# Patient Record
Sex: Female | Born: 1935 | Race: Black or African American | Hispanic: No | Smoking: Former smoker
Health system: Southern US, Community
[De-identification: ages and names within clinical notes are randomized; demographics above are authoritative.]

## PROBLEM LIST (undated history)

## (undated) DIAGNOSIS — I739 Peripheral vascular disease, unspecified: Secondary | ICD-10-CM

## (undated) DIAGNOSIS — E785 Hyperlipidemia, unspecified: Secondary | ICD-10-CM

## (undated) DIAGNOSIS — M1990 Unspecified osteoarthritis, unspecified site: Secondary | ICD-10-CM

## (undated) DIAGNOSIS — F0390 Unspecified dementia without behavioral disturbance: Secondary | ICD-10-CM

## (undated) DIAGNOSIS — E875 Hyperkalemia: Secondary | ICD-10-CM

## (undated) DIAGNOSIS — K219 Gastro-esophageal reflux disease without esophagitis: Secondary | ICD-10-CM

## (undated) DIAGNOSIS — R627 Adult failure to thrive: Secondary | ICD-10-CM

## (undated) DIAGNOSIS — F062 Psychotic disorder with delusions due to known physiological condition: Secondary | ICD-10-CM

## (undated) DIAGNOSIS — R296 Repeated falls: Secondary | ICD-10-CM

## (undated) DIAGNOSIS — I442 Atrioventricular block, complete: Secondary | ICD-10-CM

## (undated) DIAGNOSIS — D638 Anemia in other chronic diseases classified elsewhere: Secondary | ICD-10-CM

## (undated) DIAGNOSIS — K8020 Calculus of gallbladder without cholecystitis without obstruction: Secondary | ICD-10-CM

## (undated) DIAGNOSIS — I10 Essential (primary) hypertension: Secondary | ICD-10-CM

## (undated) DIAGNOSIS — E871 Hypo-osmolality and hyponatremia: Secondary | ICD-10-CM

## (undated) HISTORY — DX: Peripheral vascular disease, unspecified: I73.9

## (undated) HISTORY — PX: OTHER SURGICAL HISTORY: SHX169

## (undated) HISTORY — PX: PACEMAKER INSERTION: SHX728

## (undated) HISTORY — DX: Calculus of gallbladder without cholecystitis without obstruction: K80.20

## (undated) HISTORY — DX: Essential (primary) hypertension: I10

## (undated) HISTORY — DX: Gastro-esophageal reflux disease without esophagitis: K21.9

## (undated) HISTORY — DX: Unspecified osteoarthritis, unspecified site: M19.90

## (undated) HISTORY — DX: Hyperlipidemia, unspecified: E78.5

---

## 2000-11-05 ENCOUNTER — Ambulatory Visit (HOSPITAL_COMMUNITY): Admission: RE | Admit: 2000-11-05 | Discharge: 2000-11-05 | Payer: Self-pay | Admitting: Gastroenterology

## 2000-12-07 ENCOUNTER — Inpatient Hospital Stay (HOSPITAL_COMMUNITY): Admission: EM | Admit: 2000-12-07 | Discharge: 2000-12-08 | Payer: Self-pay | Admitting: *Deleted

## 2000-12-07 ENCOUNTER — Encounter: Payer: Self-pay | Admitting: Internal Medicine

## 2000-12-08 ENCOUNTER — Encounter: Payer: Self-pay | Admitting: Internal Medicine

## 2003-04-06 ENCOUNTER — Encounter (INDEPENDENT_AMBULATORY_CARE_PROVIDER_SITE_OTHER): Payer: Self-pay | Admitting: Gastroenterology

## 2003-04-13 ENCOUNTER — Encounter (INDEPENDENT_AMBULATORY_CARE_PROVIDER_SITE_OTHER): Payer: Self-pay | Admitting: Gastroenterology

## 2003-05-05 ENCOUNTER — Encounter: Admission: RE | Admit: 2003-05-05 | Discharge: 2003-05-05 | Payer: Self-pay | Admitting: Internal Medicine

## 2003-11-22 ENCOUNTER — Ambulatory Visit: Payer: Self-pay | Admitting: Internal Medicine

## 2003-12-06 ENCOUNTER — Ambulatory Visit: Payer: Self-pay | Admitting: Internal Medicine

## 2003-12-08 ENCOUNTER — Ambulatory Visit: Payer: Self-pay | Admitting: Internal Medicine

## 2003-12-20 ENCOUNTER — Ambulatory Visit: Payer: Self-pay | Admitting: Internal Medicine

## 2003-12-22 ENCOUNTER — Ambulatory Visit: Payer: Self-pay

## 2003-12-22 ENCOUNTER — Ambulatory Visit: Payer: Self-pay | Admitting: Internal Medicine

## 2004-01-04 ENCOUNTER — Ambulatory Visit: Admission: RE | Admit: 2004-01-04 | Discharge: 2004-01-04 | Payer: Self-pay | Admitting: Internal Medicine

## 2004-01-19 ENCOUNTER — Ambulatory Visit: Payer: Self-pay

## 2004-01-23 ENCOUNTER — Ambulatory Visit: Payer: Self-pay | Admitting: Internal Medicine

## 2004-01-30 ENCOUNTER — Ambulatory Visit: Payer: Self-pay | Admitting: Internal Medicine

## 2004-02-13 ENCOUNTER — Ambulatory Visit: Payer: Self-pay

## 2004-03-13 ENCOUNTER — Ambulatory Visit: Payer: Self-pay | Admitting: Internal Medicine

## 2004-03-18 ENCOUNTER — Ambulatory Visit: Payer: Self-pay | Admitting: Internal Medicine

## 2004-03-25 ENCOUNTER — Ambulatory Visit: Payer: Self-pay | Admitting: Internal Medicine

## 2004-04-12 ENCOUNTER — Encounter: Admission: RE | Admit: 2004-04-12 | Discharge: 2004-04-12 | Payer: Self-pay | Admitting: Nephrology

## 2004-04-16 ENCOUNTER — Ambulatory Visit: Payer: Self-pay | Admitting: Internal Medicine

## 2004-04-23 ENCOUNTER — Ambulatory Visit: Payer: Self-pay | Admitting: Internal Medicine

## 2004-07-17 ENCOUNTER — Ambulatory Visit: Payer: Self-pay | Admitting: Internal Medicine

## 2004-07-24 ENCOUNTER — Ambulatory Visit: Payer: Self-pay | Admitting: Internal Medicine

## 2004-08-20 ENCOUNTER — Ambulatory Visit: Payer: Self-pay | Admitting: Internal Medicine

## 2004-09-13 ENCOUNTER — Ambulatory Visit: Payer: Self-pay | Admitting: Internal Medicine

## 2004-09-25 ENCOUNTER — Ambulatory Visit: Payer: Self-pay | Admitting: Internal Medicine

## 2004-10-24 ENCOUNTER — Ambulatory Visit: Payer: Self-pay | Admitting: Internal Medicine

## 2004-11-25 ENCOUNTER — Ambulatory Visit: Payer: Self-pay | Admitting: Internal Medicine

## 2004-12-04 ENCOUNTER — Ambulatory Visit: Payer: Self-pay | Admitting: Internal Medicine

## 2005-01-13 DIAGNOSIS — I739 Peripheral vascular disease, unspecified: Secondary | ICD-10-CM

## 2005-01-13 HISTORY — DX: Peripheral vascular disease, unspecified: I73.9

## 2005-02-26 ENCOUNTER — Ambulatory Visit: Payer: Self-pay | Admitting: Internal Medicine

## 2005-03-18 ENCOUNTER — Ambulatory Visit: Payer: Self-pay | Admitting: Internal Medicine

## 2005-03-19 ENCOUNTER — Ambulatory Visit: Payer: Self-pay | Admitting: Internal Medicine

## 2005-03-27 ENCOUNTER — Ambulatory Visit: Payer: Self-pay

## 2005-04-17 ENCOUNTER — Ambulatory Visit: Payer: Self-pay | Admitting: Cardiology

## 2005-04-30 ENCOUNTER — Ambulatory Visit: Payer: Self-pay | Admitting: Internal Medicine

## 2005-05-09 ENCOUNTER — Ambulatory Visit: Payer: Self-pay | Admitting: Family Medicine

## 2005-06-11 ENCOUNTER — Ambulatory Visit: Payer: Self-pay | Admitting: Internal Medicine

## 2005-06-20 ENCOUNTER — Ambulatory Visit: Payer: Self-pay | Admitting: Cardiology

## 2005-07-02 ENCOUNTER — Ambulatory Visit (HOSPITAL_COMMUNITY): Admission: RE | Admit: 2005-07-02 | Discharge: 2005-07-03 | Payer: Self-pay | Admitting: Cardiology

## 2005-07-02 ENCOUNTER — Ambulatory Visit: Payer: Self-pay | Admitting: Cardiology

## 2005-07-08 ENCOUNTER — Ambulatory Visit: Payer: Self-pay | Admitting: Cardiology

## 2005-07-11 ENCOUNTER — Ambulatory Visit (HOSPITAL_COMMUNITY): Admission: RE | Admit: 2005-07-11 | Discharge: 2005-07-11 | Payer: Self-pay | Admitting: Cardiology

## 2005-07-11 ENCOUNTER — Ambulatory Visit: Payer: Self-pay | Admitting: Cardiology

## 2005-07-24 ENCOUNTER — Ambulatory Visit: Payer: Self-pay | Admitting: Cardiology

## 2005-07-24 ENCOUNTER — Ambulatory Visit: Payer: Self-pay

## 2005-07-28 ENCOUNTER — Ambulatory Visit: Payer: Self-pay

## 2005-09-12 ENCOUNTER — Ambulatory Visit: Payer: Self-pay | Admitting: Internal Medicine

## 2005-09-18 ENCOUNTER — Ambulatory Visit: Payer: Self-pay | Admitting: Internal Medicine

## 2005-10-29 ENCOUNTER — Ambulatory Visit: Payer: Self-pay | Admitting: Cardiology

## 2005-11-09 ENCOUNTER — Ambulatory Visit: Payer: Self-pay | Admitting: Internal Medicine

## 2005-11-24 ENCOUNTER — Ambulatory Visit: Payer: Self-pay | Admitting: Internal Medicine

## 2005-12-08 ENCOUNTER — Ambulatory Visit: Payer: Self-pay | Admitting: Internal Medicine

## 2006-01-05 ENCOUNTER — Ambulatory Visit: Payer: Self-pay | Admitting: Internal Medicine

## 2006-02-02 ENCOUNTER — Ambulatory Visit: Payer: Self-pay | Admitting: Internal Medicine

## 2006-02-12 ENCOUNTER — Ambulatory Visit: Payer: Self-pay

## 2006-03-02 ENCOUNTER — Ambulatory Visit: Payer: Self-pay | Admitting: Internal Medicine

## 2006-03-02 LAB — CONVERTED CEMR LAB
LDL Cholesterol: 125 mg/dL — ABNORMAL HIGH (ref 0–99)
Total CHOL/HDL Ratio: 3.9

## 2006-03-30 ENCOUNTER — Ambulatory Visit: Payer: Self-pay | Admitting: Internal Medicine

## 2006-04-27 ENCOUNTER — Ambulatory Visit: Payer: Self-pay | Admitting: Internal Medicine

## 2006-05-25 ENCOUNTER — Ambulatory Visit: Payer: Self-pay | Admitting: Internal Medicine

## 2006-06-12 DIAGNOSIS — Z950 Presence of cardiac pacemaker: Secondary | ICD-10-CM

## 2006-06-12 DIAGNOSIS — J309 Allergic rhinitis, unspecified: Secondary | ICD-10-CM | POA: Insufficient documentation

## 2006-06-12 DIAGNOSIS — K219 Gastro-esophageal reflux disease without esophagitis: Secondary | ICD-10-CM

## 2006-06-12 DIAGNOSIS — I10 Essential (primary) hypertension: Secondary | ICD-10-CM | POA: Insufficient documentation

## 2006-06-17 ENCOUNTER — Ambulatory Visit: Payer: Self-pay | Admitting: Internal Medicine

## 2006-06-17 DIAGNOSIS — Z8679 Personal history of other diseases of the circulatory system: Secondary | ICD-10-CM

## 2006-06-17 DIAGNOSIS — I739 Peripheral vascular disease, unspecified: Secondary | ICD-10-CM | POA: Insufficient documentation

## 2006-06-22 ENCOUNTER — Ambulatory Visit: Payer: Self-pay | Admitting: Internal Medicine

## 2006-07-06 ENCOUNTER — Telehealth (INDEPENDENT_AMBULATORY_CARE_PROVIDER_SITE_OTHER): Payer: Self-pay | Admitting: *Deleted

## 2006-07-20 ENCOUNTER — Ambulatory Visit: Payer: Self-pay | Admitting: Internal Medicine

## 2006-07-31 ENCOUNTER — Ambulatory Visit: Payer: Self-pay

## 2006-08-17 ENCOUNTER — Ambulatory Visit: Payer: Self-pay | Admitting: Internal Medicine

## 2006-08-28 ENCOUNTER — Encounter: Payer: Self-pay | Admitting: Internal Medicine

## 2006-09-15 ENCOUNTER — Ambulatory Visit: Payer: Self-pay | Admitting: Internal Medicine

## 2006-09-16 LAB — CONVERTED CEMR LAB
ALT: 14 units/L (ref 0–35)
AST: 25 units/L (ref 0–37)
BUN: 21 mg/dL (ref 6–23)
Basophils Absolute: 0 10*3/uL (ref 0.0–0.1)
Basophils Relative: 0.6 % (ref 0.0–1.0)
CO2: 25 meq/L (ref 19–32)
Calcium: 9.1 mg/dL (ref 8.4–10.5)
Chloride: 105 meq/L (ref 96–112)
Creatinine, Ser: 1 mg/dL (ref 0.4–1.2)
HCT: 35 % — ABNORMAL LOW (ref 36.0–46.0)
MCHC: 34.3 g/dL (ref 30.0–36.0)
Monocytes Relative: 9.9 % (ref 3.0–11.0)
Potassium: 4 meq/L (ref 3.5–5.1)
RBC: 3.94 M/uL (ref 3.87–5.11)
RDW: 12.9 % (ref 11.5–14.6)
WBC: 6.9 10*3/uL (ref 4.5–10.5)

## 2006-09-18 ENCOUNTER — Ambulatory Visit: Payer: Self-pay | Admitting: Internal Medicine

## 2006-09-22 ENCOUNTER — Ambulatory Visit: Payer: Self-pay | Admitting: Internal Medicine

## 2006-09-29 ENCOUNTER — Ambulatory Visit: Payer: Self-pay | Admitting: Internal Medicine

## 2006-09-29 LAB — CONVERTED CEMR LAB
BUN: 20 mg/dL (ref 6–23)
Basophils Relative: 1 % (ref 0.0–1.0)
CO2: 26 meq/L (ref 19–32)
Calcium: 8.9 mg/dL (ref 8.4–10.5)
Creatinine, Ser: 0.9 mg/dL (ref 0.4–1.2)
Eosinophils Relative: 3.2 % (ref 0.0–5.0)
GFR calc Af Amer: 77 mL/min
GFR calc non Af Amer: 64 mL/min
Hemoglobin: 11.3 g/dL — ABNORMAL LOW (ref 12.0–15.0)
INR: 1 (ref 0.8–1.0)
Lymphocytes Relative: 24.7 % (ref 12.0–46.0)
Monocytes Absolute: 0.6 10*3/uL (ref 0.2–0.7)
Monocytes Relative: 7.4 % (ref 3.0–11.0)
Neutro Abs: 5.1 10*3/uL (ref 1.4–7.7)
Prothrombin Time: 12.2 s (ref 10.9–13.3)
RDW: 12.7 % (ref 11.5–14.6)
WBC: 8.1 10*3/uL (ref 4.5–10.5)
aPTT: 23.9 s (ref 21.7–29.8)

## 2006-10-02 ENCOUNTER — Ambulatory Visit (HOSPITAL_COMMUNITY): Admission: RE | Admit: 2006-10-02 | Discharge: 2006-10-02 | Payer: Self-pay | Admitting: Internal Medicine

## 2006-10-02 ENCOUNTER — Ambulatory Visit: Payer: Self-pay | Admitting: Internal Medicine

## 2006-10-12 ENCOUNTER — Ambulatory Visit: Payer: Self-pay | Admitting: Internal Medicine

## 2006-10-16 ENCOUNTER — Ambulatory Visit: Payer: Self-pay | Admitting: Cardiovascular Disease

## 2006-12-18 ENCOUNTER — Ambulatory Visit: Payer: Self-pay | Admitting: Internal Medicine

## 2006-12-18 DIAGNOSIS — E785 Hyperlipidemia, unspecified: Secondary | ICD-10-CM

## 2007-02-04 ENCOUNTER — Ambulatory Visit: Payer: Self-pay | Admitting: Internal Medicine

## 2007-03-02 ENCOUNTER — Ambulatory Visit: Payer: Self-pay | Admitting: Internal Medicine

## 2007-03-04 ENCOUNTER — Telehealth (INDEPENDENT_AMBULATORY_CARE_PROVIDER_SITE_OTHER): Payer: Self-pay | Admitting: *Deleted

## 2007-03-05 ENCOUNTER — Ambulatory Visit: Payer: Self-pay | Admitting: Internal Medicine

## 2007-03-05 DIAGNOSIS — R42 Dizziness and giddiness: Secondary | ICD-10-CM

## 2007-03-09 ENCOUNTER — Encounter: Payer: Self-pay | Admitting: Internal Medicine

## 2007-03-09 ENCOUNTER — Telehealth: Payer: Self-pay | Admitting: Internal Medicine

## 2007-03-09 ENCOUNTER — Telehealth (INDEPENDENT_AMBULATORY_CARE_PROVIDER_SITE_OTHER): Payer: Self-pay | Admitting: *Deleted

## 2007-03-09 LAB — CONVERTED CEMR LAB
Basophils Relative: 1.6 % — ABNORMAL HIGH (ref 0.0–1.0)
CO2: 28 meq/L (ref 19–32)
Chloride: 108 meq/L (ref 96–112)
Eosinophils Absolute: 0.2 10*3/uL (ref 0.0–0.6)
Eosinophils Relative: 3 % (ref 0.0–5.0)
GFR calc Af Amer: 46 mL/min
GFR calc non Af Amer: 38 mL/min
Glucose, Bld: 76 mg/dL (ref 70–99)
HCT: 35.1 % — ABNORMAL LOW (ref 36.0–46.0)
Lymphocytes Relative: 27.4 % (ref 12.0–46.0)
MCV: 90.1 fL (ref 78.0–100.0)
Neutro Abs: 4.3 10*3/uL (ref 1.4–7.7)
Neutrophils Relative %: 59.1 % (ref 43.0–77.0)
Sodium: 141 meq/L (ref 135–145)
VLDL: 10 mg/dL (ref 0–40)
WBC: 7.2 10*3/uL (ref 4.5–10.5)

## 2007-03-11 ENCOUNTER — Encounter (INDEPENDENT_AMBULATORY_CARE_PROVIDER_SITE_OTHER): Payer: Self-pay | Admitting: *Deleted

## 2007-03-22 ENCOUNTER — Ambulatory Visit: Payer: Self-pay | Admitting: Cardiology

## 2007-03-23 ENCOUNTER — Encounter: Payer: Self-pay | Admitting: Internal Medicine

## 2007-03-23 ENCOUNTER — Ambulatory Visit: Payer: Self-pay

## 2007-03-25 ENCOUNTER — Telehealth (INDEPENDENT_AMBULATORY_CARE_PROVIDER_SITE_OTHER): Payer: Self-pay | Admitting: *Deleted

## 2007-03-26 ENCOUNTER — Ambulatory Visit: Payer: Self-pay | Admitting: Internal Medicine

## 2007-04-19 ENCOUNTER — Telehealth (INDEPENDENT_AMBULATORY_CARE_PROVIDER_SITE_OTHER): Payer: Self-pay | Admitting: *Deleted

## 2007-04-20 ENCOUNTER — Telehealth (INDEPENDENT_AMBULATORY_CARE_PROVIDER_SITE_OTHER): Payer: Self-pay | Admitting: *Deleted

## 2007-04-21 ENCOUNTER — Encounter (INDEPENDENT_AMBULATORY_CARE_PROVIDER_SITE_OTHER): Payer: Self-pay | Admitting: *Deleted

## 2007-05-22 ENCOUNTER — Emergency Department (HOSPITAL_COMMUNITY): Admission: EM | Admit: 2007-05-22 | Discharge: 2007-05-22 | Payer: Self-pay | Admitting: Emergency Medicine

## 2007-05-31 ENCOUNTER — Encounter: Payer: Self-pay | Admitting: Internal Medicine

## 2007-05-31 ENCOUNTER — Ambulatory Visit: Payer: Self-pay | Admitting: Internal Medicine

## 2007-06-14 ENCOUNTER — Encounter (INDEPENDENT_AMBULATORY_CARE_PROVIDER_SITE_OTHER): Payer: Self-pay | Admitting: *Deleted

## 2007-06-15 ENCOUNTER — Ambulatory Visit: Payer: Self-pay | Admitting: Internal Medicine

## 2007-06-15 DIAGNOSIS — M810 Age-related osteoporosis without current pathological fracture: Secondary | ICD-10-CM | POA: Insufficient documentation

## 2007-06-28 ENCOUNTER — Emergency Department (HOSPITAL_COMMUNITY): Admission: EM | Admit: 2007-06-28 | Discharge: 2007-06-28 | Payer: Self-pay | Admitting: Emergency Medicine

## 2007-07-01 ENCOUNTER — Telehealth: Payer: Self-pay | Admitting: Internal Medicine

## 2007-08-30 ENCOUNTER — Encounter: Payer: Self-pay | Admitting: Internal Medicine

## 2007-09-01 ENCOUNTER — Encounter (INDEPENDENT_AMBULATORY_CARE_PROVIDER_SITE_OTHER): Payer: Self-pay | Admitting: *Deleted

## 2007-10-18 ENCOUNTER — Ambulatory Visit: Payer: Self-pay | Admitting: Internal Medicine

## 2007-10-18 DIAGNOSIS — M1990 Unspecified osteoarthritis, unspecified site: Secondary | ICD-10-CM | POA: Insufficient documentation

## 2007-10-26 ENCOUNTER — Encounter (INDEPENDENT_AMBULATORY_CARE_PROVIDER_SITE_OTHER): Payer: Self-pay | Admitting: *Deleted

## 2007-10-26 LAB — CONVERTED CEMR LAB
CO2: 28 meq/L (ref 19–32)
GFR calc Af Amer: 61 mL/min
GFR calc non Af Amer: 50 mL/min
Glucose, Bld: 105 mg/dL — ABNORMAL HIGH (ref 70–99)
Potassium: 5 meq/L (ref 3.5–5.1)
Sed Rate: 44 mm/hr — ABNORMAL HIGH (ref 0–22)
Sodium: 140 meq/L (ref 135–145)

## 2007-11-03 ENCOUNTER — Ambulatory Visit: Payer: Self-pay | Admitting: Internal Medicine

## 2007-11-09 ENCOUNTER — Telehealth (INDEPENDENT_AMBULATORY_CARE_PROVIDER_SITE_OTHER): Payer: Self-pay | Admitting: *Deleted

## 2007-11-16 ENCOUNTER — Ambulatory Visit: Payer: Self-pay | Admitting: Cardiovascular Disease

## 2007-11-16 ENCOUNTER — Encounter (INDEPENDENT_AMBULATORY_CARE_PROVIDER_SITE_OTHER): Payer: Self-pay | Admitting: *Deleted

## 2007-12-02 ENCOUNTER — Encounter: Admission: RE | Admit: 2007-12-02 | Discharge: 2008-01-13 | Payer: Self-pay | Admitting: Internal Medicine

## 2007-12-10 ENCOUNTER — Encounter: Payer: Self-pay | Admitting: Internal Medicine

## 2008-02-21 ENCOUNTER — Telehealth (INDEPENDENT_AMBULATORY_CARE_PROVIDER_SITE_OTHER): Payer: Self-pay | Admitting: *Deleted

## 2008-02-22 ENCOUNTER — Encounter: Payer: Self-pay | Admitting: Internal Medicine

## 2008-03-03 ENCOUNTER — Ambulatory Visit: Payer: Self-pay | Admitting: Internal Medicine

## 2008-03-07 ENCOUNTER — Telehealth (INDEPENDENT_AMBULATORY_CARE_PROVIDER_SITE_OTHER): Payer: Self-pay | Admitting: *Deleted

## 2008-03-07 LAB — CONVERTED CEMR LAB
AST: 28 units/L (ref 0–37)
Basophils Absolute: 0.1 10*3/uL (ref 0.0–0.1)
Cholesterol: 187 mg/dL (ref 0–200)
LDL Cholesterol: 125 mg/dL — ABNORMAL HIGH (ref 0–99)
Lymphocytes Relative: 23 % (ref 12.0–46.0)
MCHC: 33.9 g/dL (ref 30.0–36.0)
Neutrophils Relative %: 65 % (ref 43.0–77.0)
RBC: 3.98 M/uL (ref 3.87–5.11)
RDW: 13.2 % (ref 11.5–14.6)
TSH: 1.6 microintl units/mL (ref 0.35–5.50)
Total CHOL/HDL Ratio: 3.7
Triglycerides: 60 mg/dL (ref 0–149)
VLDL: 12 mg/dL (ref 0–40)

## 2008-03-09 ENCOUNTER — Ambulatory Visit: Payer: Self-pay | Admitting: Internal Medicine

## 2008-03-13 ENCOUNTER — Telehealth (INDEPENDENT_AMBULATORY_CARE_PROVIDER_SITE_OTHER): Payer: Self-pay | Admitting: *Deleted

## 2008-03-14 LAB — CONVERTED CEMR LAB: Vit D, 25-Hydroxy: 46 ng/mL (ref 30–89)

## 2008-03-23 ENCOUNTER — Ambulatory Visit: Payer: Self-pay

## 2008-03-23 ENCOUNTER — Encounter: Payer: Self-pay | Admitting: Internal Medicine

## 2008-04-04 ENCOUNTER — Telehealth: Payer: Self-pay | Admitting: Internal Medicine

## 2008-04-05 ENCOUNTER — Encounter (INDEPENDENT_AMBULATORY_CARE_PROVIDER_SITE_OTHER): Payer: Self-pay | Admitting: *Deleted

## 2008-04-11 ENCOUNTER — Encounter: Payer: Self-pay | Admitting: Internal Medicine

## 2008-05-03 DIAGNOSIS — I442 Atrioventricular block, complete: Secondary | ICD-10-CM | POA: Insufficient documentation

## 2008-05-03 DIAGNOSIS — I498 Other specified cardiac arrhythmias: Secondary | ICD-10-CM

## 2008-08-09 ENCOUNTER — Ambulatory Visit: Payer: Self-pay | Admitting: Internal Medicine

## 2008-08-14 ENCOUNTER — Telehealth (INDEPENDENT_AMBULATORY_CARE_PROVIDER_SITE_OTHER): Payer: Self-pay | Admitting: *Deleted

## 2008-08-17 ENCOUNTER — Ambulatory Visit: Payer: Self-pay

## 2008-09-01 ENCOUNTER — Ambulatory Visit: Payer: Self-pay | Admitting: Internal Medicine

## 2008-10-11 ENCOUNTER — Telehealth: Payer: Self-pay | Admitting: Internal Medicine

## 2008-10-13 ENCOUNTER — Ambulatory Visit: Payer: Self-pay | Admitting: Internal Medicine

## 2008-10-18 ENCOUNTER — Telehealth (INDEPENDENT_AMBULATORY_CARE_PROVIDER_SITE_OTHER): Payer: Self-pay | Admitting: *Deleted

## 2008-10-24 ENCOUNTER — Ambulatory Visit: Payer: Self-pay | Admitting: Internal Medicine

## 2008-10-24 ENCOUNTER — Telehealth: Payer: Self-pay | Admitting: Internal Medicine

## 2008-10-24 LAB — CONVERTED CEMR LAB
Ketones, urine, test strip: NEGATIVE
Nitrite: NEGATIVE
Specific Gravity, Urine: 1.015
Urobilinogen, UA: 0.2
pH: 6

## 2008-11-13 ENCOUNTER — Ambulatory Visit: Payer: Self-pay | Admitting: Internal Medicine

## 2008-11-15 ENCOUNTER — Encounter: Payer: Self-pay | Admitting: Internal Medicine

## 2008-11-16 ENCOUNTER — Telehealth (INDEPENDENT_AMBULATORY_CARE_PROVIDER_SITE_OTHER): Payer: Self-pay | Admitting: *Deleted

## 2008-11-16 LAB — CONVERTED CEMR LAB
ALT: 10 units/L (ref 0–35)
AST: 31 units/L (ref 0–37)
Albumin: 4.2 g/dL (ref 3.5–5.2)
Alkaline Phosphatase: 63 units/L (ref 39–117)
Basophils Absolute: 0.1 10*3/uL (ref 0.0–0.1)
Calcium: 9.4 mg/dL (ref 8.4–10.5)
Creatinine, Ser: 1.1 mg/dL (ref 0.4–1.2)
Eosinophils Absolute: 0.1 10*3/uL (ref 0.0–0.7)
GFR calc non Af Amer: 60.75 mL/min (ref >60)
Lymphocytes Relative: 31.6 % (ref 12.0–46.0)
MCHC: 34.7 g/dL (ref 30.0–36.0)
Monocytes Relative: 3.3 % (ref 3.0–12.0)
Neutro Abs: 4 10*3/uL (ref 1.4–7.7)
Neutrophils Relative %: 62 % (ref 43.0–77.0)
RBC: 3.84 M/uL — ABNORMAL LOW (ref 3.87–5.11)
Total Protein: 7.8 g/dL (ref 6.0–8.3)
WBC: 6.5 10*3/uL (ref 4.5–10.5)

## 2008-12-13 ENCOUNTER — Ambulatory Visit: Payer: Self-pay | Admitting: Internal Medicine

## 2008-12-13 DIAGNOSIS — K8020 Calculus of gallbladder without cholecystitis without obstruction: Secondary | ICD-10-CM | POA: Insufficient documentation

## 2009-01-01 ENCOUNTER — Telehealth (INDEPENDENT_AMBULATORY_CARE_PROVIDER_SITE_OTHER): Payer: Self-pay | Admitting: *Deleted

## 2009-03-07 ENCOUNTER — Ambulatory Visit: Payer: Self-pay | Admitting: Internal Medicine

## 2009-03-27 ENCOUNTER — Ambulatory Visit: Payer: Self-pay | Admitting: Internal Medicine

## 2009-04-23 ENCOUNTER — Emergency Department (HOSPITAL_COMMUNITY): Admission: EM | Admit: 2009-04-23 | Discharge: 2009-04-23 | Payer: Self-pay | Admitting: Emergency Medicine

## 2009-04-24 ENCOUNTER — Telehealth (INDEPENDENT_AMBULATORY_CARE_PROVIDER_SITE_OTHER): Payer: Self-pay | Admitting: *Deleted

## 2009-04-24 ENCOUNTER — Ambulatory Visit: Payer: Self-pay | Admitting: Internal Medicine

## 2009-04-24 DIAGNOSIS — S20229A Contusion of unspecified back wall of thorax, initial encounter: Secondary | ICD-10-CM | POA: Insufficient documentation

## 2009-04-24 DIAGNOSIS — D649 Anemia, unspecified: Secondary | ICD-10-CM

## 2009-04-29 LAB — CONVERTED CEMR LAB
Basophils Absolute: 0 10*3/uL (ref 0.0–0.1)
Eosinophils Absolute: 0.2 10*3/uL (ref 0.0–0.7)
HCT: 32.6 % — ABNORMAL LOW (ref 36.0–46.0)
Hemoglobin: 11.1 g/dL — ABNORMAL LOW (ref 12.0–15.0)
Iron: 49 ug/dL (ref 42–145)
Lymphs Abs: 1.4 10*3/uL (ref 0.7–4.0)
MCHC: 34 g/dL (ref 30.0–36.0)
MCV: 89.2 fL (ref 78.0–100.0)
Monocytes Absolute: 0.3 10*3/uL (ref 0.1–1.0)
Neutro Abs: 1.5 10*3/uL (ref 1.4–7.7)
RDW: 14.5 % (ref 11.5–14.6)

## 2009-04-30 ENCOUNTER — Telehealth (INDEPENDENT_AMBULATORY_CARE_PROVIDER_SITE_OTHER): Payer: Self-pay | Admitting: *Deleted

## 2009-05-28 ENCOUNTER — Ambulatory Visit: Payer: Self-pay | Admitting: Internal Medicine

## 2009-05-30 LAB — CONVERTED CEMR LAB
Hemoglobin: 11.3 g/dL — ABNORMAL LOW (ref 12.0–15.0)
Total CK: 109 units/L (ref 7–177)

## 2009-06-04 ENCOUNTER — Encounter: Payer: Self-pay | Admitting: Internal Medicine

## 2009-06-13 ENCOUNTER — Encounter: Admission: RE | Admit: 2009-06-13 | Discharge: 2009-09-03 | Payer: Self-pay | Admitting: Family Medicine

## 2009-06-15 ENCOUNTER — Telehealth: Payer: Self-pay | Admitting: Internal Medicine

## 2009-06-15 ENCOUNTER — Ambulatory Visit: Payer: Self-pay | Admitting: Internal Medicine

## 2009-06-28 ENCOUNTER — Ambulatory Visit: Payer: Self-pay | Admitting: Internal Medicine

## 2009-06-28 ENCOUNTER — Telehealth (INDEPENDENT_AMBULATORY_CARE_PROVIDER_SITE_OTHER): Payer: Self-pay | Admitting: *Deleted

## 2009-07-04 ENCOUNTER — Telehealth: Payer: Self-pay | Admitting: Internal Medicine

## 2009-07-12 ENCOUNTER — Encounter: Payer: Self-pay | Admitting: Internal Medicine

## 2009-07-13 ENCOUNTER — Encounter: Payer: Self-pay | Admitting: Internal Medicine

## 2009-07-17 ENCOUNTER — Encounter: Payer: Self-pay | Admitting: Internal Medicine

## 2009-07-19 ENCOUNTER — Ambulatory Visit: Payer: Self-pay | Admitting: Internal Medicine

## 2009-07-23 ENCOUNTER — Telehealth: Payer: Self-pay | Admitting: Internal Medicine

## 2009-07-31 ENCOUNTER — Encounter: Payer: Self-pay | Admitting: Internal Medicine

## 2009-07-31 ENCOUNTER — Telehealth: Payer: Self-pay | Admitting: Internal Medicine

## 2009-08-01 ENCOUNTER — Ambulatory Visit: Payer: Self-pay | Admitting: Internal Medicine

## 2009-08-15 ENCOUNTER — Encounter: Payer: Self-pay | Admitting: Internal Medicine

## 2009-08-15 ENCOUNTER — Ambulatory Visit: Payer: Self-pay | Admitting: Family Medicine

## 2009-08-31 ENCOUNTER — Encounter: Payer: Self-pay | Admitting: Internal Medicine

## 2009-08-31 LAB — CONVERTED CEMR LAB
ALT: 13 units/L
AST: 31 units/L
B-12, serum: 221 pg/mL
Creatinine, Ser: 1 mg/dL
Glucose, Bld: 83 mg/dL
Hgb A1c MFr Bld: 5.3 %
Potassium, serum: 4.3 mmol/L
TSH: 1.26 microintl units/mL
Total Protein: 7.7 g/dL

## 2009-09-19 ENCOUNTER — Ambulatory Visit: Payer: Self-pay | Admitting: Internal Medicine

## 2009-09-19 DIAGNOSIS — E538 Deficiency of other specified B group vitamins: Secondary | ICD-10-CM

## 2009-09-20 ENCOUNTER — Telehealth: Payer: Self-pay | Admitting: Internal Medicine

## 2009-10-10 ENCOUNTER — Encounter: Payer: Self-pay | Admitting: Internal Medicine

## 2009-10-16 ENCOUNTER — Telehealth: Payer: Self-pay | Admitting: Internal Medicine

## 2009-11-05 ENCOUNTER — Ambulatory Visit: Payer: Self-pay | Admitting: Internal Medicine

## 2009-11-06 ENCOUNTER — Telehealth: Payer: Self-pay | Admitting: Internal Medicine

## 2009-11-12 ENCOUNTER — Telehealth: Payer: Self-pay | Admitting: Internal Medicine

## 2009-12-10 ENCOUNTER — Ambulatory Visit: Payer: Self-pay | Admitting: Internal Medicine

## 2010-01-16 ENCOUNTER — Encounter: Payer: Self-pay | Admitting: Internal Medicine

## 2010-01-16 ENCOUNTER — Ambulatory Visit: Admission: RE | Admit: 2010-01-16 | Discharge: 2010-01-16 | Payer: Self-pay | Source: Home / Self Care

## 2010-01-22 ENCOUNTER — Ambulatory Visit
Admission: RE | Admit: 2010-01-22 | Discharge: 2010-01-22 | Payer: Self-pay | Source: Home / Self Care | Attending: Internal Medicine | Admitting: Internal Medicine

## 2010-01-22 ENCOUNTER — Other Ambulatory Visit: Payer: Self-pay | Admitting: Internal Medicine

## 2010-01-22 LAB — BASIC METABOLIC PANEL
BUN: 21 mg/dL (ref 6–23)
CO2: 27 mEq/L (ref 19–32)
Calcium: 9.3 mg/dL (ref 8.4–10.5)
Chloride: 105 mEq/L (ref 96–112)
Creatinine, Ser: 0.9 mg/dL (ref 0.4–1.2)
GFR: 72.62 mL/min (ref >60.00)
Glucose, Bld: 91 mg/dL (ref 70–99)
Potassium: 4.6 mEq/L (ref 3.5–5.1)
Sodium: 137 mEq/L (ref 135–145)

## 2010-01-29 ENCOUNTER — Telehealth: Payer: Self-pay | Admitting: Internal Medicine

## 2010-01-31 ENCOUNTER — Telehealth: Payer: Self-pay | Admitting: Internal Medicine

## 2010-02-03 ENCOUNTER — Encounter: Payer: Self-pay | Admitting: Internal Medicine

## 2010-02-10 LAB — CONVERTED CEMR LAB
Basophils Relative: 0.6 % (ref 0.0–3.0)
CO2: 30 meq/L (ref 19–32)
Calcium: 9.3 mg/dL (ref 8.4–10.5)
Chloride: 106 meq/L (ref 96–112)
Creatinine, Ser: 1.1 mg/dL (ref 0.4–1.2)
Eosinophils Relative: 3.3 % (ref 0.0–5.0)
Glucose, Bld: 73 mg/dL (ref 70–99)
Hemoglobin: 11.7 g/dL — ABNORMAL LOW (ref 12.0–15.0)
Lymphocytes Relative: 27.5 % (ref 12.0–46.0)
MCHC: 34 g/dL (ref 30.0–36.0)
MCV: 90.1 fL (ref 78.0–100.0)
Monocytes Relative: 6.5 % (ref 3.0–12.0)
Neutro Abs: 3.7 10*3/uL (ref 1.4–7.7)
RBC: 3.82 M/uL — ABNORMAL LOW (ref 3.87–5.11)
RDW: 15.2 % — ABNORMAL HIGH (ref 11.5–14.6)
Sodium: 144 meq/L (ref 135–145)

## 2010-02-12 NOTE — Letter (Signed)
[facility]   Imported By: Lanelle Bal 11/14/2009 11:40:29  _____________________________________________________________________  External Attachment:    Type:   Image     Comment:   External Document

## 2010-02-12 NOTE — Assessment & Plan Note (Signed)
Summary: discuss back pain/referral?/drb   Vital Signs:  Patient profile:   77 year old female Weight:      144.50 pounds Pulse rate:   80 / minute Pulse rhythm:   regular BP sitting:   134 / 82  (left arm) Cuff size:   regular  Vitals Entered By: Army Fossa CMA (November 05, 2009 2:11 PM) CC: Pt here to discuss back pain.  Comments getting worse, feels in her legs and arms  not fasting [facility] [place] flu shot    History of Present Illness: here  w/ her nephew [name]  continue with generalized back pain, shoulder pain and pain in the left iliac crest when she bends forward.  also, have a lot of dizzines , saw neuro, w/u done, results? She likes me  to explain all the results of neuro work up to her  Current Medications (verified): 1)  Amlodipine Besylate 5 Mg Tabs (Amlodipine Besylate) .Marland Kitchen.. 1 By Mouth Once Daily 2)  Calcium and Vit D Every Day 3)  Prilosec Otc 20 Mg Tbec (Omeprazole Magnesium) .Marland Kitchen.. 1 By Mouth Once Daily 4)  B12 1000 Mcg .... Daily 5)  Baby Aspirin 81 Mg Chew (Aspirin) .Marland Kitchen.. 1 By Mouth Daily 6)  Advil 200 Mg Tabs (Ibuprofen) .... As Needed 7)  Fiber Caps  Allergies: 1)  ! Hydrocodone 2)  * Penicillins Group  Past History:  Past Medical History: Reviewed history from 06/28/2009 and no changes required. u/s 11-2008: (-) for AAA, does have GB stones Hypertension Hyperlipidemia Cardiomyopathy PACEMAKER, PERMANENT (ICD-V45.01) [employer] [phone]  PVD-- stenting b legs 2007 last carotid u/s 3-10  stable  Allergic rhinitis Diverticulosis, colon GERD Osteoporosis OA   End of life issues discussed 03-03-08: does not like heroic measures, intubation or CPR  Past Surgical History: Reviewed history from 06/12/2006 and no changes required. Permanent pacemaker Breast biopsy benign  Social History: Reviewed history from 03/07/2009 and no changes required. cames to the office frecuently w/ her nephew [name] lives by self ADL  independent still drives no children has family around Tobacco Use - Former.  quit age 86 ETOH--no  Retired  Widowed  Alcohol Use - no  Review of Systems       no fever, headache No weight loss  Physical Exam  General:  alert and well-developed.   Lungs:  normal respiratory effort, no intercostal retractions, no accessory muscle use, and normal breath sounds.   Heart:  normal rate, regular rhythm, no murmur, no gallop, and no rub.   Extremities:  no edema   Impression & Recommendations:  Problem # 1:  OSTEOARTHRITIS (ICD-715.90) continuous  generalized aches and pains likely from DJD She is not on statins workup included: 05-2009: Sedimentation rate was 34 (essentially normal), CK normal again, I told her that I cannot "cure" her DJD but we can make her better with pain medication She has consistently declined to take narcotics   The following medications were removed from the medication list:    Advil 200 Mg Tabs (Ibuprofen) .Marland Kitchen... As needed Her updated medication list for this problem includes:    Baby Aspirin 81 Mg Chew (Aspirin) .Marland Kitchen... 1 by mouth daily  Problem # 2:  DIZZINESS (ICD-780.4) patient likes me to find out about her carotid  ultrasound and nerve conduction study. We'll call neurology  Problem # 3:  OSTEOPOROSIS (ICD-733.00) last bone density 8-11--osteoporosis She does not like to take oral biphosphonate, IV biphosphonate very extensive, can't afford All her alternative options are equally  or more extensive. Recommend: Calcium, vitamin D, exercise.   Complete Medication List: 1)  Amlodipine Besylate 5 Mg Tabs (Amlodipine besylate) .Marland Kitchen.. 1 by mouth once daily 2)  Calcium and Vit D Every Day  3)  Prilosec Otc 20 Mg Tbec (Omeprazole magnesium) .Marland Kitchen.. 1 by mouth once daily 4)  B12 1000 Mcg  .... Daily 5)  Baby Aspirin 81 Mg Chew (Aspirin) .Marland Kitchen.. 1 by mouth daily 6)  Fiber Caps   Other Orders: Flu Vaccine 81yrs + MEDICARE PATIENTS (E4540) Administration Flu  vaccine - MCR (J8119)  Patient Instructions: 1)  Please schedule a follow-up appointment in 4 months .    Orders Added: 1)  Flu Vaccine 5yrs + MEDICARE PATIENTS [Q2039] 2)  Administration Flu vaccine - MCR [G0008] 3)  Est. Patient Level III [14782] Flu Vaccine Consent Questions     Do you have a history of severe allergic reactions to this vaccine? no    Any prior history of allergic reactions to egg and/or gelatin? no    Do you have a sensitivity to the preservative Thimersol? no    Do you have a past history of Guillan-Barre Syndrome? no    Do you currently have an acute febrile illness? no    Have you ever had a severe reaction to latex? no    Vaccine information given and explained to patient? yes    Are you currently pregnant? no    Lot Number:[id number]   Exp Date:07/13/2010   Site Given  right Deltoid IMcine 92yrs + MEDICARE PATIENTS [Q2039] 2)  Administration Flu vaccine - MCR [G0008]     .lbmedflu

## 2010-02-12 NOTE — Cardiovascular Report (Signed)
Summary: Office Visit   Office Visit   Imported By: Roderic Ovens 03/30/2009 16:19:57  _____________________________________________________________________  External Attachment:    Type:   Image     Comment:   External Document

## 2010-02-12 NOTE — Consult Note (Signed)
Summary: [facility]   Imported By: Lanelle Bal 06/16/2009 11:02:35  _____________________________________________________________________  External Attachment:    Type:   Image     Comment:   External Document

## 2010-02-12 NOTE — Progress Notes (Signed)
  Phone Note Call from Patient Call back at Home Phone [phone]   Summary of Call: Patient left message on VM on 4/11 (incorrect VM - Lisa).  Stating that she fell over the weekend & is having pain.  Wants to know if she should come in???  Called pt this am -- scheduled appt for this afternoon [facility]  April 24, 2009 8:55 AM

## 2010-02-12 NOTE — Assessment & Plan Note (Signed)
Summary: 1 MONTH FOLLOWUP///SPH   Vital Signs:  Patient profile:   77 year old female Height:      64 inches Weight:      144 pounds Temp:     98.2 degrees F oral Pulse rate:   68 / minute BP sitting:   140 / 70  (left arm)  Vitals Entered By: Jeremy Johann CMA (June 28, 2009 10:18 AM) CC: 1 month f/u  Comments --hip pain --fasting REVIEWED MED LIST, PATIENT AGREED DOSE AND INSTRUCTION CORRECT    History of Present Illness: here to report that she is doing physical therapy and needs a form signed. No further falls  Allergies: 1)  ! Hydrocodone 2)  * Penicillins Group  Past History:  Past Medical History: u/s 11-2008: (-) for AAA, does have GB stones Hypertension Hyperlipidemia Cardiomyopathy PACEMAKER, PERMANENT (ICD-V45.01) [employer] [phone]  PVD-- stenting b legs 2007 last carotid u/s 3-10  stable  Allergic rhinitis Diverticulosis, colon GERD Osteoporosis OA   End of life issues discussed 03-03-08: does not like heroic measures, intubation or CPR  Past Surgical History: Reviewed history from 06/12/2006 and no changes required. Permanent pacemaker Breast biopsy benign  Review of Systems       denies chest pain, shortness of breath No nausea vomiting or diarrhea  Physical Exam  General:  alert and well-developed.   Lungs:  normal respiratory effort, no intercostal retractions, no accessory muscle use, and normal breath sounds.   Heart:  normal rate, regular rhythm, no murmur, no gallop, and no rub.     Impression & Recommendations:  Problem # 1:  ACCIDENTAL FALLS, RECURRENT (ICD-E888.9) doing physical therapy, will bring a form to be signed.  Problem # 2:  DIZZINESS (ICD-780.4) to be seen by cardiology 07-19-09, patient aware  Complete Medication List: 1)  Amlodipine Besylate 5 Mg Tabs (Amlodipine besylate) .Marland Kitchen.. 1 by mouth once daily 2)  Bayer Aspirin Ec Low Dose 81 Mg Tbec (Aspirin) .Marland Kitchen.. 1 a   day 3)  Calcium and Vit D Every Day  4)   Prilosec Otc 20 Mg Tbec (Omeprazole magnesium) .Marland Kitchen.. 1 by mouth once daily  Patient Instructions: 1)  Please schedule a follow-up appointment in 3 months .

## 2010-02-12 NOTE — Assessment & Plan Note (Signed)
Summary: fell/swh   Vital Signs:  Patient profile:   77 year old female Height:      64 inches Weight:      145.8 pounds Pulse rate:   76 / minute BP sitting:   140 / 80  Vitals Entered By: Shary Decamp (April 24, 2009 2:28 PM) CC: not feeling well x 1 month, fell on 4/8 because she felt "woozy", went to [facility] yesterday -- wkup was normal   History of Present Illness: not feeling well x 1 month, can not  describe exactly what she means, apparently is mostly dizziness two days ago she fell  backwards and landed on her right side buttock   ROS denies loss of consciousness no chest pain, shortness of breath, palpitations no headaches no bladder or bowel incontinence, no tongue bite  again she thinks that she was just dizzy. Since then she is having back pain, went to the ER yesterday E R records reviewed: thoracic spine x-rays:   No acute skeletal abnormalities.  Mild to moderate diffuse thoracic spondylosis.  Osteopenia.  today the pain is better    Current Medications (verified): 1)  Amlodipine Besylate 5 Mg Tabs (Amlodipine Besylate) .Marland Kitchen.. 1 By Mouth Once Daily 2)  Bayer Aspirin Ec Low Dose 81 Mg Tbec (Aspirin) .Marland Kitchen.. 1 A   Day 3)  Calcium and Vit D Every Day 4)  Prilosec Otc 20 Mg Tbec (Omeprazole Magnesium) .Marland Kitchen.. 1 By Mouth Once Daily  Allergies (verified): 1)  ! Hydrocodone 2)  * Penicillins Group  Past History:  Past Medical History: Reviewed history from 03/07/2009 and no changes required. u/s 11-2008: (-) for AAA, does have GB stones Hypertension Hyperlipidemia Cardiomyopathy PACEMAKER, PERMANENT (ICD-V45.01) [employer] [phone]  PVD-- stenting b legs 2007 last carotid u/s 3-10  stable  Allergic rhinitis Diverticulosis, colon GERD Osteoporosis OA   End of life issues discussed 03-03-18: does not like heroic measures, intubation or CPR  Past Surgical History: Reviewed history from 06/12/2006 and no changes required. Permanent pacemaker Breast  biopsy benign  Social History: Reviewed history from 03/07/2009 and no changes required. lives by self ADL independent still drives no children has family around Tobacco Use - Former.  quit age 15 ETOH--no  Retired  Widowed  Alcohol Use - no  Review of Systems      See HPI GI:  Denies abdominal pain, bloody stools, diarrhea, nausea, and vomiting.  Physical Exam  General:  alert and well-developed.   Lungs:  normal respiratory effort, no intercostal retractions, no accessory muscle use, and normal breath sounds.   Heart:  normal rate, regular rhythm, no murmur, no gallop, and no rub.   Extremities:  no edema Neurologic:  alert & oriented X3, strength normal in all extremities, and gait normal.   she got  into the examining room, sat down and then got up in the examining table without problems Psych:  not anxious appearing and not depressed appearing.     Impression & Recommendations:  Problem # 1:  CONTUSION OF BACK (ICD-922.31) status post a fall two days ago, no true syncope review of systems essentially negative apparently she was dizzy, , this is a ongoing problem   last carotid ultrasound 03/2008, essentially normal she has a pacemaker  , it was checked on 03/27/09, apparently was working fine and that time. plan: check a Hg-- 10.5, see below Observation, call if dizzines worsen  we will let cardiology know about this event also she was taking vicodin which  may increse dizzines , change to tylenol   Problem # 2:  ANEMIA (ICD-285.9)  mild anemia on screening Hg labs   Orders: Venipuncture (16109) TLB-CBC Platelet - w/Differential (85025-CBCD) TLB-IBC Pnl (Iron/FE;Transferrin) (83550-IBC) TLB-B12 + Folate Pnl (60454_09811-B14/NWG)  Complete Medication List: 1)  Amlodipine Besylate 5 Mg Tabs (Amlodipine besylate) .Marland Kitchen.. 1 by mouth once daily 2)  Bayer Aspirin Ec Low Dose 81 Mg Tbec (Aspirin) .Marland Kitchen.. 1 a   day 3)  Calcium and Vit D Every Day  4)  Prilosec Otc 20 Mg  Tbec (Omeprazole magnesium) .Marland Kitchen.. 1 by mouth once daily  Patient Instructions: 1)  no vicodin, j ust take tylenol for pain \ Laboratory Results   CBC   HGB:  10.5 g/dL   (Normal Range: 95.6-21.3 in Males, 12.0-15.0 in Females)    Appended Document: fell/swh cards agreed w/ my assesment via flag

## 2010-02-12 NOTE — Miscellaneous (Signed)
Summary: labs from neurology   Clinical Lists Changes  Observations: Added new observation of B-12: 221 pg/mL (08/31/2009 14:51) Added new observation of TSH: 1.26 microintl units/mL (08/31/2009 14:51) Added new observation of SGPT (ALT): 13 units/L (08/31/2009 14:51) Added new observation of SGOT (AST): 31 units/L (08/31/2009 14:51) Added new observation of PROTEIN, TOT: 7.7 g/dL (69/62/9528 41:32) Added new observation of HGBA1C: 5.3 % (08/31/2009 14:51) Added new observation of GLUCOSE SER: 83 mg/dL (44/01/270 53:66) Added new observation of CREATININE: 1.0 mg/dL (44/03/4740 59:56) Added new observation of POTASSIUM: 4.3 mmol/L (08/31/2009 14:51)  done 07/13/09. Army Fossa CMA  August 31, 2009 2:53 PM

## 2010-02-12 NOTE — Progress Notes (Signed)
Summary: no answer, 4/18  Phone Note Call from Patient Call back at Home Phone [phone]   Details for Reason: PATIENT LEFT MESSAGE ON VM: PATIENT FELL  Summary of Call: Patient jumped up out of chair & fell.  Has a few bruises on her arm.  No syncope, No LOC.  No other sxs.  Advised to monitor. Call with increased sxs or concerns Shary Decamp  April 30, 2009 11:05 AM   Follow-up for Phone Call        please arrange a PT/OT referral Follow-up by: [facility] E. Paz MD,  April 30, 2009 11:28 AM  Additional Follow-up for Phone Call Additional follow up Details #1::        no answer Shary Decamp  April 30, 2009 3:49 PM discussed with pt, home health referral done Shary Decamp  May 02, 2009 1:26 PM   New Problems: ACCIDENTAL FALLS, RECURRENT (ICD-E888.9)   New Problems: ACCIDENTAL FALLS, RECURRENT (ICD-E888.9)

## 2010-02-12 NOTE — Assessment & Plan Note (Signed)
Summary: rov/jml    Visit Type:  Follow-up Primary Provider:  Nolon Rod. Paz MD   History of Present Illness: Ms. [name] returns today for followup.  She is a very pleasant elderly woman with complete heart block and pacemaker, also with hypertension. She has fairly severe arthritis.  She denies chest pain or shortness ofbreath.  I saw her several months ago and she was doing well.  Over the past few weeks, she has had problems where she would fall when she stood up quickly.  This was intermittent, not associated with c/p or sob or peripheral edema.  She has not had syncope.  Allergies: 1)  ! Hydrocodone 2)  * Penicillins Group  Past History:  Past Medical History: Last updated: 06/28/2009 u/s 11-2008: (-) for AAA, does have GB stones Hypertension Hyperlipidemia Cardiomyopathy PACEMAKER, PERMANENT (ICD-V45.01) [employer] [phone]  PVD-- stenting b legs 2007 last carotid u/s 3-10  stable  Allergic rhinitis Diverticulosis, colon GERD Osteoporosis OA   End of life issues discussed 03-03-08: does not like heroic measures, intubation or CPR  Past Surgical History: Last updated: 06/12/2006 Permanent pacemaker Breast biopsy benign  Review of Systems  The patient denies chest pain, syncope, dyspnea on exertion, and peripheral edema.    Vital Signs:  Patient profile:   77 year old female Height:      64 inches Weight:      143 pounds BMI:     24.63 Pulse rate:   72 / minute BP sitting:   146 / 70  (left arm)  Vitals Entered By: Laurance Flatten CMA (July 19, 2009 3:00 PM)  Physical Exam  General:  alert and well-developed.   Head:  normocephalic and atraumatic.   Mouth:  pharynx pink and moist, no erythema, and no exudates.   Neck:  no masses and normal carotid upstroke.   Chest Wall:  well healed PPM incision. Lungs:  normal respiratory effort, no intercostal retractions, no accessory muscle use, and normal breath sounds.   Heart:  normal rate, regular rhythm, no  murmur, no gallop, and no rub.   Abdomen:  soft, non-tender, normal bowel sounds, no distention, no masses, no guarding, and no rigidity.   Pulses:  normal femoral pulses bilaterally Extremities:  no edema Neurologic:  speech is fluent and clear Face symmetric External ocular movements intact Motor symmetric    PPM Specifications Following MD:  Lewayne Bunting, MD     PPM Vendor:  [facility] [employer]     PPM Model Number:  [id number]     PPM Serial Number:  [id number] PPM DOI:  10/02/2006      Lead 1    Location: RA     DOI: 03/24/2000     Model #: [id number]     Serial #: [id number]     Status: active Lead 2    Location: RV     DOI: 03/24/2000     Model #: [id number]     Serial #: [id number]     Status: active  Magnet Response Rate:  BOL 100 ERI  85  Indications:  CHB   PPM Follow Up Remote Check?  No Battery Voltage:  good V     Battery Est. Longevity:  >5 years     Pacer Dependent:  Yes       PPM Device Measurements Atrium  Amplitude: 1.9 mV, Impedance: 470 ohms, Threshold: 0.9 V at 0.4 msec Right Ventricle  Impedance: 520 ohms, Threshold: 1.1 V at 0.4 msec  Episodes  MS Episodes:  1     Percent Mode Switch:  <1%     Coumadin:  No Atrial Pacing:  17%     Ventricular Pacing:  100%  Parameters Mode:  DDD     Lower Rate Limit:  60     Upper Rate Limit:  120 Paced AV Delay:  300     Sensed AV Delay:  250 Tech Comments:  No parameter changes. Interrogation only for syncopal episodes.  Follow up as scheduled. Altha Harm, LPN  July 20, 6043 3:22 PM  MD Comments:  Her device is working normally.    Impression & Recommendations:  Problem # 1:  ACCIDENTAL FALLS, RECURRENT (ICD-E888.9) These are not due to PM malfunction. At her advanced age, I suspect she has some orthostasis.  I have asked her to make sure she does not miss any meals and to refrain from getting up too quickly.  Problem # 2:  PACEMAKER, PERMANENT (ICD-V45.01) Her device is working normally.  Wiill recheck in several months.  Problem # 3:   HYPERTENSION (ICD-401.9) Her blood pressure is stable.  I do not want to keep her pressures too well controlled out of concern for making her more likely to be orthostatic.  Patient Instructions: 1)  Your physician wants you to follow-up in: 6 months with device clinic, 12 months with Dr Ladona Ridgel.  You will receive a reminder letter in the mail two months in advance. If you don't receive a letter, please call our office to schedule the follow-up appointment.

## 2010-02-12 NOTE — Cardiovascular Report (Signed)
Summary: Office Visit   Office Visit   Imported By: Roderic Ovens 07/23/2009 10:56:39  _____________________________________________________________________  External Attachment:    Type:   Image     Comment:   External Document

## 2010-02-12 NOTE — Progress Notes (Signed)
Summary: appt   Phone Note Call from Patient Call back at Home Phone [phone]   Caller: Patient Reason for Call: Talk to Nurse Summary of Call: pt has been falling.... is know taking rehab, rehab thinks she needs to be seen asap; they think that her heart could be the reason she keeps falling Initial call taken by: Migdalia Dk,  June 15, 2009 8:13 AM  Follow-up for Phone Call        to sch next aval apt with Dr Ladona Ridgel Follow-up by: Dennis Bast, RN, BSN,  June 19, 2009 5:08 PM

## 2010-02-12 NOTE — Progress Notes (Signed)
Summary: FYI  Phone Note Call from Patient Call back at Home Phone [phone]   Caller: Patient Summary of Call: Pt states that she received 2 letters stating that it was denied for her to see the Neurologist and then she received one that she was approved.  She states that she spoke with her insurance and they state it is fine for her to see the neurologist. Pt states she is still having problems with her side. Would like to see Dr.Uzma Hellmer. I tried to give pt an appt for Wednesday the 20th at 11 am she states that she will have to talk with her therapy to see if they will reschedule and she will call back to make the appt. Army Fossa CMA  July 31, 2009 3:13 PM   Follow-up for Phone Call        Pt has appt tomorrow at 11 am. Army Fossa CMA  July 31, 2009 4:00 PM

## 2010-02-12 NOTE — Progress Notes (Signed)
Summary: PAPERWORK FROM INS COMPANY ABOUT PT  Phone Note Call from Patient Call back at Home Phone [phone]   Caller: Patient Summary of Call: PATIENT BROUGHT IN PAPERWORK FROM HER INSURANCE COMPANY STATING SHE WAS APPROVED FOR PT EVALUATION AND EXERCISES----WILL TAKE THIS PAPERWORK TO [place] TO GIVE TO DR PAZ  PATIENT STATES THAT DR PAZ NEEDS TO SEE THIS AS INSTRUCTED BY HER INSURANCE COMPANY Initial call taken by: Jerolyn Shin,  June 28, 2009 2:37 PM  Follow-up for Phone Call        form scan to chart.Marti Sleigh Deloach CMA  June 29, 2009 8:51 AM

## 2010-02-12 NOTE — Progress Notes (Signed)
Summary: cannot afford reclast, Will discuss on return to the office  Phone Note From Other Clinic   Caller: Reclast Summary of Call: I spoke with [facility] and pt is going to have to pay a speciality fee and then 200+ dollars for the Reclast. Pt cannot afford reclast. Any other options for pt? Army Fossa CMA  October 16, 2009 10:10 AM   Follow-up for Phone Call        try the [facility] or [facility]  hospital  Follow-up by: [facility] E. Paz MD,  October 17, 2009 4:54 PM  Additional Follow-up for Phone Call Additional follow up Details #1::        [facility] and [facility] are expensive for the pt to. Her insurance only covers 80% and she has to pay the remaining 20%. Which is a rough $240. Additional Follow-up by: Army Fossa CMA,  October 18, 2009 8:25 AM    Additional Follow-up for Phone Call Additional follow up Details #2::    I doubt other options are less expensive  Plan: Will revisit the issue when she comes back Follow-up by: Jose E. Paz MD,  October 18, 2009 9:40 AM

## 2010-02-12 NOTE — Letter (Signed)
Summary: [facility]  [facility]   Imported By: Roderic Ovens 04/13/2009 10:03:34  _____________________________________________________________________  External Attachment:    Type:   Image     Comment:   External Document

## 2010-02-12 NOTE — Progress Notes (Signed)
Summary: b12  Phone Note Call from Patient   Caller: Patient Summary of Call: Pt called and said that Dr.Kirby stated she needs b12. She would like to know what would you prefer her to do the injections or the nasal spray. Please advise. Army Fossa CMA  July 23, 2009 3:16 PM   Follow-up for Phone Call        please get labs  from Dr. Craige Cotta before I start therapy Darwin Rothlisberger E. Siobhan Zaro MD  July 24, 2009 10:00 AM   Additional Follow-up for Phone Call Additional follow up Details #1::        I have called Dr.Kirby and requested labs. Army Fossa CMA  July 24, 2009 10:15 AM labs neurology review it B12 was 221, normal 182 to 914 hemoglobin A1c was 5.3 TSH was 1.2 Plan: Start vitamin B12 over-the-counter 1000 micrograms daily Recheck B12 from return to the office. Merikay Lesniewski E. Kathi Dohn MD  July 25, 2009 12:42 PM of sleep     Additional Follow-up for Phone Call Additional follow up Details #2::    I placed another call to Dr.Kirbys office and they are faxing labs now.Army Fossa CMA  July 25, 2009 9:58 AM  labs on [place]. Please advise. Army Fossa CMA  July 25, 2009 10:23 AM   Additional Follow-up for Phone Call Additional follow up Details #3:: Details for Additional Follow-up Action Taken: I spoke with pt she is aware of meds to get and rechecking. Army Fossa CMA  July 25, 2009 12:52 PM   New/Updated Medications: * B12 1000 MCG daily

## 2010-02-12 NOTE — Assessment & Plan Note (Signed)
Summary: hip pain/swh   Vital Signs:  Patient profile:   77 year old female Height:      64 inches Weight:      145 pounds BMI:     24.98 O2 Sat:      92 % on Room air Temp:     97 degrees F Pulse rate:   84 / minute BP sitting:   132 / 80  Vitals Entered By: Shary Decamp (May 28, 2009 3:00 PM)  O2 Flow:  Room air CC: not feeling well, "woozy", constipated x 3 days, achy   History of Present Illness: her chief complaint today is pain The pain is mostly at the upper extremities including shoulders, elbows. She still have a sharp pain at the left iliac crest when she bends over  She had another fall 2 weeks ago, did not go to the ER States that she was sleeping in a chair, the phone rang, she went to pick it up, she was half asleep, then suddenly fell No chest pain or palpitations at the time of the event, no loss of consciousness, denies headaches or neck pains  review of systems: Denies fevers, weight loss, headaches She started to take Vicodin 2.5 mg almost every 4 hours as needed, has noted some constipation since then   Current Medications (verified): 1)  Amlodipine Besylate 5 Mg Tabs (Amlodipine Besylate) .Marland Kitchen.. 1 By Mouth Once Daily 2)  Bayer Aspirin Ec Low Dose 81 Mg Tbec (Aspirin) .Marland Kitchen.. 1 A   Day 3)  Calcium and Vit D Every Day 4)  Prilosec Otc 20 Mg Tbec (Omeprazole Magnesium) .Marland Kitchen.. 1 By Mouth Once Daily 5)  Hydrocodone-Acetaminophen 2.5-500 Mg Tabs (Hydrocodone-Acetaminophen)  Allergies (verified): 1)  ! Hydrocodone 2)  * Penicillins Group  Past History:  Past Medical History: Reviewed history from 03/07/2009 and no changes required. u/s 11-2008: (-) for AAA, does have GB stones Hypertension Hyperlipidemia Cardiomyopathy PACEMAKER, PERMANENT (ICD-V45.01) [employer] [phone]  PVD-- stenting b legs 2007 last carotid u/s 3-10  stable  Allergic rhinitis Diverticulosis, colon GERD Osteoporosis OA   End of life issues discussed 03-03-18: does not  like heroic measures, intubation or CPR  Past Surgical History: Reviewed history from 06/12/2006 and no changes required. Permanent pacemaker Breast biopsy benign  Review of Systems      See HPI  Physical Exam  General:  alert and well-developed.   Lungs:  normal respiratory effort, no intercostal retractions, no accessory muscle use, and normal breath sounds.   Heart:  normal rate, regular rhythm, no murmur, no gallop, and no rub.   Msk:  inspection and palpation of the wrists and hands showed no synovitis. Palpation of the muscles of the upper extremities cause questionable discomfort Extremities:  no edema Neurologic:  alert & oriented X3, strength normal in all extremities, and gait normal.     Psych:  not anxious appearing and not depressed appearing.     Impression & Recommendations:  Problem # 1:  ACCIDENTAL FALLS, RECURRENT (ICD-E888.9)  see HPI seems that she had a mechanical fall Was referred to PT OT--- apparently was not eval  she has also chronic dizziness, does not describe dizziness at the time of the fall She also has a pacemaker which was checked recently and he was okay. Plan: will contact PT again, needs eval either in house or ambulatory  Orders: Physical Therapy Referral (PT)  Problem # 2:  OSTEOARTHRITIS (ICD-715.90) she continued to complain of upper extremity pain also LE pain : "  only when the weather changes" She started to take hydrocodone almost 4 times a day and now is constipated she also continued with the pain at the  left  iliac crest felt to be due to the rib border making contact w/   the pelvis. Plan: Recommend to stick to Tylenol and take hydrocodone only if the pain is severe no more than twice a day, see instructions Will check a sedimentation rate, total CK re -refer to ortho--- ?brace  Her updated medication list for this problem includes:    Bayer Aspirin Ec Low Dose 81 Mg Tbec (Aspirin) .Marland Kitchen... 1 a   day     Hydrocodone-acetaminophen 2.5-500 Mg Tabs (Hydrocodone-acetaminophen)  Orders: Venipuncture (18299) TLB-Sedimentation Rate (ESR) (85652-ESR) TLB-CK Total Only(Creatine Kinase/CPK) (82550-CK) Orthopedic Referral (Ortho)  Problem # 3:  ANEMIA (ICD-285.9)  last hemoglobin is slightly low, no iron deficiency. Plan: Check a hemoglobin  Orders: TLB-Hemoglobin (Hgb) (85018-HGB)  Complete Medication List: 1)  Amlodipine Besylate 5 Mg Tabs (Amlodipine besylate) .Marland Kitchen.. 1 by mouth once daily 2)  Bayer Aspirin Ec Low Dose 81 Mg Tbec (Aspirin) .Marland Kitchen.. 1 a   day 3)  Calcium and Vit D Every Day  4)  Prilosec Otc 20 Mg Tbec (Omeprazole magnesium) .Marland Kitchen.. 1 by mouth once daily 5)  Hydrocodone-acetaminophen 2.5-500 Mg Tabs (Hydrocodone-acetaminophen)  Patient Instructions: 1)  take tylenol for pain 2)  if the pain is severe, take hydrocodone, don't take more than 2 hydrocodones a day  3)  Please schedule a follow-up appointment in 1 month.

## 2010-02-12 NOTE — Assessment & Plan Note (Signed)
Summary: DISCUSS RECENT FALLS//FD   Vital Signs:  Patient profile:   77 year old female Height:      64 inches Weight:      141 pounds O2 Sat:      98 % on Room air Temp:     98.0 degrees F oral Pulse rate:   73 / minute BP sitting:   150 / 58  (left arm)  Vitals Entered By: Jeremy Johann CMA (June 15, 2009 11:07 AM)  O2 Flow:  Room air CC: discuss recent falls Comments REVIEWED MED LIST, PATIENT AGREED DOSE AND INSTRUCTION CORRECT    History of Present Illness: here for evaluation of frequent falls She fell  early April and early May, no more falls since then. She is doing rehabilitation for frequent falls, the physical therapist felt that she needed to be evaluated because of dizziness.  ROS On further questioning, she has been dizzy for years, states that symptoms are no better or worse, symptoms are on and off. Whenever she feels dizzy she does not have nausea, headache, diplopia, slurred speech or focal deficits  Allergies: 1)  ! Hydrocodone 2)  * Penicillins Group  Social History: Reviewed history from 03/07/2009 and no changes required. lives by self ADL independent still drives no children has family around Tobacco Use - Former.  quit age 3 ETOH--no  Retired  Widowed  Alcohol Use - no  Review of Systems      See HPI       denies chest pain or shortness of breath  Physical Exam  General:  alert and well-developed.   Lungs:  normal respiratory effort, no intercostal retractions, no accessory muscle use, and normal breath sounds.   Heart:  normal rate, regular rhythm, no murmur, no gallop, and no rub.   Extremities:  no edema Neurologic:  speech is fluent and clear Face symmetric External ocular movements intact Motor symmetric Reflexes symmetric Romberg absent   Impression & Recommendations:  Problem # 1:  DIZZINESS (ICD-780.4)  the patient continue with chronic dizziness, she fell  early April and early May extensive chart review: Last  brain imaging was CT of the head without contrast 03/2007: No acute Last echo was 03-2007 essentially normal Labs carotid ultrasound 03-2008: Stable, 0-39% blockage Her pacemaker has been checked at normal intervals and there was no apparent problems. Plan: EKG today showed no acute changes, no previous EKGs available ( will  CC to Dr. Ladona Ridgel) Agree with a non-emergetnt  cardiology evaluation Will also refer to neurology (Unable to do a MRI of the brain due to pacemaker)  Orders: Venipuncture (16109) TLB-BMP (Basic Metabolic Panel-BMET) (80048-METABOL) TLB-CBC Platelet - w/Differential (85025-CBCD) EKG w/ Interpretation (93000) Neurology Referral (Neuro)  Problem # 2:  ACCIDENTAL FALLS, RECURRENT (ICD-E888.9)  see #1 patient will call if has any other fall  Orders: EKG w/ Interpretation (93000) Neurology Referral (Neuro)  Problem # 3:  HYPERTENSION (ICD-401.9) no change for now Her updated medication list for this problem includes:    Amlodipine Besylate 5 Mg Tabs (Amlodipine besylate) .Marland Kitchen... 1 by mouth once daily  BP today: 150/58 Prior BP: 132/80 (05/28/2009)  Labs Reviewed: K+: 4.6 (11/13/2008) Creat: : 1.1 (11/13/2008)   Chol: 183 (03/07/2009)   HDL: 61.50 (03/07/2009)   LDL: 109 (03/07/2009)   TG: 61.0 (03/07/2009)  Complete Medication List: 1)  Amlodipine Besylate 5 Mg Tabs (Amlodipine besylate) .Marland Kitchen.. 1 by mouth once daily 2)  Bayer Aspirin Ec Low Dose 81 Mg Tbec (Aspirin) .Marland KitchenMarland KitchenMarland Kitchen 1  a   day 3)  Calcium and Vit D Every Day  4)  Prilosec Otc 20 Mg Tbec (Omeprazole magnesium) .Marland Kitchen.. 1 by mouth once daily  Patient Instructions: 1)  Please schedule a follow-up appointment in 3 months .

## 2010-02-12 NOTE — Assessment & Plan Note (Signed)
Summary: generalized aches and pains/kn   Vital Signs:  Patient profile:   77 year old female Weight:      146 pounds Pulse rate:   82 / minute Pulse rhythm:   regular BP sitting:   138 / 84  (left arm) Cuff size:   regular  Vitals Entered By: Army Fossa CMA (August 01, 2009 11:01 AM) CC: Pt here c/o pain all over. Comments - states it is coming from her side.    History of Present Illness: here w/ nephew [name] her main complaint today is pain States that she is aching all over, muscles and joints. The pain is at  the torso, extremities, shoulders, hips.  ROS No fever No headache or weight loss No rash Still dizzy, no further falls Just saw cardiology 7-11 , good reports   Allergies: 1)  ! Hydrocodone 2)  * Penicillins Group  Past History:  Past Medical History: Reviewed history from 06/28/2009 and no changes required. u/s 11-2008: (-) for AAA, does have GB stones Hypertension Hyperlipidemia Cardiomyopathy PACEMAKER, PERMANENT (ICD-V45.01) [employer] [phone]  PVD-- stenting b legs 2007 last carotid u/s 3-10  stable  Allergic rhinitis Diverticulosis, colon GERD Osteoporosis OA   End of life issues discussed 03-03-08: does not like heroic measures, intubation or CPR  Past Surgical History: Reviewed history from 06/12/2006 and no changes required. Permanent pacemaker Breast biopsy benign  Social History: Reviewed history from 03/07/2009 and no changes required. lives by self ADL independent still drives no children has family around Tobacco Use - Former.  quit age 64 ETOH--no  Retired  Widowed  Alcohol Use - no  Physical Exam  General:  alert and well-developed.   Lungs:  normal respiratory effort, no intercostal retractions, no accessory muscle use, and normal breath sounds.   Extremities:  no lower extremity edema Inspection and palpation of the hands, wrists, elbows: Normal, no synovitis   Impression &  Recommendations:  Problem # 1:  OSTEOARTHRITIS (ICD-715.90) generalized aches and pains likely from DJD She is not on statins labs reviewed 05-2009: Sedimentation rate was 34 (essentially normal), CK normal I told her that I cannot "cure" her DJD but we can make her better with pain medication She has consistently declined to take narcotics recommend   Tylenol ,see  instructions rheumatology referral if so desired    Her updated medication list for this problem includes:    Bayer Aspirin Ec Low Dose 81 Mg Tbec (Aspirin) .Marland Kitchen... 1 a   day    Baby Aspirin 81 Mg Chew (Aspirin) .Marland Kitchen... 1 by mouth daily    Advil 200 Mg Tabs (Ibuprofen) .Marland Kitchen... As needed  Complete Medication List: 1)  Amlodipine Besylate 5 Mg Tabs (Amlodipine besylate) .Marland Kitchen.. 1 by mouth once daily 2)  Bayer Aspirin Ec Low Dose 81 Mg Tbec (Aspirin) .Marland Kitchen.. 1 a   day 3)  Calcium and Vit D Every Day  4)  Prilosec Otc 20 Mg Tbec (Omeprazole magnesium) .Marland Kitchen.. 1 by mouth once daily 5)  B12 1000 Mcg  .... Daily 6)  Baby Aspirin 81 Mg Chew (Aspirin) .Marland Kitchen.. 1 by mouth daily 7)  Advil 200 Mg Tabs (Ibuprofen) .... As needed  Patient Instructions: 1)  Tylenol 500 mg one or 2 every 6 hours as needed for pain

## 2010-02-12 NOTE — Miscellaneous (Signed)
Summary: BONE DENSITY  Clinical Lists Changes  Orders: Added new Test order of T-Bone Densitometry (77080) - Signed Added new Test order of T-Lumbar Vertebral Assessment (77082) - Signed 

## 2010-02-12 NOTE — Progress Notes (Signed)
Summary: BP Medicine Side Effects  Phone Note Call from Patient   Summary of Call: Per paper note given to nurse:  Patient is having side effects of "heart pill", 5 or 6 side effects. Head still dizzy, has nubness, weakness. The bottle says to seek medical attention, so she is . Please call [phone]. Summary of Call:    Follow-up for Phone Call        Spoke with patient and she said that her BP pill(amlodipine besylate 5mg ) is causing her to be dizzy. After all her "tests" coming back normal, she beleives it is the medicine. She is wondering if we can change her medicine to something else. Please advise.   Ph[facility]acy: [facility] [address].  Additional Follow-up for Phone Call Additional follow up Details #1::        we can switch to losartan 25mg  1 by mouth once daily , call #30 and 1 RF OV 1 month Additional Follow-up by: Jose E. Paz MD,  November 12, 2009 1:00 PM    Additional Follow-up for Phone Call Additional follow up Details #2::    Spoke with pt she is aware that we are changing her meds and to come in in 1 month.  Follow-up by: Army Fossa CMA,  November 12, 2009 1:18 PM  New/Updated Medications: COZAAR 25 MG TABS (LOSARTAN POTASSIUM) 1 by mouth daily. Prescriptions: COZAAR 25 MG TABS (LOSARTAN POTASSIUM) 1 by mouth daily.  #30 x 1   Entered by:   Army Fossa CMA   Authorized by:   Nolon Rod. Paz MD   Signed by:   Army Fossa CMA on 11/12/2009   Method used:   Electronically to        [facility] (retail)       [address]       [place], [place]  [address]       Ph: [phone]       Fax: [phone]   RxID:   [id number]

## 2010-02-12 NOTE — Assessment & Plan Note (Signed)
Summary: DISCUSS PAPERWORK/OK PER DANIELLE/KN   Vital Signs:  Patient profile:   77 year old female Weight:      142.13 pounds Pulse rate:   79 / minute Pulse rhythm:   regular BP sitting:   124 / 80  (left arm) Cuff size:   regular  Vitals Entered By: Army Fossa CMA (December 10, 2009 9:05 AM) CC: Pt here to discuss Paperwork from [facility]. Comments Pt states that she has had the doppler but not supposed to be seeing [facility]. [facility] Fasting    History of Present Illness: here with her niece Ms. Vergia Alcon to see neurology, Dr. Craige Cotta at [facility], they did a CT and a carotid ultrasound. Patient is quite confused about her bills. Overall feels better. Apparently needs paperwork from me  Current Medications (verified): 1)  Cozaar 25 Mg Tabs (Losartan Potassium) .Marland Kitchen.. 1 By Mouth Daily. 2)  Calcium and Vit D Every Day 3)  Prilosec Otc 20 Mg Tbec (Omeprazole Magnesium) .Marland Kitchen.. 1 By Mouth Once Daily 4)  B12 1000 Mcg .... Daily 5)  Baby Aspirin 81 Mg Chew (Aspirin) .Marland Kitchen.. 1 By Mouth Daily 6)  Fiber Caps  Allergies (verified): 1)  ! Hydrocodone 2)  * Penicillins Group  Past History:  Past Medical History: Reviewed history from 06/28/2009 and no changes required. u/s 11-2008: (-) for AAA, does have GB stones Hypertension Hyperlipidemia Cardiomyopathy PACEMAKER, PERMANENT (ICD-V45.01) [employer] [phone]  PVD-- stenting b legs 2007 last carotid u/s 3-10  stable  Allergic rhinitis Diverticulosis, colon GERD Osteoporosis OA   End of life issues discussed 03-03-08: does not like heroic measures, intubation or CPR  Past Surgical History: Reviewed history from 06/12/2006 and no changes required. Permanent pacemaker Breast biopsy benign  Social History: Reviewed history from 11/05/2009 and no changes required. cames to the office frecuently w/ her nephew [name] lives by self ADL independent still drives no children has family  around Tobacco Use - Former.  quit age 73 ETOH--no  Retired  Widowed  Alcohol Use - no  Physical Exam  General:  alert and well-developed.     Impression & Recommendations:  Problem # 1:  DIZZINESS (ICD-780.4) paperwork finished She was discharged from the local neurologist  due to  "no-shows"  consequently was sent to Dr. Craige Cotta in [facility].  Complete Medication List: 1)  Cozaar 25 Mg Tabs (Losartan potassium) .Marland Kitchen.. 1 by mouth daily. 2)  Calcium and Vit D Every Day  3)  Prilosec Otc 20 Mg Tbec (Omeprazole magnesium) .Marland Kitchen.. 1 by mouth once daily 4)  B12 1000 Mcg  .... Daily 5)  Baby Aspirin 81 Mg Chew (Aspirin) .Marland Kitchen.. 1 by mouth daily 6)  Fiber Caps    Orders Added: 1)  Est. Patient Level II [96045]

## 2010-02-12 NOTE — Consult Note (Signed)
Summary: DX imbalance, dizziness. CT head, labs--- neurology  [facility] [place]   Imported By: Lanelle Bal 07/23/2009 12:46:26  _____________________________________________________________________  External Attachment:    Type:   Image     Comment:   External Document

## 2010-02-12 NOTE — Assessment & Plan Note (Signed)
Summary: 3 month roa//lch   Vital Signs:  Patient profile:   77 year old female Weight:      140.25 pounds Pulse rate:   78 / minute Pulse rhythm:   regular BP sitting:   132 / 86  (left arm) Cuff size:   regular  Vitals Entered By: Army Fossa CMA (September 19, 2009 9:42 AM) CC: Pt here for f/u.- Fasting Comments Pharm- [facility]   History of Present Illness:  here with Johnny Has several questions wonders about reclast continued with dizziness as before, saw neurology ---->note reviewed, they felt  like her  dizziness is multifactorial, possibly neuropathy, they ordered a CT of the head, carotid ultrasound. B12 was in the low side, was recommended to take B12 OTC which she is doing  Current Medications (verified): 1)  Amlodipine Besylate 5 Mg Tabs (Amlodipine Besylate) .Marland Kitchen.. 1 By Mouth Once Daily 2)  Bayer Aspirin Ec Low Dose 81 Mg Tbec (Aspirin) .Marland Kitchen.. 1 A   Day 3)  Calcium and Vit D Every Day 4)  Prilosec Otc 20 Mg Tbec (Omeprazole Magnesium) .Marland Kitchen.. 1 By Mouth Once Daily 5)  B12 1000 Mcg .... Daily 6)  Baby Aspirin 81 Mg Chew (Aspirin) .Marland Kitchen.. 1 By Mouth Daily 7)  Advil 200 Mg Tabs (Ibuprofen) .... As Needed  Allergies: 1)  ! Hydrocodone 2)  * Penicillins Group  Past History:  Past Medical History: Reviewed history from 06/28/2009 and no changes required. u/s 11-2008: (-) for AAA, does have GB stones Hypertension Hyperlipidemia Cardiomyopathy PACEMAKER, PERMANENT (ICD-V45.01) [employer] [phone]  PVD-- stenting b legs 2007 last carotid u/s 3-10  stable  Allergic rhinitis Diverticulosis, colon GERD Osteoporosis OA   End of life issues discussed 03-03-08: does not like heroic measures, intubation or CPR  Past Surgical History: Reviewed history from 06/12/2006 and no changes required. Permanent pacemaker Breast biopsy benign  Physical Exam  General:  alert and well-developed.   Lungs:  normal respiratory effort, no intercostal retractions, no  accessory muscle use, and normal breath sounds.   Heart:  normal rate, regular rhythm, no murmur, no gallop, and no rub.     Impression & Recommendations:  Problem # 1:  DIZZINESS (ICD-780.4) saw neurology 06/2009, they think her  dizziness is is multifactorial,  neuropathy likely to be playing a role, they ordered a CT of the head, carotid ultrasound.  results? B12 was in the low side, was recommended to take B12 OTC  which she is doing  Problem # 2:  OTHER B-COMPLEX DEFICIENCIES (ICD-266.2) B 12 was in the low side on 06/2009 when  it was checked by neurology, now on over-the-counter B12 supplements  Problem # 3:  OSTEOPOROSIS (ICD-733.00) last bone density test discussed: we talked about her increased risk of fractures, we also talked about strategies to prevent falls. See previous entries, she does not like oral biphosphonates. We talked about reclast and  she is enthusiastic about it. she understands that this medication would not help her DJD pain Plan: Will set up a reclast infusion  Problem # 4:  face-to-face more than 15 minutes, more than 50% of the time counseling about dizziness and osteoporosis  Complete Medication List: 1)  Amlodipine Besylate 5 Mg Tabs (Amlodipine besylate) .Marland Kitchen.. 1 by mouth once daily 2)  Calcium and Vit D Every Day  3)  Prilosec Otc 20 Mg Tbec (Omeprazole magnesium) .Marland Kitchen.. 1 by mouth once daily 4)  B12 1000 Mcg  .... Daily 5)  Baby Aspirin 81  Mg Chew (Aspirin) .Marland Kitchen.. 1 by mouth daily 6)  Advil 200 Mg Tabs (Ibuprofen) .... As needed  Patient Instructions: 1)  Please schedule a follow-up appointment in 4 months .

## 2010-02-12 NOTE — Progress Notes (Signed)
   Phone Note Call from Patient   Caller: Patient Summary of Call: Patient called to get information on her appointment for Neuro..  I gave the patient her appointment information and the address Initial call taken by: Ok Anis CMA,  July 04, 2009 8:51 AM

## 2010-02-12 NOTE — Assessment & Plan Note (Signed)
Summary: yearly checkup/NS/KDC   Vital Signs:  Patient profile:   77 year old female Height:      64 inches Weight:      143.6 pounds Pulse rate:   70 / minute BP sitting:   120 / 64  Vitals Entered By: Shary Decamp (March 07, 2009 9:12 AM)  CC: yearly - fasting Is Patient Diabetic? No   History of Present Illness: here w/  cousin Harriett Sine  h/o GB stones-- denies persistent abd pain or nausea   Hypertension-- good medication compliance    PACEMAKER-- to see cards next month   GERD-- symptoms well controlled    OA--  still hurts all over (chest-back-legs-arms-shoulders) , was Rx topical diclofenac, not helping much  no HAs, no fevers, no wt loss recently "I feel good except for the pain"    yearly checkup, chart reviewed  Current Medications (verified): 1)  Amlodipine Besylate 5 Mg Tabs (Amlodipine Besylate) .Marland Kitchen.. 1 By Mouth Once Daily 2)  Bayer Aspirin Ec Low Dose 81 Mg Tbec (Aspirin) .Marland Kitchen.. 1 A   Day 3)  Calcium and Vit D Every Day 4)  Prilosec Otc 20 Mg Tbec (Omeprazole Magnesium) .Marland Kitchen.. 1 By Mouth Once Daily 5)  Pennsaid 1.5 % Soln (Diclofenac Sodium) .... 20 Gtts Qid  Allergies (verified): 1)  ! Hydrocodone 2)  * Penicillins Group  Past History:  Past Medical History: u/s 11-2008: (-) for AAA, does have GB stones Hypertension Hyperlipidemia Cardiomyopathy PACEMAKER, PERMANENT (ICD-V45.01) [employer] [phone]  PVD-- stenting b legs 2007 last carotid u/s 3-10  stable  Allergic rhinitis Diverticulosis, colon GERD Osteoporosis OA   End of life issues discussed 03-03-18: does not like heroic measures, intubation or CPR  Past Surgical History: Reviewed history from 06/12/2006 and no changes required. Permanent pacemaker Breast biopsy benign  Family History: Hypertension -- (+)  Breast Ca--no colon ca--no  Social History: lives by self ADL independent still drives no children has family around Tobacco Use - Former.  quit age 3 ETOH--no    Retired  Widowed  Alcohol Use - no  Review of Systems ENT:  RN at times . CV:  Denies swelling of feet; hard to say if has claudication d/t "hurting all over". Resp:  Denies shortness of breath; rarely cough. GI:  Denies bloody stools and diarrhea. GU:  no vag d/c or bleed. Psych:  Denies anxiety and depression.  Physical Exam  General:  alert and well-developed.   Neck:  no masses and normal carotid upstroke.   Breasts:  skin/areolae normal, no abnormal thickening,no dominant or hard  nodularity , no nipple discharge, no tenderness, and no adenopathy.   Lungs:  normal respiratory effort, no intercostal retractions, no accessory muscle use, and normal breath sounds.   Heart:  normal rate, regular rhythm, no murmur, no gallop, and no rub.   Pulses:  normal femoral pulses bilaterally Extremities:  no pretibial edema bilaterally  Psych:  Oriented X3, memory intact for recent and remote, normally interactive, good eye contact, not anxious appearing, and not depressed appearing.     Impression & Recommendations:  Problem # 1:  OSTEOARTHRITIS (ICD-715.90) chief complaint today is generalized aches noting  the absence of headaches, fever or further weight loss recommend Tylenol and low dose Vicodin Her updated medication list for this problem includes:    Bayer Aspirin Ec Low Dose 81 Mg Tbec (Aspirin) .Marland Kitchen... 1 a   day    Hydrocodone-acetaminophen 2.5-500 Mg Tabs (Hydrocodone-acetaminophen) ..... One by mouth every 4  hours as needed for pain  Problem # 2:  ROUTINE GENERAL MEDICAL EXAM@HEALTH  CARE FACL (ICD-V70.0)  Td 2003 pneumonia shot 2002, 2010 had no flu shot, not interested  MMG 8-09 (-), ordering one today, if negative will discuss further screening with the patient next year breast exam today normal PAP? no h/o hysterectomy, last PAP years ago; declines a PAP. Plan-- no further PAPs  per GI:" Two colonoscopies 2002 and 2005 without neoplasia, she is asymptomatic and 71 so  will not conduct further routine colonoscopy exams"  Orders: Radiology Referral (Radiology)  Problem # 3:  HYPERTENSION (ICD-401.9) at goal  Her updated medication list for this problem includes:    Amlodipine Besylate 5 Mg Tabs (Amlodipine besylate) .Marland Kitchen... 1 by mouth once daily  BP today: 120/64 Prior BP: 120/74 (12/13/2008)  Labs Reviewed: K+: 4.6 (11/13/2008) Creat: : 1.1 (11/13/2008)   Chol: 187 (03/03/2008)   HDL: 50.5 (03/03/2008)   LDL: 125 (03/03/2008)   TG: 60 (03/03/2008)  Problem # 4:  CHOLELITHIASIS (ICD-574.20) asx  Problem # 5:  AV BLOCK, COMPLETE (ICD-426.0) has a pacemaker, to see cardiology soon  Problem # 6:  PVD (ICD-443.9) asymptomatic  Problem # 7:  OSTEOPOROSIS (ICD-733.00) see previous comments recommend calcium and vitamin D daily  Complete Medication List: 1)  Amlodipine Besylate 5 Mg Tabs (Amlodipine besylate) .Marland Kitchen.. 1 by mouth once daily 2)  Bayer Aspirin Ec Low Dose 81 Mg Tbec (Aspirin) .Marland Kitchen.. 1 a   day 3)  Calcium and Vit D Every Day  4)  Prilosec Otc 20 Mg Tbec (Omeprazole magnesium) .Marland Kitchen.. 1 by mouth once daily 5)  Pennsaid 1.5 % Soln (Diclofenac sodium) .... 20 gtts qid 6)  Hydrocodone-acetaminophen 2.5-500 Mg Tabs (Hydrocodone-acetaminophen) .... One by mouth every 4 hours as needed for pain  Other Orders: Venipuncture (16109) TLB-Lipid Panel (80061-LIPID)  Patient Instructions: 1)  for pain, take Tylenol 500 mg one or two tablets  every 6 hours as needed. 2)  If the pain continued you  may like to try hydrocodone instead.  It  will make you  drowsy. 3)  Please schedule a follow-up appointment in 6 months .  Prescriptions: HYDROCODONE-ACETAMINOPHEN 2.5-500 MG TABS (HYDROCODONE-ACETAMINOPHEN) one by mouth every 4 hours as needed for pain  #40 x 0   Entered and Authorized by:   Nolon Rod. Tamkia Temples MD   Signed by:   Nolon Rod. Emmilynn Marut MD on 03/07/2009   Method used:   Print then Give to Patient   RxID:   [id number] AMLODIPINE BESYLATE 5 MG TABS  (AMLODIPINE BESYLATE) 1 by mouth once daily  #30 Each x 6   Entered by:   Shary Decamp   Authorized by:   Nolon Rod. Eugene Zeiders MD   Signed by:   Shary Decamp on 03/07/2009   Method used:   Electronically to        [facility] (retail)       [address]       [place], [place]  [address]       Ph: [phone]       Fax: [phone]   RxID:   [id number]    Preventive Care Screening  Prior Values:    Colonoscopy:  Results: Hemorrhoids.     Results: Diverticulosis.       Location:  [facility].   (04/13/2003)    Bone Density:  osteoporosis (06/12/2007)    Last Tetanus Booster:  historical (06/17/2001)  Last Pneumovax:  Pneumovax (Medicare) (03/03/2008)    Dexa Interp:  osteoporosis (06/12/2007)

## 2010-02-12 NOTE — Progress Notes (Signed)
Summary: Reclast-- cannot afford.   Phone Note Call from Patient   Summary of Call: I spoke with pt  to discuss Reclast her insurance is only covering 80%, pt states that she cannot pay the other 20% left. Please advise is there another option for pt. Army Fossa CMA  September 20, 2009 2:23 PM   Follow-up for Phone Call        will try another infusion center tha tmay be less expensive. Follow-up by: Nolon Rod. Paz MD,  September 21, 2009 10:34 AM  Additional Follow-up for Phone Call Additional follow up Details #1::        Pt is aware that we are going to send to a site and see if it is less expensive. And this site will contact her. Army Fossa CMA  September 25, 2009 9:27 AM      Appended Document: Reclast-- cannot afford.  I faxed over orders to [facility]- they set up everything. I asked them to let us know if pt declines to get Reclast.

## 2010-02-12 NOTE — Assessment & Plan Note (Signed)
Summary: pacer check/guidant    Visit Type:  Follow-up Primary Provider:  Nolon Rod. Paz MD   History of Present Illness: [name] returns today for followup.  She is a very pleasant elderly woman with complete heart block and pacemaker, also with hypertension. She has fairly severe arthritis.  She denies chest pain or shortness ofbreath.  She has done extremely well in the last year.   Current Medications (verified): 1)  Amlodipine Besylate 5 Mg Tabs (Amlodipine Besylate) .Marland Kitchen.. 1 By Mouth Once Daily 2)  Bayer Aspirin Ec Low Dose 81 Mg Tbec (Aspirin) .Marland Kitchen.. 1 A   Day 3)  Calcium and Vit D Every Day 4)  Prilosec Otc 20 Mg Tbec (Omeprazole Magnesium) .Marland Kitchen.. 1 By Mouth Once Daily  Allergies: 1)  ! Hydrocodone 2)  * Penicillins Group  Past History:  Past Medical History: Last updated: 03/07/2009 u/s 11-2008: (-) for AAA, does have GB stones Hypertension Hyperlipidemia Cardiomyopathy PACEMAKER, PERMANENT (ICD-V45.01) [employer] [phone]  PVD-- stenting b legs 2007 last carotid u/s 3-10  stable  Allergic rhinitis Diverticulosis, colon GERD Osteoporosis OA   End of life issues discussed 03-03-18: does not like heroic measures, intubation or CPR  Past Surgical History: Last updated: 06/12/2006 Permanent pacemaker Breast biopsy benign  Review of Systems  The patient denies chest pain, syncope, dyspnea on exertion, and peripheral edema.    Vital Signs:  Patient profile:   77 year old female Height:      64 inches Weight:      147 pounds Pulse rate:   65 / minute BP sitting:   162 / 72  (left arm)  Vitals Entered By: Laurance Flatten CMA (March 27, 2009 3:49 PM)  Physical Exam  General:  Elderly, well developed, well nourished, in no acute distress.  HEENT: normal Neck: supple. No JVD. Carotids 2+ bilaterally no bruits Cor: RRR no rubs, gallops or murmur Lungs: CTA. Well healed PPM incision. Ab: soft, nontender. nondistended. No HSM. Good bowel sounds Ext: warm. no  cyanosis, clubbing or edema Neuro: alert and oriented. Grossly nonfocal. affect pleasant \\par   PPM Specifications Following MD:  Lewayne Bunting, MD     PPM Vendor:  [facility] [employer]     PPM Model Number:  [id number]     PPM Serial Number:  [id number] PPM DOI:  10/02/2006      Lead 1    Location: RA     DOI: 03/24/2000     Model #: [id number]     Serial #: [id number]     Status: active Lead 2    Location: RV     DOI: 03/24/2000     Model #: [id number]     Serial #: [id number]     Status: active  Magnet Response Rate:  BOL 100 ERI  85  Indications:  CHB   PPM Follow Up Remote Check?  No Battery Voltage:  good V     Battery Est. Longevity:  >5 years     Pacer Dependent:  No       PPM Device Measurements Atrium  Amplitude: 1.9 mV, Impedance: 520 ohms, Threshold: 0.8 V at 0.4 msec Right Ventricle  Amplitude: 2.0 mV, Impedance: 520 ohms, Threshold: 1.0 V at 0.4 msec  Episodes MS Episodes:  0     Percent Mode Switch:  0     Coumadin:  No Atrial Pacing:  18%     Ventricular Pacing:  14%  Parameters Mode:  DDD     Lower Rate Limit:  60     Upper Rate Limit:  120 Paced AV Delay:  300     Sensed AV Delay:  250 Next Cardiology Appt Due:  09/13/2009 Tech Comments:  No parameter changes.  Device function normal.  R-waves chronic @ 2mV.  ROV 6 months clinic. Altha Harm, LPN  March 27, 2009 4:06 PM  MD Comments:  Agree with above.  Impression & Recommendations:  Problem # 1:  PACEMAKER, PERMANENT (ICD-V45.01) Her device is working normally.  Will recheck in several months.  Problem # 2:  HYPERTENSION (ICD-401.9) Continue her current meds and maintain a low sodium diet. Her updated medication list for this problem includes:    Amlodipine Besylate 5 Mg Tabs (Amlodipine besylate) .Marland Kitchen... 1 by mouth once daily    Bayer Aspirin Ec Low Dose 81 Mg Tbec (Aspirin) .Marland Kitchen... 1 a   day  Problem # 3:  HYPERLIPIDEMIA (ICD-272.4)

## 2010-02-12 NOTE — Progress Notes (Signed)
Summary: reviewing records from neurology  Phone Note Outgoing Call   Summary of Call: carotid ultrasound 7-11----0 to  39% bilaterally CT of the head 07/2009----atrophy otherwise normal Office visit from neurology 9-11 reviewed, her dizziness is felt to be multifactorial. they ordered a  nerve conduction study to rule out neuropathy. Nerve conduction study not available to me today Advised patient I review the   reports, fortunately her ultrasound is good and the head CT is stable. nothing new to suggest at this point. Jose E. Paz MD  November 06, 2009 11:25 AM   Follow-up for Phone Call        Pt is aware. Follow-up by: Army Fossa CMA,  November 06, 2009 11:33 AM

## 2010-02-14 NOTE — Procedures (Signed)
Summary: pacer check.gdt.amber      Allergies Added:   Current Medications (verified): 1)  Cozaar 25 Mg Tabs (Losartan Potassium) .Marland Kitchen.. 1 By Mouth Daily. 2)  Calcium and Vit D Every Day 3)  Prilosec Otc 20 Mg Tbec (Omeprazole Magnesium) .Marland Kitchen.. 1 By Mouth Once Daily 4)  B12 1000 Mcg .... Daily 5)  Baby Aspirin 81 Mg Chew (Aspirin) .Marland Kitchen.. 1 By Mouth Daily 6)  Fiber Caps  Allergies (verified): 1)  ! Hydrocodone 2)  * Penicillins Group  PPM Specifications Following MD:  Lewayne Bunting, MD     PPM Vendor:  [facility] [employer]     PPM Model Number:  [id number]     PPM Serial Number:  [id number] PPM DOI:  10/02/2006      Lead 1    Location: RA     DOI: 03/24/2000     Model #: [id number]     Serial #: [id number]     Status: active Lead 2    Location: RV     DOI: 03/24/2000     Model #: [id number]     Serial #: [id number]     Status: active  Magnet Response Rate:  BOL 100 ERI  85  Indications:  CHB   PPM Follow Up Battery Voltage:  GOOD V     Battery Est. Longevity:  >5.0 YRS     Pacer Dependent:  Yes       PPM Device Measurements Atrium  Amplitude: 1.1 mV, Impedance: 320 ohms, Threshold: 4.0 V at 0.40 msec Right Ventricle  Amplitude: 1.5 mV, Impedance: 470 ohms, Threshold: 0.9 V at 0.40 msec  Episodes MS Episodes:  4     Percent Mode Switch:  0%     Coumadin:  No Ventricular High Rate:  0     Atrial Pacing:  14%     Ventricular Pacing:  57%  Parameters Mode:  DDD     Lower Rate Limit:  60     Upper Rate Limit:  120 Paced AV Delay:  300     Sensed AV Delay:  250 Next Cardiology Appt Due:  07/15/2010 Tech Comments:  4 MODE SWITCHES--TOTAL TIME 1.9 HOURS.  RA THRESHOLD 4.0 @ 0.40 (LAST CHECK 0.9 @ 0.40)  AND RA LEAD IMPEDANCE 320 (LAST CHECK 470).  PT HAD A FALL IN JULY AND DEVICE WAS CHECKED AND FOUND TO BE NORMAL.  AP 14% SINCE LAST CHECK.  ROV IN 6 MTHS W/GT. Vella Kohler  January 16, 2010 5:35 PM

## 2010-02-14 NOTE — Progress Notes (Signed)
Summary: FYI  Phone Note Call from Patient Call back at [facility] Phone [phone]   Caller: Patient Summary of Call: Pt called and asked if she could just not take the Cozaar. I informed pt that she needed to stay on the Cozaar because if her BP is high that could be worse than the dizziness. Pt is aware and will continue taking the Cozaar. Informed her that Luster Landsberg should be in touch soon to give her, her appt for ENT.  Initial call taken by: Army Fossa CMA,  January 31, 2010 9:12 AM  Follow-up for Phone Call       Follow-up by: Nolon Rod. Analya Louissaint MD,  January 31, 2010 2:11 PM

## 2010-02-14 NOTE — Progress Notes (Signed)
Summary: dizziness- REFERALL  Phone Note Call from Patient Call back at Home Phone [phone]   Caller: Patient Summary of Call: Pts called and states that her dizziness is getting worse. She feels that maybe it is coming from her Cozaar. She states that it has been getting worse over the past week. Please advise. Army Fossa CMA  January 29, 2010 1:15 PM   Follow-up for Phone Call        she was dizzy on amlodipine, that is why we changed her to Cozaar. I rec. to stay on Cozaar for now. if the dizziness is getting worse, she will have to see a specialist (although she saw neuro before,  sx though to be multifactorial, unable to get patient re-evaluated). if the dizziness is about the same, I recommend observation Follow-up by: Verina Galeno E. Senovia Gauer MD,  January 29, 2010 4:57 PM  Additional Follow-up for Phone Call Additional follow up Details #1::        Pt states that the dizziness is a lot worse. She is okay w/ seeing a Specialist- who do you want me to refer her to?  ---------------------------- ENT please  Ravindra Baranek E. Elaya Droege MD  January 30, 2010 10:59 AM

## 2010-02-14 NOTE — Cardiovascular Report (Signed)
Summary: Office Visit   Office Visit   Imported By: Roderic Ovens 02/04/2010 10:50:32  _____________________________________________________________________  External Attachment:    Type:   Image     Comment:   External Document

## 2010-02-14 NOTE — Assessment & Plan Note (Signed)
Summary: 4 MONTH FOLLOWUP/KN   Vital Signs:  Patient profile:   77 year old female Weight:      142 pounds Pulse rate:   78 / minute Pulse rhythm:   regular BP sitting:   130 / 84  (left arm) Cuff size:   regular  Vitals Entered By: Army Fossa CMA (January 22, 2010 9:32 AM) CC: 4 month f/u- fasting  Comments since taking the cozaar being dizzy still [facility]    History of Present Illness: routine office visit  Hypertension--- still dizzy, thinks may be related to cozaar  (saw neurology 06/2009, they think her  dizziness is is multifactorial). symptoms are about the same as before  OA -- still has joint pain, takes tylenol or advil , he does not have any particular joint that is worse, she hurts "all over"   ROS No heartburn, nausea or vomiting No fevers No headaches No weight loss  Current Medications (verified): 1)  Cozaar 25 Mg Tabs (Losartan Potassium) .Marland Kitchen.. 1 By Mouth Daily. 2)  Calcium and Vit D Every Day 3)  Prilosec Otc 20 Mg Tbec (Omeprazole Magnesium) .Marland Kitchen.. 1 By Mouth Once Daily 4)  B12 1000 Mcg .... Daily 5)  Baby Aspirin 81 Mg Chew (Aspirin) .Marland Kitchen.. 1 By Mouth Daily 6)  Vit D  Allergies (verified): 1)  ! Hydrocodone 2)  * Penicillins Group  Past History:  Past Medical History: Reviewed history from 06/28/2009 and no changes required. u/s 11-2008: (-) for AAA, does have GB stones Hypertension Hyperlipidemia Cardiomyopathy PACEMAKER, PERMANENT (ICD-V45.01) [employer] [phone]  PVD-- stenting b legs 2007 last carotid u/s 3-10  stable  Allergic rhinitis Diverticulosis, colon GERD Osteoporosis OA   End of life issues discussed 03-03-08: does not like heroic measures, intubation or CPR  Past Surgical History: Reviewed history from 06/12/2006 and no changes required. Permanent pacemaker Breast biopsy benign  Social History: Reviewed history from 11/05/2009 and no changes required. cames to the office frecuently w/ her nephew  [name] lives by self ADL independent still drives no children has family around Tobacco Use - Former.  quit age 46 ETOH--no  Retired  Widowed  Alcohol Use - no  Physical Exam  General:  alert and well-developed.   Lungs:  normal respiratory effort, no intercostal retractions, no accessory muscle use, and normal breath sounds.   Heart:  normal rate, regular rhythm, no murmur, no gallop, and no rub.   Msk:  inspection and palpation of the wrists and hands showed no synovitis. Palpation of the muscles of the upper extremities cause questionable discomfort Extremities:  no edema Neurologic:  sensation intact to light touch.     Impression & Recommendations:  Problem # 1:  HYPERTENSION (ICD-401.9)  Her updated medication list for this problem includes:    Cozaar 25 Mg Tabs (Losartan potassium) .Marland Kitchen... 1 by mouth daily.  BP today: 130/84 Prior BP: 124/80 (12/10/2009)  Labs Reviewed: K+: 4.3 (08/31/2009) Creat: : 1.0 (08/31/2009)   Chol: 183 (03/07/2009)   HDL: 61.50 (03/07/2009)   LDL: 109 (03/07/2009)   TG: 61.0 (03/07/2009)  Orders: Venipuncture (96045) TLB-BMP (Basic Metabolic Panel-BMET) (80048-METABOL) Specimen Handling (40981)  Problem # 2:  DIZZINESS (ICD-780.4) She was discharged from the local neurologist  due to  "no-shows"  consequently was sent to Dr. Craige Cotta in [facility] but that particular doctor is out of network consequently she couldn't see them. Symptoms stable. observation for now  Problem # 3:  OSTEOARTHRITIS (ICD-715.90) continued complaint of pain, Tylenol and  Advil no controlling the symptoms as well as she likes. History of itching with hydrocodone Plan: Trial with Ultram, see  instructions Her updated medication list for this problem includes:    Baby Aspirin 81 Mg Chew (Aspirin) .Marland Kitchen... 1 by mouth daily    Tramadol Hcl 50 Mg Tabs (Tramadol hcl) ..... One by mouth every 6 hours as needed for pain    Complete Medication List: 1)  Cozaar 25 Mg Tabs  (Losartan potassium) .Marland Kitchen.. 1 by mouth daily. 2)  Calcium and Vit D Every Day  3)  Prilosec Otc 20 Mg Tbec (Omeprazole magnesium) .Marland Kitchen.. 1 by mouth once daily 4)  B12 1000 Mcg  .... Daily 5)  Baby Aspirin 81 Mg Chew (Aspirin) .Marland Kitchen.. 1 by mouth daily 6)  Vit D  7)  Tramadol Hcl 50 Mg Tabs (Tramadol hcl) .... One by mouth every 6 hours as needed for pain  Patient Instructions: 1)  Tylenol 500 mg 2 tablets every 6 hours as needed for pain. 2)  If the pain continue, use tramadol, may cause drowsiness 3)  came back in 3 months, fasting, for your yearly checkup Prescriptions: TRAMADOL HCL 50 MG TABS (TRAMADOL HCL) One by mouth every 6 hours as needed for pain  #60 x 0   Entered and Authorized by:   Nolon Rod. Charley Lafrance MD   Signed by:   Nolon Rod. Abimbola Aki MD on 01/22/2010   Method used:   Print then Give to Patient   RxID:   [id number]    Orders Added: 1)  Venipuncture [14782] 2)  TLB-BMP (Basic Metabolic Panel-BMET) [80048-METABOL] 3)  Specimen Handling [99000] 4)  Est. Patient Level III [95621]

## 2010-03-14 ENCOUNTER — Encounter: Payer: Self-pay | Admitting: Internal Medicine

## 2010-03-14 ENCOUNTER — Ambulatory Visit (INDEPENDENT_AMBULATORY_CARE_PROVIDER_SITE_OTHER): Payer: Medicare Other | Admitting: Internal Medicine

## 2010-03-14 ENCOUNTER — Telehealth (INDEPENDENT_AMBULATORY_CARE_PROVIDER_SITE_OTHER): Payer: Self-pay | Admitting: *Deleted

## 2010-03-14 DIAGNOSIS — R42 Dizziness and giddiness: Secondary | ICD-10-CM

## 2010-03-14 DIAGNOSIS — I10 Essential (primary) hypertension: Secondary | ICD-10-CM

## 2010-03-14 DIAGNOSIS — L299 Pruritus, unspecified: Secondary | ICD-10-CM

## 2010-03-19 ENCOUNTER — Encounter: Payer: Self-pay | Admitting: Internal Medicine

## 2010-03-20 ENCOUNTER — Telehealth (INDEPENDENT_AMBULATORY_CARE_PROVIDER_SITE_OTHER): Payer: Self-pay | Admitting: *Deleted

## 2010-03-21 ENCOUNTER — Telehealth (INDEPENDENT_AMBULATORY_CARE_PROVIDER_SITE_OTHER): Payer: Self-pay | Admitting: *Deleted

## 2010-03-21 NOTE — Progress Notes (Signed)
Summary: rx  Phone Note Refill Request Call back at Home Phone [phone]   Refills Requested: Medication #1:  losartan 25 mg   Dosage confirmed as above?Dosage Confirmed   Supply Requested: 1 month please send to [facility] on [address]. Please do this today   Method Requested: Fax to Local Pharmacy Initial call taken by: Freddy Jaksch,  March 14, 2010 3:39 PM    Prescriptions: COZAAR 25 MG TABS (LOSARTAN POTASSIUM) 1 by mouth daily.  #30 Each x 1   Entered by:   Army Fossa CMA   Authorized by:   Nolon Rod. Paz MD   Signed by:   Army Fossa CMA on 03/14/2010   Method used:   Electronically to        [facility] (retail)       [address]       [place], [place]  [address]       Ph: [phone]       Fax: [phone]   RxID:   [id number]

## 2010-03-21 NOTE — Assessment & Plan Note (Signed)
Summary: feels bad, bp pill making her itchy, still swimmy-headed///sph   Vital Signs:  Patient profile:   77 year old female Weight:      139 pounds Pulse rate:   84 / minute Pulse rhythm:   regular BP sitting:   132 / 74  (left arm) Cuff size:   regular  Vitals Entered By: Army Fossa CMA (March 14, 2010 9:26 AM) CC: Pt here to discuss BP med side effects  Comments Itchy, dizzy, tire, having muscle pain, rash on arms, low BP  [employer] [facility]     History of Present Illness:   patient wonders if some of the symptoms that she is experiencing are related to her blood pressure medicine  for several months, she has generalized itching , worse at night  she continued to be dizzy on and off  She's tired on-and-off  from time to time has shoulder pain, right worse than left  low blood pressure? On further questioning, her blood pressure is around 120/67 when checked   chart is reviewed--  10-31/2011 she was switched from amlodipine to losartan due to dizziness, numbness, weakness.  ROS  no actual rash  No  other allergic reaction type of symptoms such as wheezing, lip swelling or difficulty breathing  She is hurting in her shoulders but denies fevers, headaches, pains around the hips.  Current Medications (verified): 1)  Cozaar 25 Mg Tabs (Losartan Potassium) .Marland Kitchen.. 1 By Mouth Daily. 2)  Calcium and Vit D Every Day 3)  Prilosec Otc 20 Mg Tbec (Omeprazole Magnesium) .Marland Kitchen.. 1 By Mouth Once Daily 4)  B12 1000 Mcg .... Daily 5)  Baby Aspirin 81 Mg Chew (Aspirin) .Marland Kitchen.. 1 By Mouth Daily 6)  Vit D 7)  Tramadol Hcl 50 Mg Tabs (Tramadol Hcl) .... One By Mouth Every 6 Hours As Needed For Pain 8)  Tylenol 325 Mg Tabs (Acetaminophen)  Allergies (verified): 1)  ! Hydrocodone 2)  * Penicillins Group  Past History:  Past Medical History: Reviewed history from 06/28/2009 and no changes required. u/s 11-2008: (-) for AAA, does have GB  stones Hypertension Hyperlipidemia Cardiomyopathy PACEMAKER, PERMANENT (ICD-V45.01) [employer] Altrua [id number]  PVD-- stenting b legs 2007 last carotid u/s 3-10  stable  Allergic rhinitis Diverticulosis, colon GERD Osteoporosis OA   End of life issues discussed 03-03-08: does not like heroic measures, intubation or CPR  Past Surgical History: Reviewed history from 06/12/2006 and no changes required. Permanent pacemaker Breast biopsy benign  Social History: Reviewed history from 11/05/2009 and no changes required. cames to the office frecuently w/ her nephew [name] lives by self ADL independent still drives no children has family around Tobacco Use - Former.  quit age 38 ETOH--no  Retired  Widowed  Alcohol Use - no  Physical Exam  General:  alert and well-developed.   Lungs:  normal respiratory effort, no intercostal retractions, no accessory muscle use, and normal breath sounds.   Heart:  normal rate, regular rhythm, no murmur, no gallop, and no rub.   Msk:   range of motion of the shoulders is normal, palpation of the muscle mass around the shoulders negative Skin:   skiing in the back, abdomen, arms and lower extremities is dry, no rash or hives   Impression & Recommendations:  Problem # 1:  PRURITUS (ICD-698.9)  generalized pruritus   no other symptoms consistent with allergies, doubt  this is related to BP meds. Likely related to skin dryness see instructions  Problem # 2:  HYPERTENSION (  ICD-401.9)  continue present medications for now Her updated medication list for this problem includes:    Cozaar 25 Mg Tabs (Losartan potassium) .Marland Kitchen... 1 by mouth daily.  BP today: 132/74 Prior BP: 130/84 (01/22/2010)  Labs Reviewed: K+: 4.6 (01/22/2010) Creat: : 0.9 (01/22/2010)   Chol: 183 (03/07/2009)   HDL: 61.50 (03/07/2009)   LDL: 109 (03/07/2009)   TG: 61.0 (03/07/2009)  Problem # 3:  DIZZINESS (ICD-780.4)  has dizziness on and off, not a new issue.  Observation  Complete Medication List: 1)  Cozaar 25 Mg Tabs (Losartan potassium) .Marland Kitchen.. 1 by mouth daily. 2)  Calcium and Vit D Every Day  3)  Prilosec Otc 20 Mg Tbec (Omeprazole magnesium) .Marland Kitchen.. 1 by mouth once daily 4)  B12 1000 Mcg  .... Daily 5)  Baby Aspirin 81 Mg Chew (Aspirin) .Marland Kitchen.. 1 by mouth daily 6)  Vit D  7)  Tramadol Hcl 50 Mg Tabs (Tramadol hcl) .... One by mouth every 6 hours as needed for pain 8)  Tylenol 325 Mg Tabs (Acetaminophen)  Patient Instructions: 1)  use aveeno lotion daily 2)  avoid hot showers 3)  call if no better in 1 month   Orders Added: 1)  Est. Patient Level III [60454]

## 2010-03-26 NOTE — Progress Notes (Signed)
Summary: stopped medication   Phone Note Call from Patient Call back at Home Phone [phone]   Caller: Patient Summary of Call: patient  called says that she went to ENT and says Dr. Erasmo Downer find anything to be causing dizziness and believes that it is medication causing the issue so patient called to say that she isn't taking losartan anymore.  Initial call taken by: Doristine Devoid CMA,  March 20, 2010 11:05 AM

## 2010-03-26 NOTE — Progress Notes (Signed)
Summary: concerned about blood pressure--called back  Phone Note Call from Patient Call back at Home Phone [phone]   Caller: Patient Complaint: Nausea/Vomiting/Diarrhea Summary of Call: patient called ---she is very concerned because she has stopped taking the Losartan on Monday 3/5 and, although the dizziness cleared up within 24 hours, she is concerned that she is not taking a blood pressure medication now ----she doesnt know what her next step is---  her relative told her not to stop the medication without the doctors approval and she is concerned that she hasnt had this medication since Monday---please call her at [phone] Initial call taken by: Jerolyn Shin,  March 21, 2010 3:55 PM  Follow-up for Phone Call        I have spoken w/ patient twice informed that we will let Dr. Drue Novel know and we will call her with further recommendations.......Marland KitchenDoristine Devoid CMA  March 21, 2010 4:14 PM   Additional Follow-up for Phone Call Additional follow up Details #1::         if possible, check her blood pressures daily, call if BP more than 145/85. If unable to take at home, come to the office Friday and Monday for a BP check (no charge) Additional Follow-up by: Elita Quick E. Paz MD,  March 21, 2010 6:16 PM    Additional Follow-up for Phone Call Additional follow up Details #2::    spoke w/ patient aware of instructions and will contact office if any concerns........Marland KitchenDoristine Devoid CMA  March 22, 2010 10:57 AM

## 2010-04-02 NOTE — Consult Note (Signed)
Summary: ENT--- dizzines d/t meds? f/u prn  [facility]   Imported By: Maryln Gottron 03/22/2010 14:00:39  _____________________________________________________________________  External Attachment:    Type:   Image     Comment:   External Document

## 2010-04-18 ENCOUNTER — Telehealth: Payer: Self-pay | Admitting: *Deleted

## 2010-04-18 NOTE — Telephone Encounter (Signed)
Please ask Lowella Bandy to handle

## 2010-04-18 NOTE — Telephone Encounter (Signed)
Pt left a VM that is was imperative that she speak w/ Dr.Paz about an appeal. She was sent to an out of network office in [facility]. She is now getting a bill for the visit and they told her this is "Dr.Paz's problem".

## 2010-04-18 NOTE — Telephone Encounter (Signed)
Spoke with patient about referral to [facility] and explained to her that according to our records, she was discharged from [facility] for 2 no-shows.  This means in order for her to see a Neurologist, we had to refer her to the next closest facility to [facility] which was [facility].  [facility] office did not let the patient know that her insurance will not cover her visit until after she was seen.  [name] understood everything and has set up payment arrangements to pay [facility]'s bill.

## 2010-05-15 ENCOUNTER — Encounter: Payer: Self-pay | Admitting: Internal Medicine

## 2010-05-16 ENCOUNTER — Ambulatory Visit (INDEPENDENT_AMBULATORY_CARE_PROVIDER_SITE_OTHER): Payer: Medicare Other | Admitting: Internal Medicine

## 2010-05-16 ENCOUNTER — Encounter: Payer: Self-pay | Admitting: Internal Medicine

## 2010-05-16 DIAGNOSIS — D649 Anemia, unspecified: Secondary | ICD-10-CM

## 2010-05-16 DIAGNOSIS — M1990 Unspecified osteoarthritis, unspecified site: Secondary | ICD-10-CM

## 2010-05-16 DIAGNOSIS — Z79899 Other long term (current) drug therapy: Secondary | ICD-10-CM

## 2010-05-16 DIAGNOSIS — I739 Peripheral vascular disease, unspecified: Secondary | ICD-10-CM

## 2010-05-16 DIAGNOSIS — I428 Other cardiomyopathies: Secondary | ICD-10-CM

## 2010-05-16 DIAGNOSIS — M810 Age-related osteoporosis without current pathological fracture: Secondary | ICD-10-CM

## 2010-05-16 DIAGNOSIS — I10 Essential (primary) hypertension: Secondary | ICD-10-CM

## 2010-05-16 DIAGNOSIS — R42 Dizziness and giddiness: Secondary | ICD-10-CM

## 2010-05-16 DIAGNOSIS — Z0000 Encounter for general adult medical examination without abnormal findings: Secondary | ICD-10-CM | POA: Insufficient documentation

## 2010-05-16 LAB — IRON: Iron: 61 ug/dL (ref 42–145)

## 2010-05-16 LAB — FERRITIN: Ferritin: 30.2 ng/mL (ref 10.0–291.0)

## 2010-05-16 LAB — CBC WITH DIFFERENTIAL/PLATELET
Basophils Absolute: 0 10*3/uL (ref 0.0–0.1)
Eosinophils Absolute: 0.2 10*3/uL (ref 0.0–0.7)
Eosinophils Relative: 3.4 % (ref 0.0–5.0)
HCT: 31.3 % — ABNORMAL LOW (ref 36.0–46.0)
Lymphs Abs: 1.5 10*3/uL (ref 0.7–4.0)
MCV: 89.9 fl (ref 78.0–100.0)
Monocytes Absolute: 0.4 10*3/uL (ref 0.1–1.0)
Neutrophils Relative %: 61.3 % (ref 43.0–77.0)
Platelets: 253 10*3/uL (ref 150.0–400.0)
RDW: 14.6 % (ref 11.5–14.6)
WBC: 5.5 10*3/uL (ref 4.5–10.5)

## 2010-05-16 LAB — LIPID PANEL
Cholesterol: 167 mg/dL (ref 0–200)
Total CHOL/HDL Ratio: 3
Triglycerides: 49 mg/dL (ref 0.0–149.0)

## 2010-05-16 NOTE — Assessment & Plan Note (Addendum)
Td 2003 pneumonia shot 2002, 2010 Shingles immunization discussed, info provided   Last [facility] 8-09 (-) to my knowledge , not interested on further MM[facility] PAP? no h/o hysterectomy, last PAP years ago; declines a PAP. Plan-- no further PAPs  per GI:" Two colonoscopies 2002 and 2005 without neoplasia, she is asymptomatic and 15 so will not conduct further routine colonoscopy exams" Diet, exercise and safety discussed. We'll check a CBC, iron and B12, history of mild anemia.

## 2010-05-16 NOTE — Assessment & Plan Note (Signed)
Chronic issue. Was evaluated at ENT already

## 2010-05-16 NOTE — Assessment & Plan Note (Signed)
Stable

## 2010-05-16 NOTE — Assessment & Plan Note (Signed)
Ongoing issue. In the past we have checked sedimentation rates and CKs and they were normal. History of itching with hydrocodone. Currently on Ultram.

## 2010-05-16 NOTE — Progress Notes (Signed)
Subjective:    Patient ID: [name], female    DOB: 05/09/1922, 77 y.o.   MRN: [id number]  HPI  Here for Medicare AWV:  1. Risk factors based on Past M, S, F history: reviewed 2. Physical Activities: not very active  3. Depression/mood:  No problems noted or reported  4. Hearing:  No problems noted or reported , has seen an audiologist before  5. ADL's:  Still drives, live by herself, totally independent  6. Fall Risk: no recent falls uses a cane, fall prevention discussed  7. home Safety: does feelsafe at home  8. Height, weight, &visual acuity: see VS, vision is good w/ glasses, saw the eye doctor 2 weeks ago. 9. Counseling: provided 10. Labs ordered based on risk factors: if needed  11. Referral Coordination: if needed 12.  Care Plan, see assessment and plan  13.   Cognitive Assessment: motor skills limited by DJD, cognition appropiate  In addition, today we discussed the following: Feels great HTN-- not on meds , amb BPs 130/60 Hyperlipidemia-- on no meds , good diet! Pacemaker-- to see cards soon  Past Medical History  Diagnosis Date  . Hypertension   . Hyperlipidemia   . Cardiomyopathy   . Pacemaker     [employer] Altrua [id number]  . PVD (peripheral vascular disease) 2007    stenting b legs 2007,   . Allergic rhinitis   . GERD (gastroesophageal reflux disease)   . Osteoporosis   . Osteoarthritis   . Gallstones     per [place] 11-2008, also no AAA   Past Surgical History  Procedure Date  . Pacemaker insertion     permanent   . Breast biopsy benign    Family History  Problem Relation Age of Onset  . Hypertension    . Breast cancer Neg Hx   . Colon cancer Neg Hx    History   Social History  . Marital Status: Widowed    Spouse Name: N/A    Number of Children: N/A  . Years of Education: N/A   Occupational History  . retired    Social History Main Topics  . Smoking status: Former Games developer  . Smokeless tobacco: Not on file  . Alcohol Use: No  .  Drug Use: Not on file  . Sexually Active: Not on file   Other Topics Concern  . Not on file   Social History Narrative   Comes to the office frequently with her nephew [name].Marland KitchenMarland KitchenLives by her self.Marland KitchenMarland KitchenADL independent.Marland KitchenMarland KitchenStill drives.Marland KitchenMarland KitchenNo children but has family around....Marland KitchenMarland KitchenEnd of life issues discussed  Today 05-16-10, again she does not like heroic measures, intubation or CPR.       Review of Systems  Constitutional: Negative for fever. Fatigue: tired at times   Respiratory: Negative for cough, shortness of breath and wheezing.   Cardiovascular: Negative for leg swelling.  Gastrointestinal: Negative for abdominal pain and blood in stool.  Genitourinary: Negative for hematuria, vaginal bleeding, vaginal discharge and difficulty urinating.       Some nocturia  Musculoskeletal:       Cont w/ DJD pains including hips  Neurological: Negative for dizziness, syncope and headaches.       Objective:   Physical Exam  Constitutional: She is oriented to person, place, and time. She appears well-developed and well-nourished. No distress.  HENT:  Head: Normocephalic and atraumatic.  Cardiovascular: Normal rate, regular rhythm and normal heart sounds.  Exam reveals no friction rub.   No murmur heard. Pulmonary/Chest: Effort normal  and breath sounds normal. No respiratory distress. She has no wheezes. She has no rales.  Abdominal: Soft. She exhibits no distension. There is no tenderness. There is no rebound and no guarding.  Musculoskeletal: She exhibits no edema.  Neurological: She is alert and oriented to person, place, and time.  Skin: Skin is warm and dry. She is not diaphoretic.  Psychiatric: She has a normal mood and affect. Her behavior is normal. Judgment and thought content normal.       Assessment & Plan:

## 2010-05-16 NOTE — Assessment & Plan Note (Signed)
In the past, she has declined to take oral biphosphonates. Cost of other meds is an issue Currently on calcium, vitamin D. Recommend be as active as she can

## 2010-05-16 NOTE — Assessment & Plan Note (Signed)
self discontinue losartan 03-2010 because she thought that was causing dizziness. BP currently okay. No change

## 2010-05-16 NOTE — Assessment & Plan Note (Signed)
Followup by cardiology, currently asymptomatic

## 2010-05-28 NOTE — Progress Notes (Signed)
[facility]                        PERIPHERAL VASCULAR OFFICE NOTE   BERNADETTA, ROELL                      MRN:          [id number]  DATE:11/16/2007                            DOB:          01-16-1922    Ms. Schriner is an 77 year old woman with lower extremity peripheral  arterial disease.  She had bilateral leg weakness and calf claudication  and was treated with stenting of her superficial femoral arteries back  in 2007 by Dr. Samule Ohm.  She has had resolution of her leg pain since  that time.  She is limited by osteoarthritis and has pain in her knees  and hips.  She has had no rest pain or ischemic ulcerations.  Her last  ABIs were checked in 2008 and was 0.9 on the right and 0.8 on the left.  She has no complaints on evaluation today.   MEDICATIONS:  1. Amlodipine 5 mg daily.  2. Prilosec 20 mg daily.  3. Aspirin 325 mg daily.  4. Vitamin C.  5. Vitamin D.   ALLERGIES:  PENICILLIN.   DRUG INTOLERANCE:  ANTIHISTAMINES.   PHYSICAL EXAMINATION:  GENERAL:  She is alert and oriented elderly  woman, in no acute distress.  VITAL SIGNS:  Weight 157 pounds, blood pressure 162/80, heart rate 80,  and respiratory rate 16.  HEENT:  Normal.  NECK:  Normal carotid upstrokes, no bruits.  JVP normal.  LUNGS:  Clear bilaterally.  HEART:  Regular rate and rhythm with a soft systolic ejection murmur  along left sternal border.  ABDOMEN:  Soft, nontender, no organomegaly.  No abdominal bruits.  EXTREMITIES:  There is no clubbing, cyanosis, or edema.  Pulses in the  right foot are not palpable.  The left DP is 1+.  PT is not palpable on  the left.  SKIN:  Warm and dry.  There is no rash.   ASSESSMENT:  Lower extremity peripheral arterial disease with prior  superficial femoral angioplasty with prior stenting of the bilateral  superficial femoral arteries and right tibioperoneal trunk.  The patient  remains asymptomatic.  Continue her current medical  program.  She does  not tolerate many medications and does not wish to escalate her  antihypertensive therapy.  She will continue to follow with Dr. Drue Novel.  I  would like to see her back in 1 year.     Veverly Fells. Excell Seltzer, MD  Electronically Signed    MDC/MedQ  DD: 11/16/2007  DT: 11/17/2007  Job #: [id number]   cc:   Willow Ora, MD

## 2010-05-28 NOTE — Discharge Summary (Signed)
NAMEMIRIAM, [name]               ACCOUNT NO.:  [id number]   MEDICAL RECORD NO.:  [id number]          PATIENT TYPE:  OIB   LOCATION:  2899                         FACILITY:  [facility]   PHYSICIAN:  Ashley Mirza, PA   DATE OF BIRTH:  Nov 05, 1922   DATE OF ADMISSION:  10/02/2006  DATE OF DISCHARGE:  10/02/2006                               DISCHARGE SUMMARY   FINAL DIAGNOSIS:  Explant of existing pacemaker which is at elective  replacement indicator with implant of [employer] ALTRUA dual-  chamber pacemaker, Dr. Lewayne Bunting.   SECONDARY DIAGNOSES:  1. History of symptomatic complete heart block, status post pacemaker      implantation.  2. Pacemaker found to be at elective replacement indicator.  3. Hypertension, possibly poor control.   PROCEDURE:  Explant of existing dual-chamber pacemaker with implant of a  [employer] ALTRUA 60 dual-chamber generator, Dr. Lewayne Bunting.  The patient goes home on antibiotic therapy for 5 days and on her  regular medications.  She was given clonidine as well as her regular  daily medication for blood pressure 200 systolic in the short-stay  recovery area.  The patient is asked to keep her incision dry for next 7  days, to sponge-bathe until Friday, September 26 in the morning.  Saturday, September 20th, she is to remove the bandage and leave the  incision open to air.  She is not to put any cream or on it.   MEDICATIONS AT DISCHARGE:  1. Amlodipine 5 mg daily.  2. Prilosec 20 mg daily.  3. Enteric-coated aspirin 81 mg daily.  4. A new medication, an antibiotic, Keflex 250-mg tablets, 1 tab one-      half hour before breakfast, lunch, dinner, bedtime for 5 days.   FOLLOWUP:  [facility], [address].  1. To see Dr. Excell Seltzer Friday, October 3rd at 3:00.  2. Pacer Clinic Monday, October 6th at 9.   BRIEF HISTORY:  [name] is an 77 year old female.  She has a history of  symptomatic complete heart block.  She has  a pacemaker.  It is now at  elective replacement indicator.  She denies pain or shortness of breath;  has no specific complaints.  This was picked up on transtelephonic  monitoring.  Dr. Ladona Ridgel has recommended generator change.  The risks,  benefits, goals have been described to the patient.   HOSPITAL COURSE:  The patient presented electively.  She underwent  explantation of her existing device with successful implant of the  [facility] Scientific dual-chamber device.  No postprocedural complications.  No hematoma.  The patient discharging on her regular medicine with  antibiotic.      Ashley Mirza, PA     GM/MEDQ  D:  10/02/2006  T:  10/03/2006  Job:  [id number]   cc:   Doylene Canning. Ladona Ridgel, MD

## 2010-05-28 NOTE — Assessment & Plan Note (Signed)
[facility]                         ELECTROPHYSIOLOGY OFFICE NOTE   [name], [name]                      MRN:          [id number]  DATE:03/02/2007                            DOB:          [birth year]    [name] returns today for follow-up.  She is a very pleasant elderly  woman with a history of intermittent complete heart block who is status  post pacemaker insertion many years ago, who underwent generator change-  out back in September 2008.  She returns today for follow-up.  The  patient notes that she has occasional nausea and indigestion.  She also  complains of some arthritic symptoms.  Otherwise had no specific  complaints today.   PHYSICAL EXAMINATION:  She is a pleasant elderly woman in no distress.  VITAL SIGNS:  Blood pressure was 180/82, on recheck it was 160/94,  respirations were 18, pulse was 71 beats per minute and regular  NECK:  No jugular distention.  LUNGS:  Clear bilaterally to auscultation.  No wheezes, rales or rhonchi  are present.  CARDIOVASCULAR:  Regular rhythm with normal S1 and a split S2.  ABDOMINAL:  Soft, nontender.  EXTREMITIES:  No edema.   Interrogation of the pacemaker demonstrates a [employer] Altrua, the P-  waves were 2 as were the R-waves.  The impedance 530 in the A, 520 in  the V, the threshold 0.9 at 0.4 in the A and 0.8 at 0.4 in the V.  She  was 21% A paced.   IMPRESSION:  1. Hypertension.  2. Intermittent complete heart block.  3. Status post pacemaker insertion.   DISCUSSION:  Overall [name] is stable.  Her pacemaker is working  satisfactorily.  She has chronic low R-waves.  Will plan to see her back  for pacemaker follow-up in September 2009.     Doylene Canning. Ladona Ridgel, MD  Electronically Signed    GWT/MedQ  DD: 03/02/2007  DT: 03/03/2007  Patterson #: [id number]

## 2010-05-28 NOTE — Assessment & Plan Note (Signed)
[facility]                         ELECTROPHYSIOLOGY OFFICE NOTE   MAEVA, DANT                      MRN:          [id number]  DATE:09/22/2006                            DOB:          02/03/1922    Ms. Scruggs returns today for followup.  She is a very pleasant elderly  woman with the history of symptomatic complete heart block, status post  pacemaker insertion, who returns today for followup.  She denies chest  pain or shortness of breath and has had no specific complaints.   On transtelephonic monitoring, she was noted to reach ERI several weeks  ago.  She denies chest pain.  She denies shortness of breath and has  otherwise been stable.   PHYSICAL EXAM:  Notable for a pleasant, well-appearing woman in no  distress.  Blood pressure 130/80, the pulse 80 and regular, respirations were 16,  the weight was 161 pounds.  NECK:  Revealed no jugular venous distention.  There is no thyromegaly.  LUNGS:  Clear bilaterally to auscultation, no wheezes, rales or rhonchi.  CARDIOVASCULAR EXAM:  Revealed a regular rate and rhythm with normal S1  and a split S2.  ABDOMINAL EXAM:  Soft, nontender.  EXTREMITIES:  Demonstrated no cyanosis, clubbing or edema.   MEDICATIONS:  Aspirin, amlodipine, calcium and Prilosec.   IMPRESSION:  1. Complete heart block and symptomatic bradycardia.  2. Status post pacemaker insertion, now at [facility].   DISCUSSION:  I have recommended generator change on the patient's  pacemaker.  The risks, benefits, goals and expectations have been  discussed with the patient.  She wishes to proceed.  This will be  scheduled at the earliest possible convenient time.     Doylene Canning. Ladona Ridgel, MD  Electronically Signed    GWT/MedQ  DD: 09/22/2006  DT: 09/23/2006  Job #: [id number]   cc:   Willow Ora, MD

## 2010-05-28 NOTE — Progress Notes (Signed)
[facility]                        PERIPHERAL VASCULAR OFFICE NOTE   SHEALYN, SEAN                      MRN:          [id number]  DATE:10/16/2006                            DOB:          [birth year]    Kerman Passey returns for follow-up at the [facility] peripheral vascular  office on October 16, 2006.  Ms. Whitsel is an 77 year old woman with  peripheral arterial disease who was diagnosed after complaining of  bilateral calf claudication.  Dr. Samule Ohm performed stenting of her  superficial femoral arteries back in summer of 2007 and also stented her  right tibial perineal trunk.  She is considerably better since then and  her symptoms have resolved.  She has bilateral knee pain from  osteoarthritis but denies any claudication symptoms.  She does not have  rest pain and has had no problems with ischemic ulcers.  Her most recent  ABIs  were in July of this year and they were 0.89 on the right and 0.78  on the left.  She did have an increased velocity in her left popliteal  artery.   CURRENT MEDICATIONS:  1. Aspirin 81 mg.  2. Amlodipine 5 mg.  3. Prilosec 20 mg daily.   PHYSICAL EXAMINATION:  She is an alert and oriented elderly woman, no  acute distress.  Weight is 163 pounds, blood pressure is 158/78 on the right and 160/80  on the left, heart rate 76, respiratory rate 12.  HEENT:  Normal.  NECK:  Normal carotid upstrokes without bruits.  Jugular venous pressure  is normal.  LUNGS:  Clear to auscultation bilaterally.  HEART:  Regular rate and rhythm with a brief systolic ejection murmur at  the left sternal border.  ABDOMEN:  Soft, nontender, no bruits.  No organomegaly.  EXTREMITIES:  No clubbing, cyanosis, or edema.  Dorsalis pedis pulses  are 1+.  I am not able to palpate posterior tibial pulses.   ASSESSMENT:  Ms. Kim is stable regarding her peripheral arterial  disease.  I encouraged her to continue with aspirin and a walking  program.   She has not tolerated aggressive treatment of her  hypertension.  She has also not tolerated Statins.  She is followed  closely by Dr. Drue Novel and I will defer her medical therapy to him.  I would  like to see her back in one year with a follow-up ABI in June of 2009.  If she has problems suggestive of ischemic leg pain, I would obviously  be happy to see her at any time.     Veverly Fells. Excell Seltzer, MD  Electronically Signed    MDC/MedQ  DD: 10/16/2006  DT: 10/17/2006  Job #: [id number]   cc:   Willow Ora, MD

## 2010-05-28 NOTE — Assessment & Plan Note (Signed)
[facility]                         ELECTROPHYSIOLOGY OFFICE NOTE   TESSICA, CUPO                      MRN:          [id number]  DATE:03/09/2008                            DOB:          30-Oct-1922    Ms. Marseille returns today for followup.  She is a very pleasant elderly  woman with complete heart block and pacemaker, also with hypertension.  She has fairly severe arthritis.  She denies chest pain or shortness of  breath.  She has done extremely well in the last year.  She is followed  by Dr. Drue Novel for chronic cough.   CURRENT MEDICATIONS:  1. Amlodipine 5 a day.  2. Prilosec 20 a day.  3. Vitamin C and D daily.  4. Aspirin 81 a day.   PHYSICAL EXAMINATION:  GENERAL:  She is a pleasant, elderly appearing  woman, in no distress.  VITAL SIGNS:  Blood pressure 120/70, pulse 92 and regular, respirations  18.  The weight was 157 pounds.  NECK:  No jugular venous distention.  LUNGS:  Clear bilaterally to auscultation.  No wheezes, rales, or  rhonchi are present.  No increased work of breathing.  CARDIOVASCULAR:  Regular rate and rhythm.  Normal S1 and S2.  ABDOMEN:  Soft, nontender.  EXTREMITIES:  Demonstrated no edema.   Interrogation of her pacemaker demonstrates [employer].  P-  waves were 2.  The R-waves were 4.  The impedance 640 in the A, 513 in  the V.  The threshold 0.8 at 0.4 in the A and 0.9 at 0.4 in the RV.  Battery at the beginning of life, she was pacing 16% in the A, 80% in  the V.   IMPRESSION:  1. History of symptomatic bradycardia, history of complete heart      block.  2. Hypertension.  3. Status post pacemaker insertion.   DISCUSSION:  Ms. Whan is stable.  Her pacemaker is working normally.  I  plan to see the patient back for followup in approximately 1 year.     Doylene Canning. Ladona Ridgel, MD  Electronically Signed    GWT/MedQ  DD: 03/09/2008  DT: 03/10/2008  Job #: [id number]

## 2010-05-28 NOTE — Op Note (Signed)
NAMEANNETH, BRUNELL               ACCOUNT NO.:  [id number]   MEDICAL RECORD NO.:  [id number]          PATIENT TYPE:  OIB   LOCATION:  2899                         FACILITY:  [facility]   PHYSICIAN:  Doylene Canning. Ladona Ridgel, MD    DATE OF BIRTH:  [birth year]   DATE OF PROCEDURE:  10/02/2006  DATE OF DISCHARGE:                               OPERATIVE REPORT   PROCEDURE PERFORMED:  Removal of a previously implanted pacemaker which  had reached elective replacement indicator and insertion of a new dual-  chamber pacemaker.   I. INDICATIONS:  Symptomatic bradycardia, status post pacemaker  insertion.   II. PROCEDURE:  After informed consent was obtained, the patient was  taken to the diagnostic EP lab in the fasting state.  After the usual  preparation and draping, intravenous fentanyl and midazolam were given  for sedation.  Thirty milliliters of lidocaine were infiltrated in the  left infraclavicular region.  A 5-cm incision was carried out over this  region and electrocautery utilized to dissect down the fascial plane.  The pacemaker pocket was entered without difficulty.  The old Guidant  dual-chamber pacemaker (Discovery II) was removed and the new Guidant  dual-chamber pacemaker was inserted.  Prior to this, the atrial and  ventricular leads were evaluated.  The P waves measured 2.5 mV. The R  waves were 3 mV (unipolar).  The threshold was 1.6 volts in the atrium  and less than 1 volt in the ventricle.  With these satisfactory  parameters, the new Guidant pacemaker was connected to the old pacing  leads and placed back in the subcutaneous pocket.  The generator was  secured with a silk suture.  The pocket was irrigated with kanamycin and  the incision then closed with a layer of 2-0 Vicryl followed by a layer  of 3-0 Vicryl and followed by a layer of 4-0 Vicryl.  Benzoin was  painted on skin, Steri-Strips were applied and a pressure dressing was  placed and the patient was returned to her room  in satisfactory  condition.   III. COMPLICATIONS:  There were no immediate procedure complications.   IV. RESULTS:  This demonstrated successful removal of a previously  implanted Guidant dual-chamber pacemaker and insertion of a new Guidant  dual-chamber pacemaker in a patient with symptomatic bradycardia, status  post pacemaker and ERI.      Doylene Canning. Ladona Ridgel, MD  Electronically Signed     GWT/MEDQ  D:  10/02/2006  T:  10/03/2006  Job:  [id number]

## 2010-05-31 NOTE — Discharge Summary (Signed)
NAMEINIYA, MATZEK               ACCOUNT NO.:  [id number]   MEDICAL RECORD NO.:  [id number]          PATIENT TYPE:  OIB   LOCATION:  6527                         FACILITY:  [facility]   PHYSICIAN:  Salvadore Farber, M.D. LHCDATE OF BIRTH:  December 22, 1922   DATE OF ADMISSION:  07/02/2005  DATE OF DISCHARGE:  07/03/2005                                 DISCHARGE SUMMARY   PRIMARY CARDIOLOGIST:  Dr. Samule Ohm   PRIMARY CARE PHYSICIAN:  Dr. Willow Ora   PRINCIPAL DIAGNOSIS:  Peripheral vascular disease with claudication.   OTHER DIAGNOSES:  1.  History of complete heart block status post permanent pacemaker      placement in 2001.  2.  Hypertension.  3.  Presumed ischemic cardiomyopathy with an ejection fraction of 35-45%      with multiple regional wall motion abnormalities.  4.  Mild aortic insufficiency.  5.  Mild mitral regurgitation.   ALLERGIES:  PENICILLIN.   PROCEDURES:  Lower extremity angiography with percutaneous transluminal  angioplasty and stenting of the left superficial femoral artery and  percutaneous transluminal angioplasty of the left popliteal artery.   HISTORY OF PRESENT ILLNESS:  An 77 year old African-American female with  with a several year history of left leg claudication who had abnormal ABIs  in April of 2007 with an ABI on the left of 0.53 and 0.73 on the right.  She  has struggled to comply with any exercise therapy due to pain with walking  and thus decision was made to pursue peripheral angiography plus or minus  PTA.   HOSPITAL COURSE:  The patient presented to the peripheral vascular  laboratory on June 20 and underwent peripheral angiography revealing a 60-  70% stenosis in the right superficial femoral artery with mild disease in  the right popliteal.  The left superficial femoral artery had a 95% stenosis  which was successfully treated with a 6 x 80 mm Everflex stent.  She also  had a 70% stenosis in the left popliteal artery which was successfully  treated with POBA.  She tolerated this procedure well and this morning has  been ambulating without recurrent claudication or groin complications.  She  is being discharged home today in satisfactory condition.   Her creatinine has remained stable post procedure and given her history of  ischemic cardiomyopathy we will initiate ACE inhibitor therapy on discharge.  She will follow up for a BMET in one week.   DISCHARGE LABORATORIES:  Hemoglobin 10.3, hematocrit 29.6, WBC 7.6,  platelets 210.  Sodium 140, potassium 3.8, chloride 112, CO2 25, BUN 14,  creatinine 0.8, glucose 69, calcium 8.4.   DISPOSITION:  The patient is being discharged home today in good condition.   FOLLOW-UP PLANS AND APPOINTMENTS:  She will have follow-up blood chemistry  at [facility] on July 10, 2005 10:30 a.m.  She has a follow-up  arterial brachial indices on July 20 at 1:30 p.m.  This will then be  followed by a visit with Dr. Samule Ohm on July 20 at 2:15 p.m.  She is asked to  see Dr. Drue Novel in the next three to  four weeks.   DISCHARGE MEDICATIONS:  1.  Aspirin 325 mg daily.  2.  Plavix 75 mg daily.  3.  Fosamax 70 mg every week.  4.  Prilosec OTC 20 mg daily.  5.  Lipitor 10 mg daily.  6.  Coenzyme Q-10 50 mg daily.  7.  Norvasc 5 mg daily.  8.  Calcium plus D daily.  9.  Lisinopril 5 mg daily.   OUTSTANDING LABORATORY STUDIES:  None.   DURATION OF DISCHARGE ENCOUNTER:  45 minutes including physician time.      Ok Anis, NP      Salvadore Farber, M.D. [facility]  Electronically Signed    CRB/MEDQ  D:  07/03/2005  T:  07/03/2005  Job:  [id number]   cc:   Wanda Plump, MD [facility]  [phone] [address] [place], [place] [address]

## 2010-05-31 NOTE — Op Note (Signed)
Ashley, Patterson               ACCOUNT NO.:  [id number]   MEDICAL RECORD NO.:  [id number]          PATIENT TYPE:  AMB   LOCATION:  SDS                          FACILITY:  [facility]   PHYSICIAN:  Salvadore Farber, M.D. LHCDATE OF BIRTH:  1922/07/07   DATE OF PROCEDURE:  07/11/2005  DATE OF DISCHARGE:                                 OPERATIVE REPORT   PROCEDURE:  Balloon angioplasty and stenting of the right superficial  femoral artery.   INDICATIONS FOR PROCEDURE:  [name] is an 77 year old lady who presented  with severe lifestyle-limiting claudication bilaterally.  One week ago  I  performed bilateral lower extremity angiography and stenting of the left  SFA.  Symptoms have completely resolve on the left but remain lifestyle-  limiting on the right.  Duplex ultrasound demonstrated a focal stenosis on  the right in the mid SFA with peak systolic velocity of greater than 400  cm/sec.  ABI on the right is 0.73.  She presents today for planned balloon  angioplasty with possible provisional stenting of the focal lesion right  SFA.   PROCEDURAL TECHNIQUE:  Informed consent was obtained.  Due to extreme  tortuosity of the iliac vessels and narrow bifurcation of the aorta, we  elected antegrade access.  Using a Smart needle, we obtained placed a sheath  in the right common femoral artery extending into the right SFA.  Due to the  calcified nature of the vessel, there was some difficulty obtaining access  resulting in a modest amount of blood loss.  Angiography was then performed  by hand injection.  We then proceeded to balloon angioplasty of the proximal  of two mid SFA lesions.  I began with anticoagulation using 5500 units of  heparin.  After the first 4000 ACT was 193.  An additional 1500 units was  administered.  I advanced a Wholey wire across the lesion without  difficulty.  I then proceeded with balloon angioplasty using a 5 x 20 mm  Powerflex at 8 atmospheres.  We then  performed additional balloon  angioplasty of the lesion 4 cm distally at 6 atmospheres for 125 seconds.  Repeat angiography demonstrated a deep dissection of the proximal lesion and  a superficial dissection of the more distal lesion.  Given the severe  residual stenosis and deep dissection of the proximal lesion, I did not  think it would respond to further balloon angioplasty.  I therefore  proceeded to stenting.  I debated whether to cover both lesions with the  stent or simply spot stent the more proximal one.  Ultimately I decided not  to leave behind the dissection of the lower vessel out of concern that I  would land a stent in a heavily diseased segment with dissection just  distally.  I therefore deployed a 7 x 100 mm EverFlex stent across the  lesion.  I post dilated the stent using a 6 x 80 mm Powerflex at 6  atmospheres.  Final angiography demonstrated no residual stenosis, no  residual dissection, and normal flow.  Runoff was unchanged.   IMPRESSION AND PLAN:  Successful revascularization of the right superficial  femoral artery.  Will plan discharge later today.  She will follow up in 2  weeks with ABIs.      Salvadore Farber, M.D. [facility]  Electronically Signed     WED/MEDQ  D:  07/11/2005  T:  07/11/2005  Job:  [id number]   cc:   Wanda Plump, MD [facility]  [phone] [address] [place], [place] [address]

## 2010-05-31 NOTE — Assessment & Plan Note (Signed)
[facility]                         ELECTROPHYSIOLOGY OFFICE NOTE   MARIGNY, BORRE                      MRN:          [id number]  DATE:02/12/2006                            DOB:          May 15, 1922    [name] was seen today in the clinic on February 12, 2006, for follow up  of her Guidant model number [id number] Discovery.  Date of implant was March 24, 2000, for second degree heart block.   On interrogation of her device today, her battery voltage is good with  10% remaining.  P-waves measured 2.3 mV, R-waves measured 4 mV with a  ventricular pacing threshold of 0.9 volts at 0.5 milliseconds and a  ventricular lead impedance of 460 ohms.  No changes were made in her  parameters.  She is programmed to the VDD mode at a lower rate of 60.  She will continue with her monthly telephone checks.  I have cautioned  [name] on the fact that her battery is close to change out and that we  need to continue to check her by phone on a monthly basis.      Ashley Harm, LPN  Electronically Signed      Doylene Canning. Ladona Ridgel, MD  Electronically Signed   PO/MedQ  DD: 02/12/2006  DT: 02/12/2006  Job #: [id number]

## 2010-05-31 NOTE — Discharge Summary (Signed)
[place]  Patient:    [name], [name] Visit Number: [id number] MRN: [id number]          Service Type: MED Location: 2000 2016 01 Attending Physician:  Dorena Cookey Dictated by:   Titus Dubin. Alwyn Ren, M.D. [facility] Admit Date:  12/07/2000 Discharge Date: 12/08/2000   CC:         Claretta Fraise, M.D. [facility]  Doylene Canning. Ladona Ridgel, M.D. [facility]   Discharge Summary  ADMITTING IMPRESSION: 1. Atypical chest pain. 2. Hypertension. 3. Dyspepsia. 4. Osteopenia.  DISCHARGE DIAGNOSES: 1. Atypical chest pain. 2. Hypertension. 3. Dyspepsia. 4. Osteopenia.  PROCEDURES:  Adenosine stress echocardiogram.  HISTORY OF PRESENT ILLNESS:  The patient is a 77 year old female who presented for routine follow-up but was found to be having intermittent chest pain over the previous week.  It was described as epigastric, like gas pressure.  It was worse with lying on her left side and would last ten to 15 minutes.  She does sleep on two pillows but denies paroxysmal nocturnal dyspnea.  Her lipid panel in 2/02 showed total cholesterol of 215 with an HDL of 49.3 and an LDL of 324.  Dietary recommendations had been made by Dr. Baldo Daub at that time.  She had been on Protonix, but this was not initiated apparently related to financial concerns.  HOSPITAL COURSE:  The patient was admitted for the atypical chest pain.  This was in the context of an EKG which showed T-wave inversions in lead II, III, and aVF not seen on an EKG done by [facility] cardiology in 9/02.  There was no definite ST-T wave segment elevation.  She was admitted to the cardiac floor unit.  Altace, Zocor, and Protonix were added to her regimen.  She had rapid resolution of her symptoms.  She was seen in consultation by [facility].  It was their recommendation that the ________ be added to the present regimen.  The Cardiolite study was performed which revealed no definite ischemic  change. Ejection fraction was 45%.  There was decreased motion and thickening of the septum.  The patient was asymptomatic and desired to go home.  For this reason, she was discharged on the Altace and ________.  Because of the issues noted in the history, she will be asked to pick up samples of Protonix and Zocor and arrangements made for follow up on her lipids and liver panel after approximately ten weeks.  Admission labs did reveal a potassium of 5.4, but repeat value was 3.8.  The specimen on admission was slightly hemolyzed.  Her SGOT was 70; again Dr. Baldo Daub can decide whether she wants to continue the Zocor as an outpatient.  Troponin was 0.02, and total CPK was 212 with an MB of 1.8.  CBC and differential was normal.  EKG revealed normal sinus rhythm with a short PR interval with frequent premature fusion complexes with left bundle branch block.  DISCHARGE MEDICATIONS:  She was discharged on her admission medications of: 1. Multivitamins. 2. Calcium supplement with vitamin D. 3. Norvasc 5 mg daily. 4. Altace 2.5 mg daily. 5. Zebeta 2.5 mg daily.  FOLLOW-UP APPOINTMENT:  She will be asked to see Dr. Baldo Daub in seven to ten days to discuss continuing the Zocor in view of SGOT of 70.  Issues concerning involvement in the MAP program or other financial aid can be discussed at that time in reference to the Protonix or other protein pump inhibitor.  CONDITION ON DISCHARGE:  Improved.  PROGNOSIS:  Good.  DISCHARGE DIET:  No-added salt and low fat. Dictated by:   Titus Dubin. Alwyn Ren, M.D. [facility] Attending Physician:  Dorena Cookey DD:  12/08/00 TD:  12/09/00 Job: [id number]

## 2010-05-31 NOTE — Progress Notes (Signed)
[facility]                          PERIPHERAL VASCULAR OFFICE NOTE   BELEN, PESCH                        MRN:          [id number]  DATE:  07/24/2005                              DOB:      May 08, 1922    HISTORY OF PRESENT ILLNESS:  Ms. Scafidi is an 77 year old lady who had  lifestyle limiting claudication due to bilateral SFA disease.  I performed  stenting of both SFAs in two separate procedures.  The right was done two  weeks ago.  She says her left leg is essentially asymptomatic.  However, her  right leg has not improved nearly as much.  She says her calf continues to  ache with walking as does her knee.  She struggles to differentiate these  two pains.  She has not had any rest pain in either leg.  The right ABI is  improved from 0.73 to only 0.79.  The left has improved from 0.53 to 0.83.   CURRENT MEDICATIONS:  1.  Aspirin 81 mg per day.  2.  Plavix 75 mg per day.  3.  Omeprazole 20 mg per day.  4.  Amlodipine 5 mg per day.  5.  Multivitamin.  6.  Lipitor 10 mg per day.  7.  Lisinopril 5 mg per day.  8.  Calcium plus D.  9.  Fosamax 70 mg once per week.  10. Iron sulfate 325 mg per day.  11. Colace.   PHYSICAL EXAMINATION:  GENERAL:  She is generally well-appearing in no  distress.  VITAL SIGNS:  Heart rate 76, blood pressure 124/72 and equal bilaterally.  Weight is 167 pounds which is stable.  SKIN:  Remarkable for diffuse scaling without erythema or rash.  MUSCULOSKELETAL:  Normal with the exception of crepitus in both knees.  NECK:  She has no jugular venous distention and no thyromegaly.  LYMPH NODE:  There is no cervical, supraclavicular, or axillary  lymphadenopathy.  LUNGS:  Respiratory effort is normal.  Lungs are clear to auscultation.  CARDIAC:  She has a nondisplaced point of maximal point of cardiac impulse.  There is a regular rate and rhythm without murmurs, rubs, or gallops.  ABDOMEN:  Soft, nondistended,  nontender.  There is no hepatosplenomegaly.  Bowel sounds are normal.  EXTREMITIES:  Warm without clubbing, cyanosis, edema, or tissue loss.  Carotid pulses are 2+ bilaterally without bruit.  Femoral pulses 2+  bilaterally without bruit.  There is some mild ecchymosis around the right  femoral access site, but no hematoma.  There is no abdominal bruit and no  pulsatile midline mass in the abdomen.  Left posterior tibial pulse 1+.  PT  pulse easily dopplerable on the right.  Difficult to feel both popliteal  pulses due to the size of her leg.  However, both are easily dopplerable.   IMPRESSION/RECOMMENDATIONS:  1.  Bilateral lower extremity vascular disease:  Left is much improved with      ABI greatly improved and now in the mild range.  The right, however, is      not substantially improved symptomatically or by ABI.  Will bring  her      back for duplex to assure patency of the stent.  It is possible that her      limiting lesion would appear to be angiographically a moderate stenosis      of the origin of the tibial peroneal trunk in the setting of an occluded      anterior tibial.  If this is the case I would plan on conservative      management.  I will check the duplex and then re-assess.  2.  Presume ischemic cardiomyopathy, tolerating ACE inhibitor.  Continue      aspirin.                                   Salvadore Farber, MD   WED/MedQ  DD:  07/24/2005  DT:  07/24/2005  Job #:  [id number]   cc:   Willow Ora, MD

## 2010-05-31 NOTE — Consult Note (Signed)
[place]. [facility]  Patient:    [name], [name] Visit Number: [id number] MRN: [id number]          Service Type: MED Location: 2000 2016 01 Attending Physician:  Dorena Cookey Dictated by:   Lewayne Bunting, M.D. [facility] Proc. Date: 12/07/00 Admit Date:  12/07/2000   CC:         Claretta Fraise, M.D. [facility]  Rudene Christians. Ladona Ridgel, M.D. [facility]   Consultation Report  REFERRING PHYSICIAN:  Claretta Fraise, M.D.  CURRENT COMPLAINT:  Chest pressure over the last several weeks when laying down.  HISTORY OF PRESENT ILLNESS:  [name] is a 77 year old African-American female with few cardiac risk factors.  The patient reports substernal chest pressure of one week duration.  She reports this occurs when she goes to bed at night and lays down.  At times she has to roll over to the right side as laying on the left side appears to worsen the chest pressure.  There is no exertional component involved with her chest pain.  She does have difficulty getting up and about due to osteoarthritis in the lower extremities.  However, she is able to mop her floor and do her usual house chores without substernal chest pain.  She denies orthopnea and PND.  She also reports no palpitations or syncope.  She is followed by Dr. Ladona Ridgel in the office due to prior pacemaker placement.  During the examination patient complained of some mild pressure while she was eating.  ALLERGIES:  PENICILLIN causes rash.  MEDICATIONS: 1. Norvasc 5 mg q.d. 2. Altace 2.5 mg q.d. 3. Zocor 20 mg q.d. 4. Protonix 40 mg q.d.  Cardiac risk factors:  No prior history of coronary artery disease, diabetes. She does have a history of hypertension.  Lipid status is unknown.  No recent tobacco use.  PAST MEDICAL HISTORY:  Patient did not have a prior cardiac catheterization. She is status post permanent pacemaker placement followed by Dr. Ladona Ridgel. Recently interrogated in June 2002.  SOCIAL HISTORY:  The  patient lives in [place] by herself.  She is married. Is currently widowed.  She used to smoke, but quit approximately 45 years ago. She denies alcohol use.  FAMILY HISTORY:  Mother had coronary artery disease but it is not know at which age she died.  Father past medical history is unknown.  She has one sister who has coronary artery disease.  REVIEW OF SYSTEMS:  No fever or chills.  No headache or sore throat.  No vertigo.  No rashes or skin lesions.  CARDIOPULMONARY:  Notable for chest pain, but no shortness of breath or dyspnea on exertion.  No orthopnea or PND. No cough or wheezing.  No frequency or dysuria.  She complains of bilateral knee pain.  No myalgias.  No nausea, vomiting, diarrhea.  Severe constipation. All other review of systems are negative.  PHYSICAL EXAMINATION  VITAL SIGNS:  Blood pressure 189/60, heart rate 70 beats per minute, respirations 20 per minute, temperature 98.5, weight 250 pounds.  GENERAL:  Well-nourished African-American female in no apparent distress.  HEENT:  PERRLA.  EOMI.  Sclerae:  Clear.  NECK:  Supple.  No bruit or JVD.  HEART:  Rate is irregular with normal S1.  ______ heart sound.  There is no S3.  LUNGS:  Clear breath sounds bilaterally with no wheezing.  SKIN:  No rashes or lesions.  ABDOMEN:  Soft, nontender.  No hepatosplenomegaly.  GENITOURINARY:  Deferred.  RECTAL:  Deferred.  EXTREMITIES:  No cyanosis, clubbing, or edema.  NEUROLOGIC:  Patient alert, oriented, and grossly nonfocal.  LABORATORIES:  Chest x-ray is pending.  A 12-lead electrocardiogram: Ventricular paced rhythm, occasional PVCs, underlying rhythm is sinus bradycardia.  No acute ischemic changes.  Hemoglobin 12.8, hematocrit 37.7, white count 9.4, platelets 232,000.  Sodium 136, potassium 5.4, BUN 8, creatinine 0.8.  Liver function test:  AST 70, ALT 20.  Troponin 0.02, CK 212, CK-MB 1.8.  ASSESSMENT AND PLAN: 1. Substernal chest pressure.  The  patients chest pain is rather atypical for    coronary insufficiency.  She has few cardiac risk factors short of her    family history of coronary artery disease.  Multimarker assessment with    high sensitivity CRP, BNP, and serial troponins would be very useful.  If    all her markers are within normal limits, I suspect the patients    likelihood of significant coronary disease is extremely low.  Furthermore,    she then can have her stress test done as an outpatient.  However, is there    is positivity of the multimarker assessment, would recommend an Adenosine    Cardiolite to be performed in the morning as an inpatient.  Agree with    current medications including calcium channel blocker and ACE inhibitor,    although we will recommend repeat BMET tonight given a potassium of 5.4.    This could be related to ACE inhibitors.  I suspect the patients    substernal chest pressure is more gastrointestinal in nature but she will    need a formal ischemia evaluation as outlined above. 2. Hypertension, poorly controlled.  Would recommend to go up on Norvasc,    particularly now with potassium being on the high side.  Other    antihypertensive medications would ______ good, in particular a beta    blocker would be indicated.  I would also make sure the patient is taking    an aspirin once a day. 3. Status post pacemaker placement followed by Dr. Ladona Ridgel.  Will obtain EKG    with ______. 4. Hyperkalemia.  As outlined above. Dictated by:   Lewayne Bunting, M.D. [facility] Attending Physician:  Dorena Cookey DD:  12/07/00 TD:  12/07/00 Job: [id number]

## 2010-05-31 NOTE — Op Note (Signed)
NAMEJAELEEN, [name]               ACCOUNT NO.:  [id number]   MEDICAL RECORD NO.:  [id number]          PATIENT TYPE:  OIB   LOCATION:  2807                         FACILITY:  [facility]   PHYSICIAN:  Salvadore Farber, M.D. LHCDATE OF BIRTH:  Sep 03, 1922   DATE OF PROCEDURE:  07/02/2005  DATE OF DISCHARGE:                                 OPERATIVE REPORT   PROCEDURE:  Abdominal aortography with bilateral lower extremity runoff,  left superficial femoral arterial balloon angioplasty and stenting from an  antegrade approach, left popliteal balloon angioplasty.   INDICATION:  [name] as an 77 year old lady who complains of bilateral calf  claudication over approximately 2 years.  Both calves become tired with  walking less than 20 yards.  Symptoms are similar in both legs.  ABIs are  0.73 on the right, 0.53 on the left.  Duplex demonstrated severe focal  stenosis in the mid superficial femoral artery on the right and serial focal  stenoses on the left involving the mid superficial femoral artery and  proximal popliteal.  She had attempted exercise therapy with no relief.  Pletal was contraindicated due to her cardiomyopathy.  Due to the  significant lifestyle limitation imposed by her symptoms we decided to  proceed to angiography with an eye to revascularization.   PROCEDURE TECHNIQUE:  Informed consent was obtained.  Under 1% lidocaine  local anesthesia, a 5-French sheath was placed in the right common femoral  artery using modified Seldinger technique.  A pigtail catheter was advanced  to suprarenal abdominal aorta.  Abdominal aortography was performed by power  injection.  The pigtail was then pulled back to the infrarenal abdominal  aorta.  Abdominal aortography was performed by power injection with runoff  imaging to the feet bilaterally.  This demonstrated 95% stenosis of the left  mid superficial femoral artery and 70-80% stenosis of the proximal left  popliteal.  Decision was made  to proceed to percutaneous revascularization  of these.  I initially attempted to do so via access from the right common  femoral artery over the aortic bifurcation.  However, the aortic bifurcation  was extremely narrow and the iliac arteries tortuous, preventing advancement  of catheters over the aortic bifurcation.  We therefore abandoned this  approach in favor of an antegrade approach.   Anticoagulation was initiated with 4000 units of heparin to achieve an ACT  of greater than 250 seconds.  Antegrade puncture of the left common femoral  artery was performed without difficulty.  A 6-French sheath was placed using  modified Seldinger technique.  I then advanced a Wholey wire across the  lesions of the SFA without difficulty.  I began by dilating the lesion of  the mid SFA using a 4 x 60 mm Powerflex at 8 atmospheres for 90 seconds.  There was severe recoil causing greater than 50% residual stenosis.  I  therefore decided to stent using a 6 x 80 mm Everflex stent.  I then  postdilated the entirety of the stent using a 4 x 60 mm Powersail for two  inflations at 6 atmospheres.   Attention was  then turned to the lesion of the popliteal artery.  I advanced  a 4 x 20 mm Powerflex across the lesion and inflated it slowly to 8  atmospheres for a total of 120 minutes.  Final angiography demonstrated less  than about 10% residual stenosis in the popliteal and no residual stenosis  in the superficial femoral artery.  The patient tolerated the procedure well  and was transferred to holding room in stable condition.  Sheaths will be  removed there when ACT is less than 150 seconds.   COMPLICATIONS:  None.   FINDINGS:  1.  Abdominal aorta:  Normal with no evidence of aneurysm or significant      plaque.  2.  Renal arteries:  Single vessels bilaterally.  Both of which are normal.  3.  Right leg:  Tortuous common and external iliac artery without stenosis.      The internal iliac is widely  patent.  The common femoral and profunda      are normal.  The superficial femoral artery has diffuse mild disease of      the focal at least 60% stenosis in its midportion.  There is then      diffuse mild disease throughout the distal superficial femoral artery      and proximal popliteal.  The anterior tibial occludes in its mid      section.  There is two-vessel runoff to the foot via the PT and      peroneal.  4.  Left leg:  Very tortuous common and external iliac arteries without      stenosis.  The internal iliac is normal.  The profunda has 50% stenosis      proximally.  The superficial femoral artery has diffuse mild disease      proximally.  In its mid section there was a segment of approximately 60      mm in length that had 70-95% stenosis with at least 95% stenosis at its      most distal edge.  This was reduced to no residual.  The proximal      popliteal just above the patella had a 70% stenosis reduced to      approximately 10% with balloon angioplasty.  The mid popliteal has a 30%      stenosis.  The distal popliteal has a 60-70% stenosis just before the      origin of the anterior tibial.  Anterior tibial occludes distally.      There is two-vessel runoff to the foot via the PT and peroneal.   IMPRESSION/RECOMMENDATIONS:  Successful percutaneous revascularization of  the left superficial femoral artery and popliteal arteries.  She will be  maintained on Plavix for 30 days and aspirin indefinitely.  We will watch  her symptoms. Consideration of intervention on the right SFA will be guided  by her symptoms.      Salvadore Farber, M.D. [facility]  Electronically Signed     WED/MEDQ  D:  07/02/2005  T:  07/03/2005  Job:  [id number]   cc:   Ashley Plump, MD [facility]  [phone] [address] [place], [place] [address]

## 2010-05-31 NOTE — Assessment & Plan Note (Signed)
[facility]                           ELECTROPHYSIOLOGY OFFICE NOTE   KELSEE, PRESLAR                        MRN:          [id number]  DATE:09/18/2005                            DOB:          [birth year]    [name] was seen in the clinic on September 18, 2005, for follow up of her  Guidant model [phone] Discovery.  Date of implant was 03/14/2000, for high  grade heart block.  On interrogation of her device today, her battery  voltage is still reading good, although there is about 10% left on it.  P  waves measured 2.4 millivolts  with R waves measuring  3 millivolts.  That  is down from 5 millivolts at last check, which was in March of this year,  with a ventricular pacing threshold 0.9 volts, at 0.5 milliseconds.  Ventricular lead impedence of 490 ohms.  No changes were made in her  parameters.  She continues to be programmed to a VDD mode.  Since she is so  closed to ERI, we will check her battery again when she comes in for her  October appointment with Dr. Samule Ohm, and follow up 3 months from that.  There were 6 mode switch episodes noted.  No other episodes.                                   Altha Harm, LPN                                Doylene Canning. Ladona Ridgel, MD   PO/MedQ  DD:  09/18/2005  DT:  09/18/2005  Job #:  [id number]

## 2010-05-31 NOTE — H&P (Signed)
[place]. [facility]  Patient:    [name], [name] Visit Number: [id number] MRN: [id number]          Service Type: Attending:  Claretta Fraise, M.D. [facility] Dictated by:   Claretta Fraise, M.D. [facility]                           History and Physical  IDENTIFYING STATEMENT:  This is a 77 year old female patient whose primary care physician is Dr. Frederico Hamman who is here for her regular follow-up, but on further questioning has been complaining of chest pain over the past week.  Patient describes the pain as being in the epigastric area, feels like gas or pressure.  Comes on worse with lying on her left side.  It may last anywhere from 10-15 minutes.  It goes away on its own without her having to take anything.  She denies any associated symptoms of shortness of breath, diaphoresis, nausea, vomiting.  There has been no radiation of this pain either.  She has been taking her Tylenol Arthritis medicine but this pain started before she started taking the Tylenol Arthritis medication.  She has only started taking the Tylenol Arthritis pill last Wednesday or Thursday. She denies any orthopnea.  She sleeps on two pillows, not secondary to any breathing problems.  She denies any lower extremity edema.  Her last episode of chest pain was earlier this morning.  CARDIAC RISK FACTORS:  She does have hypertension.  There is some type of heart trouble in the mother; she does not know exactly and she could not really tell me the age either.  She is not a diabetic.   Her last set of cholesterol panel done in September 2002 showed a total cholesterol of 215, triglyceride 86, HDL 49.3, LDL 149 and it was recommended by me at that time to make some dietary changes, with the goal for LDL being less than 130, although it would be better to be close to 100 with a goal of HDL for greater than 50.  CURRENT MEDICATIONS: 1. Centrum Silver one a day. 2. Cod liver oil one a  day. 3. Calcium 500 mg one twice a day. 4. Vitamin D. 5. Norvasc 5 mg p.o. q.d.  I had her on Protonix; however, she has not been taking, actually has run out of it secondary to financial reasons.  ALLERGIES:  PENICILLIN, and also ACTONEL which causes her to have back pain.  PAST MEDICAL HISTORY: 1. Pacemaker - I am not exactly very sure what kind of pacemaker she has.  She    had this done back in [place].  The patient is followed by Dr. Ladona Ridgel    with [facility] for this. 2. She also has diagnosis of hypertension since September 2002. 3. History of osteopenia by bone mineral densitometry that was done this year    in August 2002. 4. History of dyspepsia. 5. History of allergic rhinitis. 6. Mild diverticulosis on colonoscopy done October 2002.  PAST SURGICAL HISTORY:   Pacemaker placed in March 2002.  In 1999 or 2000 she had a left breast biopsy done which was benign.  She has had a dental extraction.  She has had no pregnancies.  She had some kind of an eye tack surgery - the left one in 1996, the right one in 1998.  IMMUNIZATION STATUS:  She is unsure of when her last DT was.  She received her Pneumovax on August 24, 2000.  SOCIAL HISTORY:  She used to smoke one-half pack per day for 15 years and quit at the age of 65.  Does not drink alcohol.  She retired from domestic work. She has been widowed since 1998.  She moved from [place] to [place] where her nephew and niece are living in [place].  FAMILY HISTORY:  As mentioned earlier, mother had some kind of a heart trouble.  Hypertension in her sister.  No known hyperlipidemia.  No known breast, ovarian, uterine, colon, or prostate cancer.  Father died in his 18s secondary to ruptured peptic ulcer disease.  The mother, once again, died at the age of 19.  She does not know what was the cause of the heart failure.  PHYSICAL EXAMINATION:  GENERAL:  She is in no apparent distress and she is actually  pain free at this time.  VITAL SIGNS:  Her blood pressure initially was 160/72; repeat was 164/90 on left arm with a femoral cuff.  Her weight is 203.  She is afebrile.  Pulse rate of 68 and regular.  HEENT:  She has very prominent eyelids.  NECK:  She has 2+ carotid pulses, no bruits.  There is no JVD lying down in a 10-degree elevation of her bed.  LUNGS:  Clear to auscultation bilaterally without any crackles or wheezes.  HEART:  Regular rate and rhythm.  She has an occasional skipped beat.  No murmurs auscultated.  EXTREMITIES:  Radial pulses are 2+ bilaterally.  She has trace pretibial edema.  ABDOMEN:  Protuberant.  Bowel sounds are normal.  Soft, nontender.  No organomegaly.  No masses are palpated.  NEUROLOGIC:  There are no gross deficits.  LABORATORY DATA:  An EKG was obtained which showed T wave inversions in leads II, III, and aVF - which is different from her previous EKG done through cardiology in September 2002.  It looks like as though she has hyperpolarization in her anteroseptal leads, rather than a true ST segment elevation.  ASSESSMENT AND PLAN: 1. Atypical chest pain, although there is some typical nature to it, with some    changes in the EKG.  We will go ahead and admit her for rule out MI.  I    have not placed her on beta blockers since her heart rate is already    running on the low side of normal, but will go ahead and start her on an    ACE inhibitor and also Zocor for a decrease in mortality/morbidity in    this setting.  Will place her on coated aspirin which I thought I had    placed her on before for her hypertension.  Will do the serial CPKs with    troponin now and q.6h.  Will go ahead and get cardiology involved.  I    have spoken to [facility], who is going to be contacting    cardiology.  She most likely will need stress test of some sort. 2. In regards to her hypertension, will add the Altace to the Norvasc and I    most likely  will keep her on the Altace. 3. In regards to her dyspepsia, her symptoms suggest more dyspepsia.  Will go     ahead and restarted her back on her Protonix. 4. In regards to her history of osteopenia, I will have to readdress this    since she did not tolerate the Actonel and this can be done as an    outpatient.  I will try her on some Evista.Dictated by:   Claretta Fraise, M.D. [facility] Attending:  Claretta Fraise, M.D. [facility] DD:  12/07/00 TD:  12/07/00 Job: [id number]

## 2010-05-31 NOTE — Progress Notes (Signed)
[facility]                          PERIPHERAL VASCULAR OFFICE NOTE   [name], [name]                      MRN:          [id number]  DATE:10/29/2005                            DOB:          [birth year]    PRIMARY CARE PHYSICIAN:  Ashley Ora, MD.   HISTORY OF PRESENT ILLNESS:  [name] is an 77 year old lady who had  lifestyle-limiting claudication due to bilateral SFA disease.  I stented  those in June 2007.  Left leg is now asymptomatic.  Right leg claudication  is gone, but has pain in her knee.  Right ABI is 0.79 and left is 0.83 after  revascularization.  She has stopped all of her medications except aspirin 81  mg per day and Norvasc 5 mg per day.   PHYSICAL EXAMINATION:  She is generally well-appearing in no distress with  heart rate 74, blood pressure 128/82 and equal bilaterally.  Weight is 158  pounds, which is down 9 pounds from July.  She has no jugular venous  distention, and no thyromegaly.  There is cervical, supraclavicular, or  axillary lymphadenopathy.  Respiratory effort is normal.  LUNGS:  Clear to auscultation.  She has a nondisplaced point of maximal  cardiac impulse.  There is a regular rate and rhythm without murmur, rub, or  gallop.  Pacemaker in the left upper chest.  ABDOMEN:  Soft, nondistended, nontender.  No hepatosplenomegaly.  Bowel  sounds normal.  No midline pulsatile mass.  EXTREMITIES:  Warm without clubbing, cyanosis, edema, or tissue loss.  Carotid pulses 2+ bilaterally without bruit.  Left PT pulse 1+.  Right PT  and DP not palpable.   IMPRESSION/RECOMMENDATIONS:  1. Bilateral lower extremity vascular disease:  Asymptomatic after      superficial femoral artery stenting.  Due for a repeat duplex in      February.  We will schedule this.  2. Presumed ischemic cardiomyopathy:  Ejection fraction 35% to 45% on      prior echocardiogram.  Patient has stopped all her medicines because      she was feeling  badly, and attributes this to her medications.  After a      lengthy negotiation, she agrees to stop the Norvasc and substitute      lisinopril 10 mg per day for it.  She refuses to go back on the beta-      blocker and statin.  Perhaps Dr. Drue Patterson will be able to convince her to      resume these.   We will plan on seeing her back in a year.       Ashley Farber, MD   WED/MedQ DD:  10/29/2005 DT:  10/30/2005 Job #:  [id number]   cc:   Ashley Ora, MD

## 2010-07-23 ENCOUNTER — Ambulatory Visit (INDEPENDENT_AMBULATORY_CARE_PROVIDER_SITE_OTHER): Payer: Medicare Other | Admitting: Internal Medicine

## 2010-07-23 ENCOUNTER — Encounter: Payer: Self-pay | Admitting: Internal Medicine

## 2010-07-23 DIAGNOSIS — I442 Atrioventricular block, complete: Secondary | ICD-10-CM

## 2010-07-23 DIAGNOSIS — Z950 Presence of cardiac pacemaker: Secondary | ICD-10-CM

## 2010-07-23 DIAGNOSIS — I10 Essential (primary) hypertension: Secondary | ICD-10-CM

## 2010-07-23 LAB — PACEMAKER DEVICE OBSERVATION
AL AMPLITUDE: 1 mv
AL IMPEDENCE PM: 320 Ohm
ATRIAL PACING PM: 14
BAMS-0001: 170 {beats}/min
RV LEAD AMPLITUDE: 2 mv
RV LEAD IMPEDENCE PM: 470 Ohm
VENTRICULAR PACING PM: 100

## 2010-07-23 NOTE — Assessment & Plan Note (Signed)
Her device is working normally. Will recheck in several months. She does have some ventricular under sensing with chronically low R waves however her device appears to be working satisfactorily with this.

## 2010-07-23 NOTE — Assessment & Plan Note (Signed)
Her blood pressure is elevated today. I've asked her to concentrate on reducing her salt intake. She may require medical therapy if it remains high.

## 2010-07-23 NOTE — Patient Instructions (Signed)
Your physician wants you to follow-up in: 6 months with Dr Taylor You will receive a reminder letter in the mail two months in advance. If you don't receive a letter, please call our office to schedule the follow-up appointment.  

## 2010-07-23 NOTE — Progress Notes (Signed)
HPI [name] returns today for followup. She is a pleasant 77 year old woman with a history of symptomatic bradycardia, hypertension, status post permanent pacemaker insertion. She notes an approximate 10 pound weight loss in the last year. She has tried eat less in terms of fatty foods. Her appetite is still good. Allergies  Allergen Reactions  . Hydrocodone     REACTION: itching  . Penicillins     REACTION: unspecified     Current Outpatient Prescriptions  Medication Sig Dispense Refill  . acetaminophen (TYLENOL) 325 MG tablet Take 650 mg by mouth every 6 (six) hours as needed.        Marland Kitchen aspirin 81 MG tablet Take 81 mg by mouth daily.        . Calcium Carbonate-Vitamin D (CALCIUM + D PO) Take by mouth.        Marland Kitchen ibuprofen (ADVIL,MOTRIN) 200 MG tablet Take 200 mg by mouth every 6 (six) hours as needed.        Marland Kitchen KRILL OIL 1000 MG CAPS Take 1 capsule by mouth daily.        . vitamin B-12 (CYANOCOBALAMIN) 1000 MCG tablet Take 1,000 mcg by mouth daily.        Marland Kitchen DISCONTD: omeprazole (PRILOSEC) 20 MG capsule Take 20 mg by mouth daily.        Marland Kitchen DISCONTD: traMADol (ULTRAM) 50 MG tablet Take 50 mg by mouth every 6 (six) hours as needed.        Marland Kitchen DISCONTD: VITAMIN D, ERGOCALCIFEROL, PO Take by mouth.           Past Medical History  Diagnosis Date  . Hypertension   . Hyperlipidemia   . Cardiomyopathy   . Pacemaker     [place] [employer] [id number]  . PVD (peripheral vascular disease) 2007    stenting b legs 2007,   . Allergic rhinitis   . GERD (gastroesophageal reflux disease)   . Osteoporosis   . Osteoarthritis   . Gallstones     per [place] 11-2008, also no AAA    ROS:   All systems reviewed and negative except as noted in the HPI.   Past Surgical History  Procedure Date  . Pacemaker insertion     permanent   . Breast biopsy benign      Family History  Problem Relation Age of Onset  . Hypertension    . Breast cancer Neg Hx   . Colon cancer Neg Hx      History    Social History  . Marital Status: Widowed    Spouse Name: N/A    Number of Children: N/A  . Years of Education: N/A   Occupational History  . retired    Social History Main Topics  . Smoking status: Former Games developer  . Smokeless tobacco: Not on file  . Alcohol Use: No  . Drug Use: Not on file  . Sexually Active: Not on file   Other Topics Concern  . Not on file   Social History Narrative   Comes to the office frequently with her nephew [name].Marland KitchenMarland KitchenLives by her self.Marland KitchenMarland KitchenADL independent.Marland KitchenMarland KitchenStill drives.Marland KitchenMarland KitchenNo children but has family around....Marland KitchenMarland KitchenEnd of life issues discussed  Today 05-16-10, again she does not like heroic measures, intubation or CPR.     BP 196/97  Pulse 77  Ht 5\' 5"  (1.651 m)  Wt 136 lb (61.689 kg)  BMI 22.63 kg/m2 BP equal 168/88 on my exam. Physical Exam:  Elderly, well appearing NAD HEENT: Unremarkable Neck:  No JVD,  no thyromegally Lymphatics:  No adenopathy Back:  No CVA tenderness Lungs:  Clear. Well-healed pacemaker incision. HEART:  Regular rate rhythm, no murmurs, no rubs, no clicks Abd:  Flat, positive bowel sounds, no organomegally, no rebound, no guarding Ext:  2 plus pulses, no edema, no cyanosis, no clubbing Skin:  No rashes no nodules Neuro:  CN II through XII intact, motor grossly intact  DEVICE  Normal device function.  See PaceArt for details.   Assess/Plan:

## 2010-08-21 ENCOUNTER — Inpatient Hospital Stay (INDEPENDENT_AMBULATORY_CARE_PROVIDER_SITE_OTHER)
Admission: RE | Admit: 2010-08-21 | Discharge: 2010-08-21 | Disposition: A | Discharge status: Home / Self Care | Payer: Medicare Other | Source: Ambulatory Visit | Attending: Emergency Medicine | Admitting: Emergency Medicine

## 2010-08-21 ENCOUNTER — Ambulatory Visit (INDEPENDENT_AMBULATORY_CARE_PROVIDER_SITE_OTHER): Payer: Medicare Other

## 2010-08-21 DIAGNOSIS — R05 Cough: Secondary | ICD-10-CM

## 2010-08-21 DIAGNOSIS — R059 Cough, unspecified: Secondary | ICD-10-CM

## 2010-08-21 DIAGNOSIS — K5900 Constipation, unspecified: Secondary | ICD-10-CM

## 2010-08-26 ENCOUNTER — Encounter: Payer: Self-pay | Admitting: Internal Medicine

## 2010-08-26 ENCOUNTER — Ambulatory Visit (INDEPENDENT_AMBULATORY_CARE_PROVIDER_SITE_OTHER): Payer: Medicare Other | Admitting: Internal Medicine

## 2010-08-26 ENCOUNTER — Ambulatory Visit: Payer: Medicare Other | Admitting: Family Medicine

## 2010-08-26 DIAGNOSIS — K5900 Constipation, unspecified: Secondary | ICD-10-CM

## 2010-08-26 DIAGNOSIS — R9389 Abnormal findings on diagnostic imaging of other specified body structures: Secondary | ICD-10-CM | POA: Insufficient documentation

## 2010-08-26 DIAGNOSIS — R634 Abnormal weight loss: Secondary | ICD-10-CM | POA: Insufficient documentation

## 2010-08-26 DIAGNOSIS — R918 Other nonspecific abnormal finding of lung field: Secondary | ICD-10-CM

## 2010-08-26 LAB — BASIC METABOLIC PANEL
BUN: 22 mg/dL (ref 6–23)
CO2: 26 mEq/L (ref 19–32)
Chloride: 106 mEq/L (ref 96–112)
GFR: 59.86 mL/min — ABNORMAL LOW (ref >60.00)
Glucose, Bld: 82 mg/dL (ref 70–99)
Potassium: 3.6 mEq/L (ref 3.5–5.1)
Sodium: 139 mEq/L (ref 135–145)

## 2010-08-26 LAB — TSH: TSH: 0.5 u[IU]/mL (ref 0.35–5.50)

## 2010-08-26 NOTE — Assessment & Plan Note (Addendum)
See recent chest x-ray from. We'll schedule CT w/o

## 2010-08-26 NOTE — Assessment & Plan Note (Signed)
One month history of constipation, no clear etiology. Chart reviewed, had two colonoscopies 2002 and 2005 without neoplasia, no further screening is planned. She does have some diarrhea in the context of taking "castor oil" Plan: Daily MiraLax, d/c castor oil Labs

## 2010-08-26 NOTE — Progress Notes (Signed)
  Subjective:    Patient ID: [name], female    DOB: Sep 15, 1922, 77 y.o.   MRN: [id number]  HPI One month history of constipation, will only have a bowel movement if she take "castor oil", after that, she usually has some diarrhea. Has not changed her diet, not taking any new medications that I know of.  Went to urgent care with constipation and dry cough for a month, they did x-ray, it was abnormal. See assessment and plan.  Past Medical History  Diagnosis Date  . Hypertension   . Hyperlipidemia   . Cardiomyopathy   . Pacemaker     [place] [employer] [id number]  . PVD (peripheral vascular disease) 2007    stenting b legs 2007,   . Allergic rhinitis   . GERD (gastroesophageal reflux disease)   . Osteoporosis   . Osteoarthritis   . Gallstones     per [place] 11-2008, also no AAA   Past Surgical History  Procedure Date  . Pacemaker insertion     permanent   . Breast biopsy benign      Review of Systems No fever or chills, in general feels well just a little fatigued. Occasionally whitish sputum production, no hemoptysis. No blood in the stools, no nausea or vomiting. Chart is reviewed, she has lost 4 additional pounds since the last visit.     Objective:   Physical Exam  Constitutional: She is oriented to person, place, and time. She appears well-developed. No distress.       Moderate general debility  Neck:       No lymphadenopathies or supraclavicular mass  Cardiovascular: Normal rate, regular rhythm and normal heart sounds.   No murmur heard. Pulmonary/Chest: Effort normal and breath sounds normal. No respiratory distress. She has no wheezes. She has no rales.  Musculoskeletal: She exhibits no edema.  Neurological: She is alert and oriented to person, place, and time.  Skin: She is not diaphoretic.          Assessment & Plan:

## 2010-08-26 NOTE — Assessment & Plan Note (Addendum)
Ongoing weight loss for the last year. In July 2011, weight 146 today is 130. Weight loss has been gradual and she feels well. Re asses on RTC addendum: c/o a lot of aches and joint pain , thinks from DJD, no HA---->  will also check a sed rate

## 2010-08-26 NOTE — Patient Instructions (Signed)
Stop castor oil  and start MiraLax 17 g mixed with fluids every day. Please come back in one month

## 2010-08-28 ENCOUNTER — Telehealth: Payer: Self-pay | Admitting: *Deleted

## 2010-08-28 NOTE — Telephone Encounter (Signed)
Attempt to reach patient via phone. No answer/no answering machine. Will try at later time.

## 2010-08-28 NOTE — Telephone Encounter (Signed)
Message copied by Regis Bill on Wed Aug 28, 2010  4:55 PM ------      Message from: Willow Ora E      Created: Wed Aug 28, 2010  3:53 PM       Advise patient:      Labs okay      sedimentation rate done? Please f/u on that

## 2010-08-30 ENCOUNTER — Ambulatory Visit
Admission: RE | Admit: 2010-08-30 | Discharge: 2010-08-30 | Disposition: A | Discharge status: Home / Self Care | Payer: Medicare Other | Source: Ambulatory Visit | Attending: Internal Medicine | Admitting: Internal Medicine

## 2010-08-30 DIAGNOSIS — R9389 Abnormal findings on diagnostic imaging of other specified body structures: Secondary | ICD-10-CM

## 2010-09-05 ENCOUNTER — Telehealth: Payer: Self-pay | Admitting: *Deleted

## 2010-09-05 NOTE — Telephone Encounter (Signed)
LMOM for patient to call to clarify SED Rate lab ordered not drawn?

## 2010-09-09 ENCOUNTER — Emergency Department (HOSPITAL_COMMUNITY): Payer: Medicare Other

## 2010-09-09 ENCOUNTER — Encounter (HOSPITAL_COMMUNITY): Payer: Self-pay | Admitting: Radiology

## 2010-09-09 ENCOUNTER — Inpatient Hospital Stay (HOSPITAL_COMMUNITY)
Admission: EM | Admit: 2010-09-09 | Discharge: 2010-09-11 | DRG: 312 | Disposition: A | Discharge status: Home / Self Care | Payer: Medicare Other | Source: Ambulatory Visit | Attending: Internal Medicine | Admitting: Internal Medicine

## 2010-09-09 DIAGNOSIS — I442 Atrioventricular block, complete: Secondary | ICD-10-CM | POA: Diagnosis present

## 2010-09-09 DIAGNOSIS — R55 Syncope and collapse: Principal | ICD-10-CM | POA: Diagnosis present

## 2010-09-09 DIAGNOSIS — D649 Anemia, unspecified: Secondary | ICD-10-CM | POA: Diagnosis present

## 2010-09-09 DIAGNOSIS — Z9181 History of falling: Secondary | ICD-10-CM

## 2010-09-09 DIAGNOSIS — Z87891 Personal history of nicotine dependence: Secondary | ICD-10-CM

## 2010-09-09 DIAGNOSIS — E860 Dehydration: Secondary | ICD-10-CM | POA: Diagnosis present

## 2010-09-09 DIAGNOSIS — Z950 Presence of cardiac pacemaker: Secondary | ICD-10-CM

## 2010-09-09 DIAGNOSIS — I10 Essential (primary) hypertension: Secondary | ICD-10-CM | POA: Diagnosis present

## 2010-09-09 DIAGNOSIS — I739 Peripheral vascular disease, unspecified: Secondary | ICD-10-CM | POA: Diagnosis present

## 2010-09-09 LAB — DIFFERENTIAL
Basophils Absolute: 0 10*3/uL (ref 0.0–0.1)
Basophils Relative: 1 % (ref 0–1)
Eosinophils Absolute: 0.2 10*3/uL (ref 0.0–0.7)
Eosinophils Relative: 3 % (ref 0–5)
Monocytes Absolute: 0.4 10*3/uL (ref 0.1–1.0)
Monocytes Relative: 5 % (ref 3–12)
Neutro Abs: 5.1 10*3/uL (ref 1.7–7.7)

## 2010-09-09 LAB — URINALYSIS, ROUTINE W REFLEX MICROSCOPIC
Hgb urine dipstick: NEGATIVE
Nitrite: NEGATIVE
Protein, ur: NEGATIVE mg/dL
Specific Gravity, Urine: 1.01 (ref 1.005–1.030)
Urobilinogen, UA: 1 mg/dL (ref 0.0–1.0)

## 2010-09-09 LAB — BASIC METABOLIC PANEL
Calcium: 9 mg/dL (ref 8.4–10.5)
GFR calc Af Amer: 53 mL/min — ABNORMAL LOW (ref >60)
GFR calc non Af Amer: 44 mL/min — ABNORMAL LOW (ref >60)
Glucose, Bld: 98 mg/dL (ref 70–99)
Potassium: 4.9 mEq/L (ref 3.5–5.1)
Sodium: 138 mEq/L (ref 135–145)

## 2010-09-09 LAB — URINE MICROSCOPIC-ADD ON

## 2010-09-09 LAB — CBC
MCH: 29.3 pg (ref 26.0–34.0)
MCHC: 34.2 g/dL (ref 30.0–36.0)
Platelets: 251 10*3/uL (ref 150–400)
RDW: 14.5 % (ref 11.5–15.5)

## 2010-09-10 ENCOUNTER — Observation Stay (HOSPITAL_COMMUNITY): Payer: Medicare Other

## 2010-09-10 LAB — FERRITIN: Ferritin: 35 ng/mL (ref 10–291)

## 2010-09-10 LAB — CBC
Platelets: 283 10*3/uL (ref 150–400)
RBC: 4.04 MIL/uL (ref 3.87–5.11)
RDW: 14.5 % (ref 11.5–15.5)
WBC: 7.6 10*3/uL (ref 4.0–10.5)

## 2010-09-10 LAB — MAGNESIUM: Magnesium: 2.3 mg/dL (ref 1.5–2.5)

## 2010-09-10 LAB — COMPREHENSIVE METABOLIC PANEL
AST: 23 U/L (ref 0–37)
Albumin: 3.5 g/dL (ref 3.5–5.2)
BUN: 27 mg/dL — ABNORMAL HIGH (ref 6–23)
Calcium: 9.6 mg/dL (ref 8.4–10.5)
Creatinine, Ser: 0.83 mg/dL (ref 0.50–1.10)
Total Protein: 7.3 g/dL (ref 6.0–8.3)

## 2010-09-10 LAB — URINE CULTURE: Culture  Setup Time: 201208272122

## 2010-09-10 LAB — IRON AND TIBC: Iron: 22 ug/dL — ABNORMAL LOW (ref 42–135)

## 2010-09-10 LAB — CARDIAC PANEL(CRET KIN+CKTOT+MB+TROPI)
CK, MB: 2 ng/mL (ref 0.3–4.0)
CK, MB: 2.2 ng/mL (ref 0.3–4.0)
Relative Index: INVALID (ref 0.0–2.5)
Relative Index: INVALID (ref 0.0–2.5)
Relative Index: INVALID (ref 0.0–2.5)
Total CK: 92 U/L (ref 7–177)
Troponin I: <0.3 ng/mL (ref <0.30)
Troponin I: <0.3 ng/mL (ref <0.30)

## 2010-09-10 LAB — VITAMIN B12: Vitamin B-12: >2000 pg/mL — ABNORMAL HIGH (ref 211–911)

## 2010-09-10 LAB — TSH: TSH: 0.911 u[IU]/mL (ref 0.350–4.500)

## 2010-09-10 LAB — FOLATE: Folate: 11.1 ng/mL

## 2010-09-11 LAB — BASIC METABOLIC PANEL
BUN: 22 mg/dL (ref 6–23)
CO2: 23 mEq/L (ref 19–32)
Calcium: 8.8 mg/dL (ref 8.4–10.5)
Glucose, Bld: 83 mg/dL (ref 70–99)
Sodium: 138 mEq/L (ref 135–145)

## 2010-09-11 NOTE — Procedures (Unsigned)
EEG NUMBER:  HISTORY:  This is an 77 year old woman admitted due to syncopal episodes, EEG for evaluation.  MEDICATIONS:  Aspirin, vitamin B12, Prilosec.  EEG DURATION:  22.5 minutes of EEG recording.  EEG DESCRIPTION:  This is a routine 16 channel adult EEG recording with one channel devoted to limited EKG recording.  Activation procedure performed during the photic stimulation of the study is performed in awake state.  As the EEG opens up, I noticed that the posterior dominant rhythm is well formed, 9-10 Hz frequency with a low amplitude up to 18 microvolts at the most.  There is well-preserved anteroposterior gradient as well. This study is reflective of the awake state and no sleep architecture is recorded.  There are no electrographic seizures or epileptiform discharges recorded during this study.  Mild driving was noted with the photic stimulation in posterior leads.  EEG INTERPRETATION:  This is a normal awake EEG.  There is no evidence to suggest electrographic seizures or epileptiform discharges based on this study.          ______________________________ Levie Heritage, MD    ZO:[id number] D:  09/10/2010 17:08:17  T:  09/11/2010 00:28:35  Job #:  [id number]

## 2010-09-14 NOTE — Discharge Summary (Signed)
Ashley, [name]:  [id number]  MEDICAL RECORD NO.:  [id number]  LOCATION:  4738                         FACILITY:  [facility]  PHYSICIAN:  Isidor Holts, M.D.  DATE OF BIRTH:  [birth year]  DATE OF ADMISSION:  09/09/2010 DATE OF DISCHARGE:  09/11/2010                              DISCHARGE SUMMARY   PRIMARY CARE PHYSICIAN:  Willow Ora, MD  PRIMARY CARDIOLOGIST:  Veverly Fells. Excell Seltzer, MD, [facility]  DISCHARGE DIAGNOSES: 1. Syncope. 2. Frequent falls. 3. History of complete heart block, status post permanent pacer. 4. History of peripheral vascular disease. 5. Chronic anemia. 6. Mild dehydration. 7. Hypertension.  DISCHARGE MEDICATIONS: 1. Norvasc 5 mg p.o. daily. 2. Calcium OTC 1 tablet p.o. daily. 3. Prilosec OTC 1 tablet p.o. daily. 4. Vitamin B12 OTC 1 tablet p.o. daily. 5. Aspirin 81 mg p.o. daily. 6. Fish oil OTC 1 tablet p.o. daily.  PROCEDURES: 1. Head CT scan, September 09, 2010.  This showed no intracranial     hemorrhage or CT evidence of large acute infarct.  There were     prominent small vessel disease-type changes, cerebral atrophy     without hydrocephalus. 2. Chest x-ray, September 09, 2010.  This showed no acute abnormality.     Heart size was abnormal.  There was no infiltrate, congestive heart     failure, or pneumothorax.  There were shoulder joint degenerative     changes. 3. EEG, September 10, 2010.  This was a normal awake EEG.  There was no     evidence to suggest electrographic seizures or epileptiform     discharges based on this study.  CONSULTATION: 1. Doylene Canning. Ladona Ridgel, MD 2. Deidre Ala, MD 3. Electrophysiology pacemaker technologist, Hart Robinsons.  ADMISSION HISTORY:  As in H and P notes of September 09, 2010, dictated by Dr. Midge Minium.  However, in brief, this is an 77 year old female, with known history of hypertension, complete heart block status post permanent pacemaker, and chronic anemia, presenting  with a syncopal episode which occurred around 4:00 p.m. on day of presentation after having lunch and talking with the family.  She was noted to get completely unresponsive with eyes open, became "stiff" for almost 3-4 minutes without any involuntary movements, tongue biting, or incontinence of urine, following which she became completely normal.  The patient was brought to the emergency department and was admitted for further evaluation, investigation, and management.  CLINICAL COURSE: 1. Syncopal episode.  Etiology is unclear, but it may have been     vasovagal.  Be that as it may, the patient underwent workup     including head CT scan, EEG, and telemetric monitoring.  Cardiac     enzymes were cycled and remained unelevated.  12-lead EKG showed no     acute ischemic changes.  Pacemaker check was carried out by the     electrophysiology team and atrial lead configuration pacing was     changed from bipolar to unipolar with sensing and bipolar.  Atrial     amplitude/pulse width was changed to 4.0V at 1.0 ms.  Ventricular     amplitude was changed to automatic capture to save battery  life.     The pacemaker was interrogated, no pauses were recorded, and the     patient appeared to be dependent with underlying rhythm,     ventricular paced at 30 with 15% atrial paced and 100% ventricular     paced.  During the course of the patient's hospitalization, she     continued to show paced complexes.  2. Dehydration.  The patient on initial laboratory testing, was noted     to have a high BUN/creatinine ratio with a BUN of 30 and creatinine     1.17, consistent with mild dehydration.  She was managed with     intravenous fluids.  As of September 11, 2010, hydration status was     much improved with BUN of 22, creatinine 0.85.  Per family, the     patient does not drink lots of liquid.  They have been encouraged     to push oral fluid intake.  3. History of frequent falls.  The patient  fortunately has managed to     avoid any bony injuries.  4. Chronic anemia.  The patient's hemoglobin as of September 10, 2010 was     11.8 with a hematocrit of 34.5.  This was considered reasonable.  5. Hypertension.  The patient had no orthostasis on postural blood pressure     testing. However, over the course of her hospitalization her BP     continued to rise gradually reaching an all-time high of 176/82     mmHg on September 10, 2010 and 155/72 mmHg on September 11, 2010.  She     has been commenced on Norvasc at a dose of 5 mg p.o. daily,     accordingly.  DISPOSITION:  The patient was on September 11, 2010, asymptomatic with no new issues.  She was very keen to go home.  She was therefore discharged accordingly.  She has been evaluated by home health PT/OT and home health aide as well as 3-in-1 commode have been recommended. These have been arranged accordingly.  The patient was discharged on September 11, 2010.  ACTIVITY:  As tolerated.  Recommended to increase activity slowly.  DIET:  Heart-healthy.  FOLLOWUP INSTRUCTIONS:  The patient will continue to follow up routinely with her primary MD, Dr. Willow Ora per prior scheduled appointment and follow up with her primary cardiologist, Dr. Tonny Bollman per prior scheduled appointment.  SPECIAL INSTRUCTIONS:  Home health PT/OT aide and 3-in-1 commode have been arranged.     Isidor Holts, M.D.     CO/MEDQ  D:  09/11/2010  T:  09/11/2010  Job:  [id number]  cc:   Willow Ora, MD Veverly Fells. Excell Seltzer, MD  Electronically Signed by Isidor Holts M.D. on 09/14/2010 06:57:27 PM

## 2010-09-17 ENCOUNTER — Telehealth: Payer: Self-pay | Admitting: *Deleted

## 2010-09-17 NOTE — H&P (Signed)
[name], KRICHBAUM NO.:  [id number]  MEDICAL RECORD NO.:  [id number]  LOCATION:  [facility]                         FACILITY:  [facility]  PHYSICIAN:  Eduard Clos, MDDATE OF BIRTH:  12-07-1922  DATE OF ADMISSION:  09/09/2010 DATE OF DISCHARGE:                             HISTORY & PHYSICAL   PRIMARY CARE PHYSICIAN:  Willow Ora, MD  PRIMARY CARDIOLOGIST:  Veverly Fells. Excell Seltzer, MD of [place].  CHIEF COMPLAINT:  Loss of consciousness.  HISTORY OF PRESENT ILLNESS:  An 77 year old female with history of pacemaker placement for complete heart block, history of hypertension off medication last 3 months, and history of chronic anemia was doing fine around 4 p.m. after having a lunch was talking with her family when suddenly she was noticed to get completely unresponsive, open eyes, and became stiff for almost 3-4 minutes.  She did not have any convulsions, did not have any tongue bite or incontinence of urine.  After the incident, the patient completely became normal, although the patient does not recall the whole incident.  The patient has no headache.  No chest pain.  No shortness of breath.  No palpitations.  She did not have any focal deficit.  She did not have any nausea, vomiting, abdominal pain, dysuria, discharge, diarrhea.  No cough, phlegm, fever, chills.  In the ER at this time, the patient had EKG which is showing paced rhythm.  Labs are showing anemia, which the patient does have history of.  The patient has been admitted for further observation.  The patient states that over the last 3 months, the patient noted to be having frequent falls, usually falls backwards, and was referred to an ENT doctor and as per the ENT evaluation, there was nothing significant. The patient has been taken off the amlodipine almost for 3 months now.  PAST MEDICAL HISTORY: 1. Hypertension, off medications now. 2. History of complete heart block, status post pacemaker  placement. 3. History of chronic anemia.  MEDICATIONS ON ADMISSION:  The patient presently takes no medication.  SOCIAL HISTORY:  The patient smoked cigarettes when she was very young. Currently, does not smoke cigarette, drink alcohol, or use illegal drugs.  Lives alone, frequented by her family.  FAMILY HISTORY:  Mother had some kind of heart trouble, sister had hypertension, on of her sisters also has seizures.  ALLERGIES:  ANTIHISTAMINE, CODEINE, and PENICILLIN.  REVIEW OF SYSTEMS:  As per the history of present illness, nothing else significant.  PHYSICAL EXAMINATION:  GENERAL:  The patient examined at bedside, not in acute distress. VITAL SIGNS:  Blood pressure 147/60, pulse is 60 per minute, temperature 97.7, respirations 18 per minute, O2 sat 96%.  HEENT:  Anicteric.  No pallor.  No facial asymmetry.  Tongue is midline.  No neck rigidity. CHEST:  Bilateral air entry present.  No rhonchi.  No crepitation. HEART:  S1 and S2 heard. ABDOMEN:  Soft, nontender.  Bowel sounds heard. CNS:  The patient is alert, awake, oriented to time, place, and person. Moves upper and lower extremity 5/5. EXTREMITIES:  Peripheral pulses felt.  No acute ischemic changes, cyanosis, or clubbing.  LABORATORY DATA:  EKG shows paced rhythm, heart rate  is around 65 beats per minute.  Chest x-ray shows no acute abnormality.  CT head without contrast shows no intracranial hemorrhage.  CT evidence of large acute infarct, prominent small vessel disease type changes, global atrophy without hydrocephalus.  CBC:  WBC 7.3, hemoglobin 10.5, hematocrit 30.7, platelets 251.  Basic metabolic panel:  Sodium 138, potassium 4.9, chloride 105, carbon dioxide 27, glucose 98, BUN 32, creatinine 1.1, calcium 9.  UA is negative for nitrite, leukocytes trace, squamous cells few, hyaline cast, wbc 0, bacteria rare.  ASSESSMENT: 1. Syncope. 2. Mild dehydration. 3. History of pacemaker placement. 4. Complete heart  block. 5. History of hypertension off medication last 3 months. 6. Chronic anemia with history of having colonoscopy done in 2005. 7. History of peripheral arterial disease.  PLAN:  At this time, we will admit the patient to telemetry.  For syncope, at this time, we do not know the exact cause.  At this time, the patient's pacemaker is interrogated.  The patient did have features of being stiff.  At this time, we need to rule out seizures by doing an EEG.  The patient does have a pacemaker, so we cannot get an MRI.  We will hydrate.  We will recheck labs again in the morning.  The patient does have history of chronic anemia, we will check anemia panel, stool for occult blood.  At this time, I am going to keep the patient on p.r.n. hydralazine for any blood pressure more than 160 systolic and further recommendation based on test order and clinical course.     Eduard Clos, MD     ANK/MEDQ  D:  09/09/2010  T:  09/10/2010  Job:  [id number]  cc:   Willow Ora, MD Veverly Fells. Excell Seltzer, MD  Electronically Signed by Midge Minium MD on 09/17/2010 09:35:04 AM

## 2010-09-17 NOTE — Telephone Encounter (Signed)
Caller states that hospital put patient back on Amlodipine & Pt is having dizziness again; also HBP 150/80 at hospital. [facility] w/patient and informed to stop taking Amlodipine, monitor BP, and scheduled F/U OV on Fri. 09/20/10 @ 3:30pm

## 2010-09-19 ENCOUNTER — Telehealth: Payer: Self-pay

## 2010-09-19 NOTE — Telephone Encounter (Signed)
Please do

## 2010-09-19 NOTE — Telephone Encounter (Signed)
Verbal order needed for 4 wheel walker for [facility] (Physical Therapy)  [phone] Ext [phone]

## 2010-09-20 ENCOUNTER — Ambulatory Visit (INDEPENDENT_AMBULATORY_CARE_PROVIDER_SITE_OTHER): Payer: Medicare Other | Admitting: Internal Medicine

## 2010-09-20 ENCOUNTER — Encounter: Payer: Self-pay | Admitting: Internal Medicine

## 2010-09-20 DIAGNOSIS — K5900 Constipation, unspecified: Secondary | ICD-10-CM

## 2010-09-20 DIAGNOSIS — I10 Essential (primary) hypertension: Secondary | ICD-10-CM

## 2010-09-20 DIAGNOSIS — D649 Anemia, unspecified: Secondary | ICD-10-CM

## 2010-09-20 DIAGNOSIS — R634 Abnormal weight loss: Secondary | ICD-10-CM

## 2010-09-20 NOTE — Patient Instructions (Signed)
miralax every other day as needed for constipation Stop motrin, tylenol for pain

## 2010-09-20 NOTE — Assessment & Plan Note (Signed)
BP found to be slt elevated [facility] the [facility], was Rx amlodipine, self d/c d/t s/e  BP today ok No change

## 2010-09-20 NOTE — Assessment & Plan Note (Addendum)
See previous entry , was Rx miralax but apparently daily intake is too much:  Decrease to qod

## 2010-09-20 NOTE — Progress Notes (Signed)
  Subjective:    Patient ID: [name], female    DOB: 1922-06-08, 77 y.o.   MRN: [id number]  HPI Was admitted to the hospital for 2 days, discharge 09-11-10 Admitted with syncope, workup included a CT of the head, EEG and telemetry. Cardiac enzymes are EKG were within normal. Pacemaker was checked by cardiology. She had no orthostatis, her BP was noted to be elevated and she was started on Norvasc. She was to receive home health PT/OT and a home health aid. Labs were reviewed: iron was low at 22, hemoglobin 11.8, last BMP normal, TSH normal  Past Medical History  Diagnosis Date  . Hypertension   . Hyperlipidemia   . Cardiomyopathy   . Pacemaker     [place] [employer] [id number]  . PVD (peripheral vascular disease) 2007    stenting b legs 2007,   . Allergic rhinitis   . GERD (gastroesophageal reflux disease)   . Osteoporosis   . Osteoarthritis   . Gallstones     per [place] 11-2008, also no AAA  . CHF (congestive heart failure)   . Pacemaker    Past Surgical History  Procedure Date  . Pacemaker insertion     permanent   . Breast biopsy benign      Review of Systems Since she left the hospital she is doing well, she is back home, she is getting PT/OT. She took amlodipine for approximately 4 days, she self discontinued it because she was getting dizzy. Denies any further syncope No chest pain or shortness of breath No nausea, vomiting, diarrhea or blood in the stools. She admits to a mild epigastric discomfort , ill defined. She is taking MiraLax every day for constipation and thinks that was " too much" but  can't  be more specific, slightly loose stools?.    Objective:   Physical Exam  Constitutional: She is oriented to person, place, and time. She appears well-developed. No distress.  HENT:  Head: Normocephalic and atraumatic.  Cardiovascular: Normal rate, regular rhythm and normal heart sounds.   No murmur heard. Pulmonary/Chest: Effort normal and breath sounds  normal. No respiratory distress. She has no wheezes. She has no rales.  Abdominal: Soft. She exhibits no distension. There is no tenderness. There is no rebound.  Musculoskeletal: She exhibits no edema.  Neurological: She is alert and oriented to person, place, and time.  Skin: She is not diaphoretic.          Assessment & Plan:  SYNCOPE: Resolved, thought to be vasovagal

## 2010-09-20 NOTE — Assessment & Plan Note (Signed)
Ongoing weight loss for the last year. In July 2011, weight 146 today is 1129. Weight loss has been gradual and she feels well. Recent CT chest showed no malignancy Plan-- continue observatipon  (See anemia)

## 2010-09-20 NOTE — Assessment & Plan Note (Addendum)
H/o mild anemia , iron level  last year wnl but found to be low while at the hospital. chart reviewed, per GI:" Two colonoscopies 2002 and 2005 without neoplasia, she is asymptomatic and 72 so will not conduct further routine colonoscopy exams" Plan: Discontinue motrin  labs on RTC

## 2010-09-23 NOTE — Telephone Encounter (Signed)
Left message on voicemail with verbal order 

## 2010-09-25 ENCOUNTER — Telehealth: Payer: Self-pay | Admitting: *Deleted

## 2010-09-25 NOTE — Telephone Encounter (Signed)
LMOM that Dr Drue Novel agrees w/continued [facility] and PT, and can she accept verbal Order via telephone.? Left contact name & number for any further needed paperwork, etc.

## 2010-09-25 NOTE — Telephone Encounter (Signed)
Caller requesting Continued Home Health Care/Physical Therapy Orders.

## 2010-09-25 NOTE — Telephone Encounter (Signed)
agree

## 2010-09-26 ENCOUNTER — Ambulatory Visit (INDEPENDENT_AMBULATORY_CARE_PROVIDER_SITE_OTHER): Payer: Medicare Other | Admitting: Cardiovascular Disease

## 2010-09-26 ENCOUNTER — Encounter: Payer: Self-pay | Admitting: Cardiovascular Disease

## 2010-09-26 DIAGNOSIS — I442 Atrioventricular block, complete: Secondary | ICD-10-CM

## 2010-09-26 DIAGNOSIS — I739 Peripheral vascular disease, unspecified: Secondary | ICD-10-CM

## 2010-09-26 NOTE — Patient Instructions (Signed)
Your physician wants you to follow-up in: 12 months with Dr. Taylor. You will receive a reminder letter in the mail two months in advance. If you don't receive a letter, please call our office to schedule the follow-up appointment.    

## 2010-09-30 ENCOUNTER — Emergency Department (HOSPITAL_COMMUNITY): Payer: Medicare Other

## 2010-09-30 ENCOUNTER — Inpatient Hospital Stay (HOSPITAL_COMMUNITY)
Admission: EM | Admit: 2010-09-30 | Discharge: 2010-10-03 | DRG: 644 | Disposition: A | Discharge status: Skilled Nursing Facility | Payer: Medicare Other | Attending: Internal Medicine | Admitting: Internal Medicine

## 2010-09-30 ENCOUNTER — Encounter (HOSPITAL_COMMUNITY): Payer: Self-pay | Admitting: Radiology

## 2010-09-30 ENCOUNTER — Telehealth: Payer: Self-pay

## 2010-09-30 DIAGNOSIS — Z950 Presence of cardiac pacemaker: Secondary | ICD-10-CM

## 2010-09-30 DIAGNOSIS — K5900 Constipation, unspecified: Secondary | ICD-10-CM | POA: Diagnosis present

## 2010-09-30 DIAGNOSIS — Z7982 Long term (current) use of aspirin: Secondary | ICD-10-CM

## 2010-09-30 DIAGNOSIS — Z9181 History of falling: Secondary | ICD-10-CM

## 2010-09-30 DIAGNOSIS — W19XXXA Unspecified fall, initial encounter: Secondary | ICD-10-CM | POA: Diagnosis present

## 2010-09-30 DIAGNOSIS — M6282 Rhabdomyolysis: Secondary | ICD-10-CM | POA: Diagnosis present

## 2010-09-30 DIAGNOSIS — I10 Essential (primary) hypertension: Secondary | ICD-10-CM | POA: Diagnosis present

## 2010-09-30 DIAGNOSIS — D638 Anemia in other chronic diseases classified elsewhere: Secondary | ICD-10-CM | POA: Diagnosis present

## 2010-09-30 DIAGNOSIS — E236 Other disorders of pituitary gland: Principal | ICD-10-CM | POA: Diagnosis present

## 2010-09-30 DIAGNOSIS — R269 Unspecified abnormalities of gait and mobility: Secondary | ICD-10-CM | POA: Diagnosis present

## 2010-09-30 DIAGNOSIS — E860 Dehydration: Secondary | ICD-10-CM | POA: Diagnosis present

## 2010-09-30 LAB — DIFFERENTIAL
Basophils Relative: 1 % (ref 0–1)
Monocytes Absolute: 0.5 10*3/uL (ref 0.1–1.0)
Monocytes Relative: 8 % (ref 3–12)
Neutro Abs: 4.7 10*3/uL (ref 1.7–7.7)

## 2010-09-30 LAB — CBC
HCT: 33.6 % — ABNORMAL LOW (ref 36.0–46.0)
Hemoglobin: 12.1 g/dL (ref 12.0–15.0)
MCH: 29.3 pg (ref 26.0–34.0)
MCHC: 36 g/dL (ref 30.0–36.0)

## 2010-09-30 LAB — BASIC METABOLIC PANEL
BUN: 14 mg/dL (ref 6–23)
Chloride: 89 mEq/L — ABNORMAL LOW (ref 96–112)
Creatinine, Ser: 0.64 mg/dL (ref 0.50–1.10)
Glucose, Bld: 84 mg/dL (ref 70–99)
Potassium: 4.3 mEq/L (ref 3.5–5.1)

## 2010-09-30 NOTE — Telephone Encounter (Signed)
The PT from [facility] states the pt does not have any children and on two other occasions when she was visiting the pt she had fallen asleep and the let water overflow onto the floor and the pt had biscuits burning.  Per [facility] pt is oriented and alert and vitals are stable.   Per Dr. Drue Novel pt needs to go in through the ED.

## 2010-09-30 NOTE — Telephone Encounter (Signed)
Incoming call from [facility] stating pt was to be seen today.  When [facility] arrived and knocked on the door pt yelled to let the therapist know that she had fallen and could not get to the door.

## 2010-09-30 NOTE — Telephone Encounter (Signed)
W/ the type of incident they are describing, she probably needs placement.

## 2010-10-01 LAB — BASIC METABOLIC PANEL
BUN: 20 mg/dL (ref 6–23)
CO2: 24 mEq/L (ref 19–32)
Calcium: 9.1 mg/dL (ref 8.4–10.5)
Creatinine, Ser: 0.84 mg/dL (ref 0.50–1.10)
Glucose, Bld: 64 mg/dL — ABNORMAL LOW (ref 70–99)

## 2010-10-01 LAB — SODIUM, URINE, RANDOM: Sodium, Ur: 51 mEq/L

## 2010-10-01 LAB — CBC
Hemoglobin: 10.9 g/dL — ABNORMAL LOW (ref 12.0–15.0)
MCH: 29.5 pg (ref 26.0–34.0)
MCHC: 36.5 g/dL — ABNORMAL HIGH (ref 30.0–36.0)
MCV: 81 fL (ref 78.0–100.0)
RBC: 3.69 MIL/uL — ABNORMAL LOW (ref 3.87–5.11)

## 2010-10-02 LAB — BASIC METABOLIC PANEL
Calcium: 8.6 mg/dL (ref 8.4–10.5)
GFR calc Af Amer: >60 mL/min (ref >60)
GFR calc non Af Amer: >60 mL/min (ref >60)
Glucose, Bld: 75 mg/dL (ref 70–99)
Sodium: 126 mEq/L — ABNORMAL LOW (ref 135–145)

## 2010-10-02 LAB — CARDIAC PANEL(CRET KIN+CKTOT+MB+TROPI)
CK, MB: 4.2 ng/mL — ABNORMAL HIGH (ref 0.3–4.0)
Relative Index: 1.4 (ref 0.0–2.5)
Total CK: 308 U/L — ABNORMAL HIGH (ref 7–177)
Troponin I: <0.3 ng/mL (ref <0.30)

## 2010-10-04 NOTE — Discharge Summary (Signed)
[name], [name]:  [id number]  MEDICAL RECORD NO.:  [id number]  LOCATION:  5007                         FACILITY:  [facility]  PHYSICIAN:  Lonia Blood, M.D.       DATE OF BIRTH:  08/31/1922  DATE OF ADMISSION:  09/30/2010 DATE OF DISCHARGE:  10/03/2010                              DISCHARGE SUMMARY   PRIMARY CARE PHYSICIAN:  Willow Ora, MD  DISCHARGE DIAGNOSES: 1. Frequent falls. 2. Gait imbalance. 3. Hypertension - intolerant antihypertensives due to dizziness and     falls. 4. History of complete heart block, status post permanent pacemaker. 5. History of chronic anemia. 6. 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  DISCHARGE MEDICATIONS: 1. Aspirin 81 mg daily. 2. Calcium with vitamin D 1 tablet daily. 3. MiraLax 17 g every other day. 4. Vitamin B12 one tablet daily.  CONDITION ON DISCHARGE:  The patient is discharged in good condition. She will go for short-term rehabilitation to improve strength and balance.  The patient is to have fluid restriction 1200 mL per 24 hours.  PROCEDURE DURING THIS ADMISSION: 1. The patient underwent CT scan of the head and cervical spine on     September 30, 2010, showing age-related atrophy, no acute     intracranial findings, degenerative cervical spondylosis, disk     disease, facet disease. 2. Left hip x-ray negative for fracture. 3. Lumbar spine x-ray showing degenerative disease and facet disease,     no fractures. 4. Pelvis x-rays on September 30, 2010, showing no acute bony     findings.  CONSULTATION:  The patient was seen in consultation with Physical Therapy and Occupational Therapy.  HISTORY AND PHYSICAL:  Refer to dictated H and P done by Dr. Kaylyn Layer.  HOSPITAL COURSE:  [name] is an 77 year old woman without any significant past medical problems, who was admitted after she sustained a fall at home and was unable to get up for a few hours.   She had some mild rhabdomyolysis on admission.  Her total CK level on admission was elevated to 810.  She also was noted to have a hyponatremia of 124. Despite the fact that she was hyponatremia, she did not seem to be symptomatic.  She was not confused.  She seemed to have some gait abnormality and difficulty with balance.  Head CT did not reveal any stroke.  Neurological exam did not reveal any focal deficits.  The x- rays of the hip and pelvis did not reveal any fractures.  The patient was evaluated by physical therapist and felt that she could be a good candidate for short-term rehabilitation to improve on her gait balance. The another reason to refer her short-term rehabilitation is also due to her living alone and home health agency raising some questions about safety at home.  They mentioned that the patient was forgetting pots on the stove.  Brief screening for dementia was negative.  [name] seems to be doing fairly well cognitively.  She will obviously need the full mini-mental status exam once she follows up in the office.  Otherwise, today, October 03, 2010, the patient is deemed stable  for discharge to the nursing home.     Lonia Blood, M.D.     SL/MEDQ  D:  10/03/2010  T:  10/03/2010  Job:  [id number]  cc:   Willow Ora, MD  Electronically Signed by Lonia Blood M.D. on 10/04/2010 03:25:22 PM

## 2010-10-09 ENCOUNTER — Encounter: Payer: Self-pay | Admitting: Cardiovascular Disease

## 2010-10-09 NOTE — Assessment & Plan Note (Signed)
The patient is stable. She has a permanent pacemaker and is followed by Dr. Ladona Ridgel.

## 2010-10-09 NOTE — Progress Notes (Signed)
HPI:  This is an 77 year old woman presented for hospital followup evaluation. The patient was hospitalized from September 17 through September 20 for treatment of a fall. The patient's recollection of events is not very good but it does not sound like she had frank syncope. She fell in the middle of the night while walking to the kitchen. She was using her walker at the time. Her workup included a CT scan of the monitored on telemetry. The patient has had a permanent pacemaker placed and this is functioning normally. Her brain CT demonstrated age-related cerebral atrophy, ventriculomegaly, and periventricular white matter disease. There were no acute intracranial findings. She has been taken off of all antihypertensive medications do to poor tolerance of this with recurrent dizziness and falls. She denies chest pain or dyspnea.  Outpatient Encounter Prescriptions as of 09/26/2010  Medication Sig Dispense Refill  . acetaminophen (TYLENOL) 325 MG tablet Take 650 mg by mouth every 6 (six) hours as needed.        Marland Kitchen aspirin 81 MG tablet Take 81 mg by mouth daily.        . Calcium Carbonate-Vitamin D (CALCIUM + D PO) Take by mouth.        Marland Kitchen KRILL OIL 1000 MG CAPS Take 1 capsule by mouth daily.        . polyethylene glycol (MIRALAX / GLYCOLAX) packet Take 17 g by mouth every other day as needed.        . vitamin B-12 (CYANOCOBALAMIN) 1000 MCG tablet Take 1,000 mcg by mouth daily.        Marland Kitchen DISCONTD: acetaminophen (TYLENOL) 325 MG tablet Take 650 mg by mouth every 6 (six) hours as needed.          Allergies  Allergen Reactions  . Amlodipine Other (See Comments)    Dizziness & elevated BP  . Hydrocodone     REACTION: itching  . Penicillins     REACTION: unspecified    Past Medical History  Diagnosis Date  . Hypertension   . Hyperlipidemia   . Cardiomyopathy   . Pacemaker     [employer] [id number]  . PVD (peripheral vascular disease) 2007    stenting b legs 2007,   . Allergic rhinitis     . GERD (gastroesophageal reflux disease)   . Osteoporosis   . Osteoarthritis   . Gallstones     per [place] 11-2008, also no AAA  . CHF (congestive heart failure)   . Pacemaker     ROS: Negative except as per HPI  BP 136/68  Pulse 65  Ht 5\' 4"  (1.626 m)  Wt 127 lb (57.607 kg)  BMI 21.80 kg/m2  PHYSICAL EXAM: Pt is alert and oriented, elderly woman in NAD HEENT: normal Neck: JVP - normal, carotids 2+= without bruits Lungs: CTA bilaterally CV: RRR without murmur or gallop Abd: soft, NT, Positive BS, no hepatomegaly Ext: no C/C/E Skin: warm/dry no rash  ASSESSMENT AND PLAN:

## 2010-10-09 NOTE — Assessment & Plan Note (Signed)
She is status post bilateral SFA stenting and left popliteal angioplasty. She has no signs of critical limb ischemia and really has no claudication.  She has become inactive over the last few years. She continues on daily baby aspirin.

## 2010-10-10 LAB — COMPREHENSIVE METABOLIC PANEL
AST: 30
Albumin: 3.9
Calcium: 9.5
Chloride: 108
Creatinine, Ser: 0.82
GFR calc Af Amer: >60
Total Bilirubin: 0.7

## 2010-10-10 LAB — DIFFERENTIAL
Eosinophils Relative: 2
Lymphocytes Relative: 25
Lymphs Abs: 2.4
Monocytes Absolute: 0.6

## 2010-10-10 LAB — URINALYSIS, ROUTINE W REFLEX MICROSCOPIC
Bilirubin Urine: NEGATIVE
Glucose, UA: NEGATIVE
Hgb urine dipstick: NEGATIVE
Ketones, ur: NEGATIVE
pH: 7

## 2010-10-10 LAB — URINE MICROSCOPIC-ADD ON

## 2010-10-10 LAB — CBC
MCV: 89.8
Platelets: 248
WBC: 9.9

## 2010-10-15 ENCOUNTER — Emergency Department (HOSPITAL_COMMUNITY): Payer: Medicare Other

## 2010-10-15 ENCOUNTER — Emergency Department (HOSPITAL_COMMUNITY)
Admission: EM | Admit: 2010-10-15 | Discharge: 2010-10-15 | Disposition: A | Discharge status: Home / Self Care | Payer: Medicare Other | Attending: Emergency Medicine | Admitting: Emergency Medicine

## 2010-10-15 DIAGNOSIS — W050XXA Fall from non-moving wheelchair, initial encounter: Secondary | ICD-10-CM | POA: Insufficient documentation

## 2010-10-15 DIAGNOSIS — I509 Heart failure, unspecified: Secondary | ICD-10-CM | POA: Insufficient documentation

## 2010-10-15 DIAGNOSIS — Z043 Encounter for examination and observation following other accident: Secondary | ICD-10-CM | POA: Insufficient documentation

## 2010-10-15 DIAGNOSIS — Y9210 Unspecified residential institution as the place of occurrence of the external cause: Secondary | ICD-10-CM | POA: Insufficient documentation

## 2010-10-15 DIAGNOSIS — K219 Gastro-esophageal reflux disease without esophagitis: Secondary | ICD-10-CM | POA: Insufficient documentation

## 2010-10-15 DIAGNOSIS — I10 Essential (primary) hypertension: Secondary | ICD-10-CM | POA: Insufficient documentation

## 2010-10-15 DIAGNOSIS — F0390 Unspecified dementia without behavioral disturbance: Secondary | ICD-10-CM | POA: Insufficient documentation

## 2010-10-15 LAB — DIFFERENTIAL
Lymphocytes Relative: 23 % (ref 12–46)
Lymphs Abs: 2.1 10*3/uL (ref 0.7–4.0)
Neutrophils Relative %: 66 % (ref 43–77)

## 2010-10-15 LAB — CBC
HCT: 33.9 % — ABNORMAL LOW (ref 36.0–46.0)
MCV: 85 fL (ref 78.0–100.0)
Platelets: 336 10*3/uL (ref 150–400)
RBC: 3.99 MIL/uL (ref 3.87–5.11)
WBC: 9.1 10*3/uL (ref 4.0–10.5)

## 2010-10-15 LAB — HEPATIC FUNCTION PANEL
Alkaline Phosphatase: 68 U/L (ref 39–117)
Indirect Bilirubin: 0.5 mg/dL (ref 0.3–0.9)
Total Protein: 7.7 g/dL (ref 6.0–8.3)

## 2010-10-15 LAB — BASIC METABOLIC PANEL
CO2: 23 mEq/L (ref 19–32)
Chloride: 103 mEq/L (ref 96–112)
Creatinine, Ser: 0.8 mg/dL (ref 0.50–1.10)
Potassium: 4.7 mEq/L (ref 3.5–5.1)

## 2010-10-16 NOTE — H&P (Signed)
[name], [name]:  [id number]  MEDICAL RECORD NO.:  [id number]  LOCATION:  [facility]                         FACILITY:  [facility]  PHYSICIAN:  Carlota Raspberry, MD         DATE OF BIRTH:  March 03, 1922  DATE OF ADMISSION:  09/30/2010 DATE OF DISCHARGE:                             HISTORY & PHYSICAL   PRIMARY CARE PHYSICIAN:  Willow Ora, MD  PRIMARY CARDIOLOGIST:  Veverly Fells. Excell Seltzer, MD With [facility].  CHIEF COMPLAINT:  Status post fall.  HISTORY OF PRESENT ILLNESS:  This is an 77 year old female with a history of complete heart block status post PTM, hypertension, off medications, who presents with a mechanical fall.  The patient is a somewhat difficult historian, but is able to relate that she woke up at about 2 a.m. this morning and was going to the kitchen using her walker and somewhere during this course, was getting back into bed, but has a bed that is high up on a mattress.  She was trying to step up to it, but fell backwards.  She denies any frank loss of consciousness and does seem to remember the event, but the details are a little bit hazy.  She is able to draw any prodrome or any other associated symptoms.  No chest pain, shortness of breath, headaches, dizziness, or any other promontory symptoms.  The patient has a Home Health aide who found her around noon time and she had been down for about 10 hours, unable to get herself up due to weakness.  She was recommended to come to the emergency room where her initial vital signs were 158/68, pulse 67, respiratory rate 16, and temperature 97.4.  Her EKG here shows V-pacing.  Her workup through her stay in the emergency room has revealed a CBC that is fairly unrevealing including a hematocrit of 33.6 at baseline.  She was also hyponatremic, hypochloremic with a CK of 810.  Her imaging has been unrevealing in the emergency room including a CT C-spine, a CT head, and plain films of her L-spine, pelvis,  and left hip.  Therefore, she is being admitted for hyponatremia and also for assistance with potential placement as the Home Health aide and her family are feeling that she has not been doing well at home and are worried about sending her back home.  At present, the patient is complaining of some left hip pain and back pain from the fall, but is otherwise doing well.  She does appear to have some ecchymoses that are new from the fall as well too.  However, she denies any fevers, chills, night sweats, headache, chest pain, shortness of breath, dysuria.  She does endorse chronic arthritis and chronic lower back pain related to her age and a chronic cough that is productive of some whitish sputum.  She has had some falls more recently, but none for at least the past few weeks.  She has been trying to drink more water because she was told to stay well hydrated during her last admission.  Of note, the patient was also admitted here at the end of August 2012, for a syncopal episode and had a  negative workup that admission including CT head, EEG, telemetry, cardiac enzymes, EKG, pacer interrogation.  Fluids were given for mild dehydration.  Amlodipine which had been previously had been resumed for higher blood pressures to admission after orthostatics were seemed to be negative.  However, this was stopped again per the patient by Dr. Drue Novel for dizziness.  PAST MEDICAL HISTORY: 1. Hypertension, currently off medications due to dizziness. 2. History of complete heart block status post pacemaker placement. 3. History of chronic anemia.  Allergies listed are to ANTIHISTAMINES, PENICILLINS, and CODEINE.  MEDICATION LIST:  She states she is only taking multivitamins with calcium and krill oil and nothing else.  SOCIAL HISTORY:  She is still living at home by herself and gets around with a walker and a cane.  She is able to do some activities of daily living at her home and has a Home Health  aide who checks on her frequently.  She also has lots of nieces, cousins, and other family members who check up on her.  She smoked cigarettes when she was very young, but does not currently smoke, drink alcohol, or do any drugs.  FAMILY HISTORY:  Her mother had some heart troubles.  Her sister of hypertension and one of her sisters also has seizures.  PHYSICAL EXAMINATION:  VITAL SIGNS:  Blood pressure 112/57, last blood pressure in the ED was 195/97; pulse 77, 99% on room air, respiratory rate 18. GENERAL:  She is a thin, elderly lady who is in no distress.  She is pleasant, conversant, and appears well. HEENT:  Her pupils are bilaterally about 2-3 mm with minimal constriction.  Her extraocular muscles are intact.  Her sclerae are clear.  Her mouth is moist and normal appearing.  There are no gross oropharyngeal lesions noted. NECK:  Supple with no cervical lymphadenopathy noted. LUNGS:  Clear to auscultation bilaterally with no wheezes, crackles, rales, or adventitious lung sounds. HEART:  Regular rate and rhythm with a whooshing early-to-mid-peaking systolic murmur heard best between the left lower sternal border and the apex, but not heard otherwise.  She has a pacemaker underneath her skin on the left side of her chest. ABDOMEN:  Soft, nontender, nondistended, and completely benign. EXTREMITIES:  Warm, well perfused without any coolness or cyanosis.  She has decent muscle tone in her arms and legs.  There is no bilateral lower extremity edema.  Her bilateral radial pulses are minimally palpable.  She is spontaneously moving her upper extremities through conversation. NEUROLOGIC:  Cranial nerves II through XII are completely intact.  There are no gross focal neurological deficits and she has 5/5 strength in her proximal and distal muscle groups of her upper extremities.  Regarding her lower extremities, she has 4/5 hip flexion and 4-5/5 distal flexion and extension at the knee.   Sensation is intact throughout.  She is unable to perform heel-shin-heel testing due to weakness of her lower extremities, but her finger-nose-finger testing is unrevealing. Overall, her neuro exam is unremarkable. SKIN:  She has some new ecchymoses on her left hip and left buttock.  LABORATORY WORK:  White blood cell count is 6.4; hematocrit is 33.6, this appears within baseline; platelet count is 270.  Chemistry panel is significant for sodium 124, chloride 89, BUN and creatinine of 14 and 0.6; otherwise normal.  Her CK was 810.  CT of her head shows age- related atrophy, ventriculomegaly, and paraventricular white matter disease, but no acute intracranial findings.  CT of her C-spine shows advanced degenerative spondylosis  in her C-spine with disk disease and facet disease, but no acute processes.  Hip plain film shows no bony findings.  L-spine plain film shows diffuse DJD and pelvic plain film shows no acute bony findings.  EKG today shows underlying sinus rhythm with a very long PR interval, V- pacing, otherwise unchanged and otherwise un-interpretable.  IMPRESSION:  This is an 77 year old lady with a history of complete heart block status post pacemaker placement, hypertension, and currently off any medications who presents with a mechanical fall. 1. Status post fall.  This sounds like a mechanical fall in nature as     she denies any other symptoms and her workup today that has been     unrevealing.  She is admitted mainly for placement/social issues as     she has been following a lot and was admitted here just a few weeks     ago and her family feels she may not be thriving so well at home     and may need to go in a more structured living situation.     Therefore, we will get a PT and an OT consult and also ask Social     Work to consult for the question of placement. 2. Hyponatremia with hypochloremia.  Suspect this is due to poor oral     intake of solid versus the fact  that she states she is drinking a     lot of water to try and stay hydrated.  For now, we will just give     her some maintenance IV fluids and follow her BMETs on a daily     basis. 3. Hypertension.  The patient is currently off all medications and is     running hypertensive through the emergency room.  Medicines were     reportedly stopped by her PCP for history of dizziness at her age.     This seems reasonable and for now, we will just monitor and allow     blood pressures less than 180.  Should she be persistently above     180, we will restart her low-dose amlodipine, although again, it     seems that her PCP intentionally took her off this. 4. History of chronic anemia.  Her hematocrit is currently at     baseline. 5. Fluid, electrolytes, and nutrition.  She can get a regular diet.     We will give her some maintenance IV fluids for 1-2 L. 6. Prophylaxis.  Subcutaneous heparin for DVT prophylaxis, Colace and     senna and Tylenol for pain. 7. Code status.  I broached this with the patient and entered into a     long drawn-out conversation that did not really have any     conclusion.  Overall, though, I was able to get the sense that she     wanted to be DNR/DNI, although this was not 100% clear.  I did     encourage them that at her age, she needs to start having these     discussions with her family members.The patient will be admitted to [facility].          ______________________________ Carlota Raspberry, MD     EB/MEDQ  D:  09/30/2010  T:  09/30/2010  Job:  [id number]  Electronically Signed by Carlota Raspberry MD on 10/16/2010 12:17:17 PM

## 2010-10-24 ENCOUNTER — Emergency Department (HOSPITAL_COMMUNITY): Payer: Medicare Other

## 2010-10-24 ENCOUNTER — Inpatient Hospital Stay (HOSPITAL_COMMUNITY)
Admission: EM | Admit: 2010-10-24 | Discharge: 2010-10-29 | DRG: 189 | Disposition: A | Discharge status: Skilled Nursing Facility | Payer: Medicare Other | Attending: Internal Medicine | Admitting: Internal Medicine

## 2010-10-24 ENCOUNTER — Encounter (HOSPITAL_COMMUNITY): Payer: Self-pay

## 2010-10-24 DIAGNOSIS — Z950 Presence of cardiac pacemaker: Secondary | ICD-10-CM

## 2010-10-24 DIAGNOSIS — D6489 Other specified anemias: Secondary | ICD-10-CM | POA: Diagnosis present

## 2010-10-24 DIAGNOSIS — N179 Acute kidney failure, unspecified: Secondary | ICD-10-CM | POA: Diagnosis present

## 2010-10-24 DIAGNOSIS — B370 Candidal stomatitis: Secondary | ICD-10-CM | POA: Diagnosis not present

## 2010-10-24 DIAGNOSIS — R1310 Dysphagia, unspecified: Secondary | ICD-10-CM | POA: Diagnosis not present

## 2010-10-24 DIAGNOSIS — J9600 Acute respiratory failure, unspecified whether with hypoxia or hypercapnia: Principal | ICD-10-CM | POA: Diagnosis present

## 2010-10-24 DIAGNOSIS — E860 Dehydration: Secondary | ICD-10-CM | POA: Diagnosis present

## 2010-10-24 DIAGNOSIS — E162 Hypoglycemia, unspecified: Secondary | ICD-10-CM | POA: Diagnosis present

## 2010-10-24 DIAGNOSIS — Z9181 History of falling: Secondary | ICD-10-CM

## 2010-10-24 DIAGNOSIS — R197 Diarrhea, unspecified: Secondary | ICD-10-CM | POA: Diagnosis present

## 2010-10-24 DIAGNOSIS — I10 Essential (primary) hypertension: Secondary | ICD-10-CM | POA: Diagnosis present

## 2010-10-24 DIAGNOSIS — G9341 Metabolic encephalopathy: Secondary | ICD-10-CM | POA: Diagnosis present

## 2010-10-24 DIAGNOSIS — R0902 Hypoxemia: Secondary | ICD-10-CM | POA: Diagnosis present

## 2010-10-24 DIAGNOSIS — I959 Hypotension, unspecified: Secondary | ICD-10-CM | POA: Diagnosis present

## 2010-10-24 DIAGNOSIS — S0003XA Contusion of scalp, initial encounter: Secondary | ICD-10-CM | POA: Diagnosis present

## 2010-10-24 DIAGNOSIS — M6282 Rhabdomyolysis: Secondary | ICD-10-CM | POA: Diagnosis present

## 2010-10-24 DIAGNOSIS — E876 Hypokalemia: Secondary | ICD-10-CM | POA: Diagnosis present

## 2010-10-24 DIAGNOSIS — W19XXXA Unspecified fall, initial encounter: Secondary | ICD-10-CM | POA: Diagnosis present

## 2010-10-24 DIAGNOSIS — F068 Other specified mental disorders due to known physiological condition: Secondary | ICD-10-CM | POA: Diagnosis present

## 2010-10-24 DIAGNOSIS — S1093XA Contusion of unspecified part of neck, initial encounter: Secondary | ICD-10-CM | POA: Diagnosis present

## 2010-10-24 LAB — BASIC METABOLIC PANEL
Calcium: 9.4 mg/dL (ref 8.4–10.5)
Chloride: 109 mEq/L (ref 96–112)
Creatinine, Ser: 1.75 mg/dL — ABNORMAL HIGH (ref 0.50–1.10)
GFR calc Af Amer: 29 mL/min — ABNORMAL LOW (ref >90)
GFR calc non Af Amer: 25 mL/min — ABNORMAL LOW (ref >90)

## 2010-10-24 LAB — DIFFERENTIAL
Basophils Absolute: 0 10*3/uL (ref 0.0–0.1)
Eosinophils Absolute: 0.3 10*3/uL (ref 0.0–0.7)
Lymphocytes Relative: 24 % (ref 12–46)
Lymphs Abs: 1.8 10*3/uL (ref 0.7–4.0)
Neutrophils Relative %: 68 % (ref 43–77)

## 2010-10-24 LAB — URINALYSIS, ROUTINE W REFLEX MICROSCOPIC
Leukocytes, UA: NEGATIVE
Nitrite: NEGATIVE
Protein, ur: 30 mg/dL — AB
Urobilinogen, UA: 0.2 mg/dL (ref 0.0–1.0)

## 2010-10-24 LAB — CARDIAC PANEL(CRET KIN+CKTOT+MB+TROPI)
CK, MB: 5.4 ng/mL — ABNORMAL HIGH (ref 0.3–4.0)
Relative Index: 1.6 (ref 0.0–2.5)
Total CK: 409 U/L — ABNORMAL HIGH (ref 7–177)
Troponin I: <0.3 ng/mL (ref <0.30)

## 2010-10-24 LAB — MRSA PCR SCREENING: MRSA by PCR: POSITIVE — AB

## 2010-10-24 LAB — BLOOD GAS, ARTERIAL
Bicarbonate: 19.6 mEq/L — ABNORMAL LOW (ref 20.0–24.0)
Drawn by: 313061
O2 Content: 3 L/min
pH, Arterial: 7.383 (ref 7.350–7.400)
pO2, Arterial: 163 mmHg — ABNORMAL HIGH (ref 80.0–100.0)

## 2010-10-24 LAB — GLUCOSE, CAPILLARY: Glucose-Capillary: 87 mg/dL (ref 70–99)

## 2010-10-24 LAB — POCT I-STAT TROPONIN I: Troponin i, poc: 0.1 ng/mL (ref 0.00–0.08)

## 2010-10-24 LAB — PHOSPHORUS: Phosphorus: 3.3 mg/dL (ref 2.3–4.6)

## 2010-10-24 LAB — URINE MICROSCOPIC-ADD ON

## 2010-10-24 LAB — MAGNESIUM: Magnesium: 2.2 mg/dL (ref 1.5–2.5)

## 2010-10-24 LAB — CBC
MCV: 89.2 fL (ref 78.0–100.0)
Platelets: 252 10*3/uL (ref 150–400)
RBC: 4.08 MIL/uL (ref 3.87–5.11)
WBC: 7.8 10*3/uL (ref 4.0–10.5)

## 2010-10-24 LAB — LACTIC ACID, PLASMA: Lactic Acid, Venous: 2.1 mmol/L (ref 0.5–2.2)

## 2010-10-25 LAB — URINE CULTURE
Colony Count: NO GROWTH
Culture: NO GROWTH

## 2010-10-25 LAB — CBC
HCT: 30.5 % — ABNORMAL LOW (ref 36.0–46.0)
MCH: 29.4 pg (ref 26.0–34.0)
MCV: 90.5 fL (ref 78.0–100.0)
RBC: 3.37 MIL/uL — ABNORMAL LOW (ref 3.87–5.11)
WBC: 8.1 10*3/uL (ref 4.0–10.5)

## 2010-10-25 LAB — BASIC METABOLIC PANEL
BUN: 35 mg/dL — ABNORMAL HIGH (ref 6–23)
Chloride: 119 mEq/L — ABNORMAL HIGH (ref 96–112)
GFR calc Af Amer: 55 mL/min — ABNORMAL LOW (ref >90)
Potassium: 3.7 mEq/L (ref 3.5–5.1)

## 2010-10-25 LAB — GLUCOSE, CAPILLARY
Glucose-Capillary: 84 mg/dL (ref 70–99)
Glucose-Capillary: 73 mg/dL (ref 70–99)
Glucose-Capillary: 61 mg/dL — ABNORMAL LOW (ref 70–99)

## 2010-10-25 LAB — CARDIAC PANEL(CRET KIN+CKTOT+MB+TROPI): Troponin I: <0.3 ng/mL (ref <0.30)

## 2010-10-26 ENCOUNTER — Inpatient Hospital Stay (HOSPITAL_COMMUNITY): Payer: Medicare Other

## 2010-10-26 DIAGNOSIS — M79609 Pain in unspecified limb: Secondary | ICD-10-CM

## 2010-10-26 DIAGNOSIS — M7989 Other specified soft tissue disorders: Secondary | ICD-10-CM

## 2010-10-26 LAB — GLUCOSE, CAPILLARY
Glucose-Capillary: 121 mg/dL — ABNORMAL HIGH (ref 70–99)
Glucose-Capillary: 140 mg/dL — ABNORMAL HIGH (ref 70–99)
Glucose-Capillary: 184 mg/dL — ABNORMAL HIGH (ref 70–99)
Glucose-Capillary: 212 mg/dL — ABNORMAL HIGH (ref 70–99)
Glucose-Capillary: 229 mg/dL — ABNORMAL HIGH (ref 70–99)
Glucose-Capillary: 279 mg/dL — ABNORMAL HIGH (ref 70–99)
Glucose-Capillary: 99 mg/dL (ref 70–99)
Glucose-Capillary: 76 mg/dL (ref 70–99)

## 2010-10-26 LAB — CBC
HCT: 30.8 % — ABNORMAL LOW (ref 36.0–46.0)
Hemoglobin: 10.1 g/dL — ABNORMAL LOW (ref 12.0–15.0)
MCHC: 32.8 g/dL (ref 30.0–36.0)
MCV: 89 fL (ref 78.0–100.0)
RDW: 15.3 % (ref 11.5–15.5)

## 2010-10-26 LAB — COMPREHENSIVE METABOLIC PANEL
Alkaline Phosphatase: 50 U/L (ref 39–117)
BUN: 17 mg/dL (ref 6–23)
CO2: 19 mEq/L (ref 19–32)
Calcium: 8.3 mg/dL — ABNORMAL LOW (ref 8.4–10.5)
GFR calc Af Amer: 87 mL/min — ABNORMAL LOW (ref >90)
GFR calc non Af Amer: 75 mL/min — ABNORMAL LOW (ref >90)
Glucose, Bld: 113 mg/dL — ABNORMAL HIGH (ref 70–99)
Potassium: 3.2 mEq/L — ABNORMAL LOW (ref 3.5–5.1)
Total Protein: 5.4 g/dL — ABNORMAL LOW (ref 6.0–8.3)

## 2010-10-27 ENCOUNTER — Inpatient Hospital Stay (HOSPITAL_COMMUNITY): Payer: Medicare Other

## 2010-10-27 LAB — GLUCOSE, CAPILLARY
Glucose-Capillary: 98 mg/dL (ref 70–99)
Glucose-Capillary: 74 mg/dL (ref 70–99)
Glucose-Capillary: 92 mg/dL (ref 70–99)

## 2010-10-27 LAB — CBC
HCT: 31.5 % — ABNORMAL LOW (ref 36.0–46.0)
Hemoglobin: 10.5 g/dL — ABNORMAL LOW (ref 12.0–15.0)
MCH: 29.4 pg (ref 26.0–34.0)
MCHC: 33.3 g/dL (ref 30.0–36.0)
MCV: 88.2 fL (ref 78.0–100.0)
Platelets: 243 K/uL (ref 150–400)
RBC: 3.57 MIL/uL — ABNORMAL LOW (ref 3.87–5.11)
RDW: 15.1 % (ref 11.5–15.5)
WBC: 9.5 K/uL (ref 4.0–10.5)

## 2010-10-27 LAB — VITAMIN B12: Vitamin B-12: >2000 pg/mL — ABNORMAL HIGH (ref 211–911)

## 2010-10-27 LAB — COMPREHENSIVE METABOLIC PANEL
BUN: 9 mg/dL (ref 6–23)
CO2: 23 mEq/L (ref 19–32)
Calcium: 8.3 mg/dL — ABNORMAL LOW (ref 8.4–10.5)
Creatinine, Ser: 0.74 mg/dL (ref 0.50–1.10)
GFR calc Af Amer: 87 mL/min — ABNORMAL LOW (ref >90)
GFR calc non Af Amer: 75 mL/min — ABNORMAL LOW (ref >90)
Glucose, Bld: 95 mg/dL (ref 70–99)
Sodium: 134 mEq/L — ABNORMAL LOW (ref 135–145)
Total Protein: 5.7 g/dL — ABNORMAL LOW (ref 6.0–8.3)

## 2010-10-27 LAB — AMMONIA: Ammonia: 13 umol/L (ref 11–60)

## 2010-10-27 LAB — TSH: TSH: 0.73 u[IU]/mL (ref 0.350–4.500)

## 2010-10-27 LAB — CORTISOL-AM, BLOOD: Cortisol - AM: 11.7 ug/dL (ref 4.3–22.4)

## 2010-10-27 LAB — FOLATE: Folate: 6.5 ng/mL

## 2010-10-27 NOTE — H&P (Signed)
[name], [name]               ACCOUNT NO.:  [id number]  MEDICAL RECORD NO.:  [id number]  LOCATION:  1235                         FACILITY:  [facility]  PHYSICIAN:  __________, M.D.       DATE OF BIRTH:  10/27/1922  DATE OF ADMISSION:  10/24/2010 DATE OF DISCHARGE:                             HISTORY & PHYSICAL   CHIEF COMPLAINT:  Altered mental status.  HISTORY OF PRESENT ILLNESS:  [name] is an 77 year old female whose history is obtained from the emergency department as [name] herself cannot provide any history.  Apparently, she was sent here from her nursing home facility because of hypoxemia with O2 sats 85% on room air and because of a glucose of 60.  It is reported that she has a history of advanced dementia.  However, looking at her past medical record she was just discharged here October 03, 2010, and it sounds like, per discharge summary from Dr. Lavera Guise, that her mentation was normal at that time, and that she went to the nursing home facility for a gait abnormality.  However, now, [name], is noncommunicative.  She appears extremely dehydrated, and she has had several episodes of diarrhea while she is here in the emergency room.  She, again, looks overall volume depleted.  When she got to the ED her blood pressure was in the 60s. She has received almost 2 L of normal saline, and her systolic blood pressure has been around 100.  REVIEW OF SYSTEMS:  Otherwise unobtainable except what is stated in the HPI.  PAST MEDICAL HISTORY: 1. Frequent falls. 2. Hypertension intolerant of a lot of medications due to dizziness     and falls. 3. Complete heart attack, status post permanent pacemaker. 4. Chronic anemia. 5. History of hyponatremia in the past.  ALLERGIES:  Listed: 1. PENICILLIN CAUSES ITCHING. 2. HYDROCODONE. 3. AMLODIPINE.  SOCIAL HISTORY:  She is listed as full code a on her nursing home records, otherwise unobtainable.  MEDICATIONS: 1. Aspirin. 2.  Calcium. 3. Haldol 5 mg every 6 hours as needed for agitation. 4. Lisinopril 5 mg daily. 5. MiraLAX. 6. Vitamin B12.  PHYSICAL EXAMINATION:  VITALS: She is afebrile, systolic blood pressure is around 100 now, previously as low as 60.  She is 100% on 2 L nasal cannula.  Respiratory rate is normal. GENERAL:  She is cachectic.  She does not respond verbally, but she does follow simple commands and she can move her left arm when asked and move her right arm when asked.  She can stick out her tongue.  She appears very dehydrated.  She has got on her forehead some resolving bruising indicating a recent fall. COR:  Regular rate and rhythm without murmurs, rubs, or gallops. CHEST:  Clear to auscultation bilaterally.  No wheezing, rhonchi, rales. ABDOMEN:  Is soft, nontender, no hepatosplenomegaly. EXTREMITIES:  No clubbing, cyanosis, or edema. PSYCH:  No agitation at this point. SKIN:  No rashes.  LABORATORY DATA:  Her ABGs' pH is normal with a PO2 of 163, pCO2 OF 33.7.  Urine shows many bacteria, but is negative for leukocytes, negative for nitrite.  He has ketones.  Her glucose on her BMP is  123, BUN and creatinine 55 and 1.75.  Her lactic acid level 2.1.  Troponin 0.1.  The CBC is normal.  IMAGING DATA:  Chest x-ray:  No infiltrate or heart failure. CT of her head shows a right frontal scalp hematoma with no acute intracranial abnormalities.  ASSESSMENT/PLAN:  This is an 77 year old female who presents with encephalopathy which is likely multifactorial. 1. Acute hypoxic respiratory failure of unclear etiology.  She is     certainly at high risk for aspiration, although her chest x-ray is     negative.  I am going to cover her with some Avelox and watch this     closely. 2. Dehydration with acute renal failure.  I am going to place her on     IV fluids.  Proceed to pressors if needed.  However, she is     responding quite nicely just to fluid resuscitation. 3. Significant  hypotension either from dehydration or sepsis     particularly with her diarrhea that she has been having in the ED.     I am going to obtain blood cultures.  Her urine looks pretty clean,     and we will send off for stool and make sure she does not have C.     diff.  We will also add on a procalcitonin level. 4. Hypoglycemia reported in the nursing home.  I will check two 2-hour     glucose checks. 5. Looks a like a recent fall with the ecchymosis on her forehead, and     her CT of her head does not show any acute hematomas or anything     intracranially.  We will obtain neuro checks every 4 hours. 6. We need to clarify her code status with the next of kin.  She is     listed as full code, but according to her last history and physical     done by Dr. Kaylyn Layer it sounds like she was wanting to be DNR at that     time but that was never clarified during her last hospitalization.     Further recommendation pending over hospital course.    ______________________________ Tarry Kos, MD   ______________________________ __________, M.D.    RD/MEDQ  D:  10/24/2010  T:  10/24/2010  Job:  [id number]  Electronically Signed by Tarry Kos MD on 10/27/2010 07:50:53 PM

## 2010-10-28 ENCOUNTER — Inpatient Hospital Stay (HOSPITAL_COMMUNITY): Payer: Medicare Other

## 2010-10-28 LAB — STOOL CULTURE

## 2010-10-28 LAB — BASIC METABOLIC PANEL
CO2: 21 mEq/L (ref 19–32)
Chloride: 104 mEq/L (ref 96–112)
Creatinine, Ser: 0.58 mg/dL (ref 0.50–1.10)
GFR calc Af Amer: >90 mL/min (ref >90)
Sodium: 133 mEq/L — ABNORMAL LOW (ref 135–145)

## 2010-10-28 LAB — GLUCOSE, CAPILLARY
Glucose-Capillary: 24 mg/dL — CL (ref 70–99)
Glucose-Capillary: 41 mg/dL — CL (ref 70–99)
Glucose-Capillary: 90 mg/dL (ref 70–99)
Glucose-Capillary: 125 mg/dL — ABNORMAL HIGH (ref 70–99)
Glucose-Capillary: <10 mg/dL — CL (ref 70–99)
Glucose-Capillary: 144 mg/dL — ABNORMAL HIGH (ref 70–99)

## 2010-10-28 LAB — CBC
HCT: 30.9 % — ABNORMAL LOW (ref 36.0–46.0)
MCV: 86.6 fL (ref 78.0–100.0)
Platelets: 210 10*3/uL (ref 150–400)
RBC: 3.57 MIL/uL — ABNORMAL LOW (ref 3.87–5.11)
RDW: 14.8 % (ref 11.5–15.5)
WBC: 7.5 10*3/uL (ref 4.0–10.5)

## 2010-10-28 MED ORDER — XENON XE 133 GAS
10.0000 | GAS_FOR_INHALATION | Freq: Once | RESPIRATORY_TRACT | Status: AC | PRN
  Administered 2010-10-28: 10 via RESPIRATORY_TRACT

## 2010-10-28 MED ORDER — TECHNETIUM TO 99M ALBUMIN AGGREGATED
5.2000 | Freq: Once | INTRAVENOUS | Status: AC | PRN
  Administered 2010-10-28: 5 via INTRAVENOUS

## 2010-10-29 ENCOUNTER — Inpatient Hospital Stay (HOSPITAL_COMMUNITY): Payer: Medicare Other

## 2010-10-29 LAB — CBC
Hemoglobin: 9.2 g/dL — ABNORMAL LOW (ref 12.0–15.0)
MCH: 29.8 pg (ref 26.0–34.0)
MCHC: 34.5 g/dL (ref 30.0–36.0)
MCV: 86.4 fL (ref 78.0–100.0)
RBC: 3.09 MIL/uL — ABNORMAL LOW (ref 3.87–5.11)

## 2010-10-29 LAB — COMPREHENSIVE METABOLIC PANEL
ALT: 9 U/L (ref 0–35)
Alkaline Phosphatase: 54 U/L (ref 39–117)
BUN: 4 mg/dL — ABNORMAL LOW (ref 6–23)
CO2: 24 mEq/L (ref 19–32)
Chloride: 102 mEq/L (ref 96–112)
GFR calc Af Amer: >90 mL/min (ref >90)
GFR calc non Af Amer: 83 mL/min — ABNORMAL LOW (ref >90)
Glucose, Bld: 103 mg/dL — ABNORMAL HIGH (ref 70–99)
Potassium: 3.1 mEq/L — ABNORMAL LOW (ref 3.5–5.1)
Sodium: 131 mEq/L — ABNORMAL LOW (ref 135–145)
Total Bilirubin: 0.4 mg/dL (ref 0.3–1.2)

## 2010-10-29 LAB — GLUCOSE, CAPILLARY
Glucose-Capillary: 120 mg/dL — ABNORMAL HIGH (ref 70–99)
Glucose-Capillary: 139 mg/dL — ABNORMAL HIGH (ref 70–99)

## 2010-10-30 LAB — CULTURE, BLOOD (ROUTINE X 2)
Culture  Setup Time: 201210111419
Culture: NO GROWTH
Culture: NO GROWTH

## 2010-11-02 NOTE — Discharge Summary (Signed)
NAMEKWEEN, BACORN               ACCOUNT NO.:  [id number]  MEDICAL RECORD NO.:  [id number]  LOCATION:  1506                         FACILITY:  [facility]  PHYSICIAN:  Dylann Gallier I Osborne Serio, MD      DATE OF BIRTH:  03-Nov-1922  DATE OF ADMISSION:  10/24/2010 DATE OF DISCHARGE:  10/29/2010                              DISCHARGE SUMMARY   DISCHARGE DIAGNOSES: 1. Acute renal failure, resolved. 2. Hypokalemia. 3. Altered mental status felt to be possibly secondary to dementia and     metabolic encephalopathy secondary to hypoglycemia, the patient     experienced. 4. Transient hypoglycemia resolved. 5. Transient hypoxemia felt to be secondary to aspiration resolved. 6. Diarrhea, resolved. 7. Anemia need to be followed and monitored as outpatient. 8. Status post multiple falls. 9. History of complete heart block, status post pacemaker. 10.History of chronic anemia. 11.Oral thrush.  DISCHARGE MEDICATIONS: 1. Fluconazole 100 mg p.o. daily for 7 days. 2. Avelox 400 mg p.o. daily for 2 days. 3. Namenda 10 mg p.o. daily. 4. Potassium chloride 10 mEq p.o. daily. 5. Aspirin 81 mg daily. 6. Calcium carbonate/vitamin D OTC 1 tablet daily. 7. Haldol 0.5 mg IM injection q.6 h. as needed. 8. MiraLax 17 g p.o. daily. 9. Vitamin B12, 1 tablet daily.  CONSULTATIONS:  None.  PROCEDURES: 1. Chest x-ray, pacemaker.  No active disease. 2. CT head without contrast, resolving right frontal scalp hematoma. 3. Chest x-ray, small right effusion. 4. V/Q scan negative for pulmonary embolism. 5. Chest x-ray, central pulmonary vascular prominence without edema or     infiltrate.  Pacemaker in place with top-normal heart size. 6. CT head without contrast with stable atrophy and chronic     microvascular ischemic changes.  HISTORY OF PRESENT ILLNESS:  This is an 77 year old female.  History is obtained from the emergency department as Ms. Bauserman herself cannot provide any history.  Apparently, the patient brought  from nursing home because of hypoxemia with oxygen saturation of 85% and glucose of 60. She had a history of advanced dementia, which contradict the last discharge done by Dr. Lavera Guise.  On close questioning the niece, the patient has history of confusion for the last 2 months.  CT scan suggest chronic microvascular change and atrophy, which could be related to the dementia. 1. Acute hypoxic respiratory failure.  On admission, the patient found     low CBG and this is felt to be secondary to decreased p.o. intake.     This is completely resolved during hospital stay with feeding.  We     recommend providing snacks with meals. 2. Acute renal failure, resolved secondary to decreased p.o. intake. 3. Acute hypoxic cause, not clear.  V/Q scan was negative and treated     as a case of aspiration pneumonia with Avelox IV and this has     completely resolved currently.  Oxygen saturation 98% on room air. 4. Hypokalemia.  Supplement will be provided on discharge. 5. Altered mental status.  The patient was really confused during     admission and currently, the patient is awake, knew where is she     and knew surrounding, but sometimes talking out of her  mind.  She     knew where is she.  She knew she is at [employer].  She knew her     knees.  Really we do not know what her baseline.  We will start the     patient on Namenda 10 mg p.o. daily, anyhow any reversible cause of     dementia excluded.  Ammonia was normal.  Vitamin B12 within normal     and folic acid 6.5.  TSH within normal.  CT scan repeated twice and     only atrophy and chronic microvascular ischemic change and no acute     stroke.  The patient will continue her aspirin dose.  We cannot do     an MRI secondary to her pacemaker.  The patient kept under     telemetry monitor.  She has a paced rhythm.  Her troponin was     negative and the patient denies any chest pain.  No further     investigation was done.  Her CK-MB was elevated at  6.1 and creatine     total kinase 462 and this is felt could be secondary to     rhabdomyolysis from the recent fall.  No troponin elevated and as     we mentioned, the patient has no chest pain.  The patient evaluated     by swallow and speech and recommended pureed thin liquids and her     altered mental status completely resolved and currently, the     patient is at her baseline per her niece at the nursing home     facility.  The patient was never been aggressive in the hospital,     but she has episode of talking about the Bible.  I think that is     the patient's baseline and her niece agreed with me.  Currently, we     felt the patient is stable for discharge.  Her urine culture was     negative and blood culture was negative.  She has an oral thrush     and the patient was started on Diflucan, at this time, we felt the     patient stable for discharge and need to follow up with her MD as     an outpatient and the patient was started on Namenda.     Bridney Guadarrama Bosie Helper, MD     HIE/MEDQ  D:  10/29/2010  T:  10/29/2010  Job:  [id number]  Electronically Signed by Ebony Cargo MD on 11/02/2010 12:27:22 PM

## 2011-01-02 ENCOUNTER — Emergency Department (HOSPITAL_COMMUNITY): Payer: Medicare Other

## 2011-01-02 ENCOUNTER — Other Ambulatory Visit: Payer: Self-pay

## 2011-01-02 ENCOUNTER — Emergency Department (HOSPITAL_COMMUNITY)
Admission: EM | Admit: 2011-01-02 | Discharge: 2011-01-02 | Disposition: A | Discharge status: Skilled Nursing Facility | Payer: Medicare Other | Attending: Emergency Medicine | Admitting: Emergency Medicine

## 2011-01-02 DIAGNOSIS — I428 Other cardiomyopathies: Secondary | ICD-10-CM | POA: Insufficient documentation

## 2011-01-02 DIAGNOSIS — I509 Heart failure, unspecified: Secondary | ICD-10-CM | POA: Insufficient documentation

## 2011-01-02 DIAGNOSIS — R55 Syncope and collapse: Secondary | ICD-10-CM

## 2011-01-02 DIAGNOSIS — E785 Hyperlipidemia, unspecified: Secondary | ICD-10-CM | POA: Insufficient documentation

## 2011-01-02 DIAGNOSIS — Z950 Presence of cardiac pacemaker: Secondary | ICD-10-CM | POA: Insufficient documentation

## 2011-01-02 DIAGNOSIS — I10 Essential (primary) hypertension: Secondary | ICD-10-CM | POA: Insufficient documentation

## 2011-01-02 LAB — COMPREHENSIVE METABOLIC PANEL
ALT: 20 U/L (ref 0–35)
AST: 44 U/L — ABNORMAL HIGH (ref 0–37)
Albumin: 3.5 g/dL (ref 3.5–5.2)
Calcium: 9.5 mg/dL (ref 8.4–10.5)
Chloride: 94 mEq/L — ABNORMAL LOW (ref 96–112)
Creatinine, Ser: 0.74 mg/dL (ref 0.50–1.10)
Sodium: 129 mEq/L — ABNORMAL LOW (ref 135–145)
Total Bilirubin: 0.3 mg/dL (ref 0.3–1.2)

## 2011-01-02 LAB — DIFFERENTIAL
Basophils Absolute: 0.1 10*3/uL (ref 0.0–0.1)
Basophils Relative: 1 % (ref 0–1)
Monocytes Absolute: 0.3 10*3/uL (ref 0.1–1.0)
Neutro Abs: 4.8 10*3/uL (ref 1.7–7.7)

## 2011-01-02 LAB — CBC
HCT: 33.9 % — ABNORMAL LOW (ref 36.0–46.0)
MCHC: 34.8 g/dL (ref 30.0–36.0)
Platelets: 255 10*3/uL (ref 150–400)
RDW: 14.5 % (ref 11.5–15.5)
WBC: 6.8 10*3/uL (ref 4.0–10.5)

## 2011-01-02 LAB — CARDIAC PANEL(CRET KIN+CKTOT+MB+TROPI)
CK, MB: 2.1 ng/mL (ref 0.3–4.0)
Relative Index: INVALID (ref 0.0–2.5)
Total CK: 82 U/L (ref 7–177)
Troponin I: <0.3 ng/mL (ref <0.30)

## 2011-01-02 LAB — URINALYSIS, ROUTINE W REFLEX MICROSCOPIC
Bilirubin Urine: NEGATIVE
Hgb urine dipstick: NEGATIVE
Protein, ur: NEGATIVE mg/dL
Urobilinogen, UA: 0.2 mg/dL (ref 0.0–1.0)

## 2011-01-02 MED ORDER — SODIUM CHLORIDE 0.9 % IV SOLN
Freq: Once | INTRAVENOUS | Status: AC
  Administered 2011-01-02: 16:00:00 via INTRAVENOUS

## 2011-01-02 NOTE — ED Notes (Signed)
-   PTAR CALLED AND WILL BE ON-SITE FOR P/U TO "[facility]". - REPORT GIVEN TO TANYA, LPN @ 1610 ([facility]).

## 2011-01-02 NOTE — ED Notes (Signed)
WGN:FAOZ<HY> Expected date:01/02/11<BR> Expected time: 2:15 PM<BR> Means of arrival:Ambulance<BR> Comments:<BR> EMS 110 GC - orthostatic/near syncope 77 y/o f

## 2011-01-02 NOTE — ED Notes (Signed)
Per EMS from [facility] was talking to family earlier and had a near syncopal eppisode. BP 92/72 on arrival of EMS. Now 152/109 sitting up. CBG 124.

## 2011-01-02 NOTE — ED Provider Notes (Signed)
History     CSN: [id number]  Arrival date & time 01/02/11  1414   First MD Initiated Contact with Patient 01/02/11 1508      Chief Complaint  Patient presents with  . Near Syncope    (Consider location/radiation/quality/duration/timing/severity/associated sxs/prior treatment) The history is provided by the patient.   Patient here with having a near syncopal event with an associate low blood pressure. Patient denies any recent illness of vomiting, diarrhea, chest pain, abdominal pain. No headaches. Nothing makes her symptoms better or worse. Patient was brought in by EMS to give the patient IV fluids the patient's blood pressure is now stable. Past Medical History  Diagnosis Date  . Hypertension   . Hyperlipidemia   . Cardiomyopathy   . Pacemaker     [employer] [id number]  . PVD (peripheral vascular disease) 2007    stenting b legs 2007,   . Allergic rhinitis   . GERD (gastroesophageal reflux disease)   . Osteoporosis   . Osteoarthritis   . Gallstones     per [place] 11-2008, also no AAA  . CHF (congestive heart failure)   . Pacemaker     Past Surgical History  Procedure Date  . Pacemaker insertion     permanent   . Breast biopsy benign     Family History  Problem Relation Age of Onset  . Hypertension    . Breast cancer Neg Hx   . Colon cancer Neg Hx     History  Substance Use Topics  . Smoking status: Former Games developer  . Smokeless tobacco: Not on file  . Alcohol Use: No    OB History    Grav Para Term Preterm Abortions TAB SAB Ect Mult Living                  Review of Systems  All other systems reviewed and are negative.    Allergies  Amlodipine; Hydrocodone; and Penicillins  Home Medications   Current Outpatient Rx  Name Route Sig Dispense Refill  . ASPIRIN EC 81 MG PO TBEC Oral Take 81 mg by mouth daily.      Marland Kitchen CALCIUM-VITAMIN D 500-200 MG-UNIT PO TABS Oral Take 1 tablet by mouth daily.      Marland Kitchen MEMANTINE HCL 10 MG PO TABS Oral Take 10  mg by mouth daily.      Marland Kitchen METRONIDAZOLE 500 MG PO TABS Oral Take 500 mg by mouth 3 (three) times daily. Patient completed 14 day therapy course on 12/27/2010    . POLYETHYLENE GLYCOL 3350 PO PACK Oral Take 17 g by mouth every other day.     Marland Kitchen POTASSIUM CHLORIDE CRYS CR 10 MEQ PO TBCR Oral Take 10 mEq by mouth daily.      Marland Kitchen SACCHAROMYCES BOULARDII 250 MG PO CAPS Oral Take 250 mg by mouth 2 (two) times daily.     Marland Kitchen VANCOMYCIN HCL 125 MG PO CAPS Oral Take 125 mg by mouth every 6 (six) hours. Patient completed 14 day therapy course on 12/27/2010     . VITAMIN B-12 1000 MCG PO TABS Oral Take 1,000 mcg by mouth daily.        BP 142/52  Pulse 62  Temp(Src) 97.8 F (36.6 C) (Oral)  Resp 18  SpO2 100%  Physical Exam  Nursing note and vitals reviewed. Constitutional: She is oriented to person, place, and time. She appears well-developed and well-nourished.  Non-toxic appearance. No distress.  HENT:  Head: Normocephalic and atraumatic.  Eyes:  Conjunctivae, EOM and lids are normal. Pupils are equal, round, and reactive to light.  Neck: Normal range of motion. Neck supple. No tracheal deviation present. No mass present.  Cardiovascular: Normal rate, regular rhythm and normal heart sounds.  Exam reveals no gallop.   No murmur heard. Pulmonary/Chest: Effort normal and breath sounds normal. No stridor. No respiratory distress. She has no decreased breath sounds. She has no wheezes. She has no rhonchi. She has no rales.  Abdominal: Soft. Normal appearance and bowel sounds are normal. She exhibits no distension. There is no tenderness. There is no rebound and no CVA tenderness.  Musculoskeletal: Normal range of motion. She exhibits no edema and no tenderness.  Neurological: She is alert and oriented to person, place, and time. She has normal strength. No cranial nerve deficit or sensory deficit. GCS eye subscore is 4. GCS verbal subscore is 5. GCS motor subscore is 6.  Skin: Skin is warm and dry. No  abrasion and no rash noted.  Psychiatric: She has a normal mood and affect. Her speech is normal and behavior is normal.    ED Course  Procedures (including critical care time)  Labs Reviewed - No data to display No results found.   No diagnosis found.    MDM   Date: 01/02/2011  Rate: 72  Rhythm: atrial fibrillation  QRS Axis: normal  Intervals: normal  ST/T Wave abnormalities: nonspecific ST/T changes  Conduction Disutrbances:left bundle branch block  Narrative Interpretation:   Old EKG Reviewed: unchanged   Spoke with patient's family at length and patient was sitting in a chair talking to them when patient had a period of about 10 minutes where she did not respond them. Questionable seizure activity noted, however no postictal period. Patient has been stable since then. She denies any headache, vomiting at this time. Repeat blood pressures have been stable. Patient to be discharged back home patient likely to have had orthostatic changes which contribute to her low blood pressure.       Toy Baker, MD 01/02/11 Ebony Cargo

## 2011-01-02 NOTE — ED Notes (Signed)
MD at bedside. 

## 2011-01-02 NOTE — ED Notes (Signed)
Pt from [facility] and they  Reported that she has had a change in LOC and was found slumped in her chair with a BP of 80/36 down from 145/80 this AM. Also decrease in responsiveness and clammy skin. Pt is now sitting in bed with BP 142/52

## 2011-01-02 NOTE — ED Notes (Signed)
Patient denies pain and is resting comfortably.  

## 2011-01-02 NOTE — ED Notes (Signed)
Patient transported to X-ray 

## 2011-01-02 NOTE — ED Notes (Signed)
Patient is resting comfortably. 

## 2011-01-02 NOTE — ED Notes (Signed)
Vital signs stable. 

## 2011-01-05 LAB — URINE CULTURE: Colony Count: >=100000

## 2011-01-27 ENCOUNTER — Encounter: Payer: Self-pay | Admitting: *Deleted

## 2011-02-12 ENCOUNTER — Telehealth: Payer: Self-pay | Admitting: Internal Medicine

## 2011-02-12 NOTE — Telephone Encounter (Signed)
According to [name] at [facility] pt  has not been seen by Dr. Ladona Ridgel since she has been living there . Patient has a pacemaker she  has been c/o of chest discomfort for several weeks. [name] states ,pt  needs to be seen soon. An appointment was made with Tereso Newcomer PA for 02/14/11 at 11:05 AM [name] aware.

## 2011-02-12 NOTE — Telephone Encounter (Signed)
New Problem   Diane Nurse [facility] or [facility] Director of [address] [place]) [phone]  Patient is complain of chest discomfort or gas pains, please call Diane or Carollee Herter back at the [place] assisted living facility to schedule.

## 2011-02-14 ENCOUNTER — Encounter: Payer: Self-pay | Admitting: Physician Assistant

## 2011-02-14 ENCOUNTER — Ambulatory Visit (INDEPENDENT_AMBULATORY_CARE_PROVIDER_SITE_OTHER): Payer: Medicare Other | Admitting: Physician Assistant

## 2011-02-14 ENCOUNTER — Encounter: Payer: Medicare Other | Admitting: *Deleted

## 2011-02-14 VITALS — BP 137/80 | HR 72 | Resp 18 | Ht 64.0 in | Wt 138.0 lb

## 2011-02-14 DIAGNOSIS — I442 Atrioventricular block, complete: Secondary | ICD-10-CM

## 2011-02-14 DIAGNOSIS — I10 Essential (primary) hypertension: Secondary | ICD-10-CM

## 2011-02-14 DIAGNOSIS — I428 Other cardiomyopathies: Secondary | ICD-10-CM

## 2011-02-14 DIAGNOSIS — I739 Peripheral vascular disease, unspecified: Secondary | ICD-10-CM

## 2011-02-14 NOTE — Progress Notes (Signed)
[address] [place], [place]  [address] Phone: [phone] Fax:  [phone]  Date:  02/14/2011   Name:  [name]       DOB:  1922-06-28 MRN:  [id number]  PCP:  Dr. Drue Novel  Primary Cardiologist:  Dr. Tonny Bollman  Primary Electrophysiologist:  Dr. Lewayne Bunting    History of Present Illness: [name] is a 78 y.o. female who presents for evaluation of chest pain.  She has a history of hypertension, hyperlipidemia, presumed ischemic cardiomyopathy, PAD, status post stenting to bilateral lower extremities and 2007, bradycardia, status post pacemaker.  In the past, she has had an EF of 45% with apparent wall motion abnormalities on echocardiogram.  Last echocardiogram 3/09: EF 60%, mild LVH, diastolic dysfunction, mild AI, mild degree dilatation, mild MR, and MAC, apical cavity obliteration in systole.  Cardiolite scan 11/02: No ischemia or scar, EF 45%.  Carotid Doppler 7/11:1-39% bilateral.  She was last seen by Dr. Excell Seltzer 9/26.  She denies any chest pain.  She apparently has some baseline dementia.   Notes indicate she has had some chest pain for a couple weeks according to RN at [facility].  [name] denies chest pressure or tightness.  No dyspnea at rest.  She is in a wheelchair most of the time.  No orthopnea, PND or edema.  No syncope.  The patient says she is here to get her pacer checked.  Pacer interrogated.  She has 4.5 years of battery life and is dependent.    Past Medical History  Diagnosis Date  . Hypertension   . Hyperlipidemia   . Cardiomyopathy   . Pacemaker     [employer] [id number]  . PVD (peripheral vascular disease) 2007    stenting b legs 2007,   . Allergic rhinitis   . GERD (gastroesophageal reflux disease)   . Osteoporosis   . Osteoarthritis   . Gallstones     per [place] 11-2008, also no AAA  . CHF (congestive heart failure)   . Pacemaker     Current Outpatient Prescriptions  Medication Sig Dispense  Refill  . aspirin EC 81 MG tablet Take 81 mg by mouth daily.        . Calcium Carbonate-Vitamin D (CALCIUM-VITAMIN D) 500-200 MG-UNIT per tablet Take 1 tablet by mouth daily.        . memantine (NAMENDA) 10 MG tablet Take 10 mg by mouth daily.        . metroNIDAZOLE (FLAGYL) 500 MG tablet Take 500 mg by mouth 3 (three) times daily. Patient completed 14 day therapy course on 12/27/2010      . polyethylene glycol (MIRALAX / GLYCOLAX) packet Take 17 g by mouth every other day.       . potassium chloride (K-DUR,KLOR-CON) 10 MEQ tablet Take 10 mEq by mouth daily.        . vancomycin (VANCOCIN) 125 MG capsule Take 125 mg by mouth every 6 (six) hours. Patient completed 14 day therapy course on 12/27/2010       . vitamin B-12 (CYANOCOBALAMIN) 1000 MCG tablet Take 1,000 mcg by mouth daily.          Allergies: Allergies  Allergen Reactions  . Amlodipine Other (See Comments)    Dizziness & elevated BP  . Hydrocodone Itching  . Penicillins Other (See Comments)    Per MAR    History  Substance Use Topics  . Smoking status: Former Games developer  . Smokeless tobacco:  Not on file  . Alcohol Use: No     ROS:  Please see the history of present illness.   Notes some dry skin over pacer site that is pruritic.  All other systems reviewed and negative.   PHYSICAL EXAM: VS:  BP 137/80  Pulse 72  Resp 18  Ht 5\' 4"  (1.626 m)  Wt 138 lb (62.596 kg)  BMI 23.69 kg/m2 Well nourished, well developed, in no acute distress HEENT: normal Neck: no JVD Cardiac:  normal S1, S2; RRR; no murmur Chest: pacer site stable; note some dry skin over site Lungs:  clear to auscultation bilaterally, no wheezing, rhonchi or rales Abd: soft, nontender, no hepatomegaly Ext: no edema Skin: warm and dry Neuro:  CNs 2-12 intact, no focal abnormalities noted  EKG:  Underlying sinus rhythm, ventricular paced, heart rate 72  ASSESSMENT AND PLAN:

## 2011-02-14 NOTE — Assessment & Plan Note (Signed)
Controlled.  

## 2011-02-14 NOTE — Assessment & Plan Note (Signed)
Followup with Dr. Excell Seltzer as indicated.

## 2011-02-14 NOTE — Patient Instructions (Signed)
NO CHANGES TODAY.  Your physician recommends that you schedule a follow-up appointment in: 6 MONTHS WITH DR. Ladona Ridgel AND TO ALSO HAVE  YOUR PACEMAKER CHECKED THE SAME DAY PER KRISTIN BANKS IN [facility]

## 2011-02-14 NOTE — Assessment & Plan Note (Signed)
Presumed ischemic cardiomyopathy.  Last echocardiogram with normal LV function.  She denies current symptoms of angina.  Continue aspirin.

## 2011-02-14 NOTE — Assessment & Plan Note (Signed)
Pacer interrogated as noted.  Patient doing well.  Follow up in 6 mos with Dr. Lewayne Bunting.

## 2011-02-19 ENCOUNTER — Encounter: Payer: Self-pay | Admitting: Internal Medicine

## 2011-02-19 DIAGNOSIS — I442 Atrioventricular block, complete: Secondary | ICD-10-CM

## 2011-02-19 LAB — PACEMAKER DEVICE OBSERVATION
BAMS-0002: 8 ms
BAMS-0003: 70 {beats}/min
DEVICE MODEL PM: 567731
VENTRICULAR PACING PM: 100

## 2011-08-27 ENCOUNTER — Encounter (HOSPITAL_COMMUNITY): Payer: Self-pay

## 2011-08-27 ENCOUNTER — Emergency Department (HOSPITAL_COMMUNITY)
Admission: EM | Admit: 2011-08-27 | Discharge: 2011-08-27 | Disposition: A | Discharge status: Home / Self Care | Payer: Medicare Other | Attending: Emergency Medicine | Admitting: Emergency Medicine

## 2011-08-27 ENCOUNTER — Emergency Department (HOSPITAL_COMMUNITY): Payer: Medicare Other

## 2011-08-27 ENCOUNTER — Encounter: Payer: Medicare Other | Admitting: Internal Medicine

## 2011-08-27 DIAGNOSIS — Z950 Presence of cardiac pacemaker: Secondary | ICD-10-CM | POA: Insufficient documentation

## 2011-08-27 DIAGNOSIS — E785 Hyperlipidemia, unspecified: Secondary | ICD-10-CM | POA: Insufficient documentation

## 2011-08-27 DIAGNOSIS — K219 Gastro-esophageal reflux disease without esophagitis: Secondary | ICD-10-CM | POA: Insufficient documentation

## 2011-08-27 DIAGNOSIS — I10 Essential (primary) hypertension: Secondary | ICD-10-CM | POA: Insufficient documentation

## 2011-08-27 DIAGNOSIS — R4182 Altered mental status, unspecified: Secondary | ICD-10-CM | POA: Insufficient documentation

## 2011-08-27 DIAGNOSIS — Z79899 Other long term (current) drug therapy: Secondary | ICD-10-CM | POA: Insufficient documentation

## 2011-08-27 DIAGNOSIS — I509 Heart failure, unspecified: Secondary | ICD-10-CM | POA: Insufficient documentation

## 2011-08-27 DIAGNOSIS — F29 Unspecified psychosis not due to a substance or known physiological condition: Secondary | ICD-10-CM | POA: Insufficient documentation

## 2011-08-27 LAB — URINALYSIS, ROUTINE W REFLEX MICROSCOPIC
Nitrite: NEGATIVE
Specific Gravity, Urine: 1.011 (ref 1.005–1.030)
Urobilinogen, UA: 1 mg/dL (ref 0.0–1.0)
pH: 6 (ref 5.0–8.0)

## 2011-08-27 LAB — COMPREHENSIVE METABOLIC PANEL
ALT: 10 U/L (ref 0–35)
CO2: 22 mEq/L (ref 19–32)
Calcium: 9.7 mg/dL (ref 8.4–10.5)
Creatinine, Ser: 0.8 mg/dL (ref 0.50–1.10)
GFR calc Af Amer: 75 mL/min — ABNORMAL LOW (ref >90)
GFR calc non Af Amer: 64 mL/min — ABNORMAL LOW (ref >90)
Glucose, Bld: 94 mg/dL (ref 70–99)
Sodium: 128 mEq/L — ABNORMAL LOW (ref 135–145)
Total Protein: 7.3 g/dL (ref 6.0–8.3)

## 2011-08-27 LAB — CBC WITH DIFFERENTIAL/PLATELET
Basophils Absolute: 0.1 10*3/uL (ref 0.0–0.1)
Eosinophils Absolute: 0.5 10*3/uL (ref 0.0–0.7)
Eosinophils Relative: 9 % — ABNORMAL HIGH (ref 0–5)
Hemoglobin: 12.1 g/dL (ref 12.0–15.0)
Lymphocytes Relative: 26 % (ref 12–46)
MCHC: 34.9 g/dL (ref 30.0–36.0)
Monocytes Absolute: 0.4 10*3/uL (ref 0.1–1.0)
Platelets: 262 10*3/uL (ref 150–400)
RBC: 4.08 MIL/uL (ref 3.87–5.11)
WBC: 5.3 10*3/uL (ref 4.0–10.5)

## 2011-08-27 LAB — URINE MICROSCOPIC-ADD ON

## 2011-08-27 NOTE — ED Notes (Signed)
Per EMS pt from [facility], states pt is confused, refused meds x2 days, ?UTI

## 2011-08-27 NOTE — ED Notes (Signed)
ZOX:[id number]<AV> Expected date:08/27/11<BR> Expected time: 3:58 PM<BR> Means of arrival:Ambulance<BR> Comments:<BR> Confusion ?UTI

## 2011-08-27 NOTE — ED Notes (Signed)
Patient transported to CT 

## 2011-08-27 NOTE — ED Provider Notes (Signed)
History     CSN: [id number]  Arrival date & time 08/27/11  1612   First MD Initiated Contact with Patient 08/27/11 1713      Chief Complaint  Patient presents with  . Altered Mental Status    (Consider location/radiation/quality/duration/timing/severity/associated sxs/prior treatment) Patient is a 78 y.o. female presenting with altered mental status. The history is provided by the patient.  Altered Mental Status   patient here from nursing home with possible urinary tract infection. According to nursing home notes, patient has been compliant with her medications. She denies any fever or vomiting or abdominal flank pain. No headaches recently or neck pain or photophobia. Patient states that she does need to take her medications because she feels fine. Does note some suprapubic fullness and states that she believes she has a urinary tract infection. No recent falls. No meds given prior to arrival  Past Medical History  Diagnosis Date  . Hypertension   . Hyperlipidemia   . Cardiomyopathy   . Pacemaker     [employer] [id number]  . PVD (peripheral vascular disease) 2007    stenting b legs 2007,   . Allergic rhinitis   . GERD (gastroesophageal reflux disease)   . Osteoporosis   . Osteoarthritis   . Gallstones     per [place] 11-2008, also no AAA  . CHF (congestive heart failure)   . Pacemaker     Past Surgical History  Procedure Date  . Pacemaker insertion     permanent   . Breast biopsy benign     Family History  Problem Relation Age of Onset  . Hypertension    . Breast cancer Neg Hx   . Colon cancer Neg Hx     History  Substance Use Topics  . Smoking status: Former Games developer  . Smokeless tobacco: Not on file  . Alcohol Use: No    OB History    Grav Para Term Preterm Abortions TAB SAB Ect Mult Living                  Review of Systems  Psychiatric/Behavioral: Positive for altered mental status.  All other systems reviewed and are  negative.    Allergies  Amlodipine; Hydrocodone; and Penicillins  Home Medications   Current Outpatient Rx  Name Route Sig Dispense Refill  . ASPIRIN EC 81 MG PO TBEC Oral Take 81 mg by mouth daily.      Marland Kitchen CALCIUM-VITAMIN D 500-200 MG-UNIT PO TABS Oral Take 1 tablet by mouth daily.      Marland Kitchen LISINOPRIL 5 MG PO TABS Oral Take 5 mg by mouth daily.    Marland Kitchen LORAZEPAM 2 MG/ML PO CONC Oral Take 0.5 mg by mouth every 6 (six) hours as needed. For anxiety.    Marland Kitchen MEMANTINE HCL 10 MG PO TABS Oral Take 10 mg by mouth 2 (two) times daily.     Marland Kitchen MENTHOL (TOPICAL ANALGESIC) 4 % EX GEL Topical Apply 1 application topically 4 (four) times daily as needed. For pain.    Marland Kitchen NAPROXEN SODIUM 220 MG PO TABS Oral Take 220 mg by mouth 2 (two) times daily as needed. For pain.    . OLOPATADINE HCL 0.2 % OP SOLN Both Eyes Place 1 drop into both eyes daily.    Marland Kitchen POLYETHYLENE GLYCOL 3350 PO PACK Oral Take 17 g by mouth every other day.     Marland Kitchen RISPERIDONE 1 MG/ML PO SOLN Oral Take 0.5 mg by mouth 2 (two) times daily.    Marland Kitchen  RISPERIDONE MICROSPHERES 25 MG IM SUSR Intramuscular Inject 25 mg into the muscle every 14 (fourteen) days.    Marland Kitchen VITAMIN B-12 1000 MCG PO TABS Oral Take 1,000 mcg by mouth daily.        BP 173/52  Pulse 74  Temp 97.9 F (36.6 C) (Oral)  Resp 18  SpO2 100%  Physical Exam  Nursing note and vitals reviewed. Constitutional: She is oriented to person, place, and time. She appears well-developed and well-nourished.  Non-toxic appearance. No distress.  HENT:  Head: Normocephalic and atraumatic.  Eyes: Conjunctivae, EOM and lids are normal. Pupils are equal, round, and reactive to light.  Neck: Normal range of motion. Neck supple. No tracheal deviation present. No mass present.  Cardiovascular: Normal rate, regular rhythm and normal heart sounds.  Exam reveals no gallop.   No murmur heard. Pulmonary/Chest: Effort normal and breath sounds normal. No stridor. No respiratory distress. She has no decreased  breath sounds. She has no wheezes. She has no rhonchi. She has no rales.  Abdominal: Soft. Normal appearance and bowel sounds are normal. She exhibits no distension. There is no tenderness. There is no rebound and no CVA tenderness.  Musculoskeletal: Normal range of motion. She exhibits no edema and no tenderness.  Neurological: She is alert and oriented to person, place, and time. She has normal strength. No cranial nerve deficit or sensory deficit. GCS eye subscore is 4. GCS verbal subscore is 5. GCS motor subscore is 6.  Skin: Skin is warm and dry. No abrasion and no rash noted.  Psychiatric: She has a normal mood and affect. Her speech is normal and behavior is normal.    ED Course  Procedures (including critical care time)   Labs Reviewed  CBC WITH DIFFERENTIAL  COMPREHENSIVE METABOLIC PANEL  URINALYSIS, ROUTINE W REFLEX MICROSCOPIC  URINE CULTURE   No results found.   No diagnosis found.    MDM  Urine results noted and likely from contamination. Patient sodium of 128 noted and this appears be chronic. Patient's mental status is stable. No concern for meningitis. No acute febrile illness noted. She is stable for discharge        Toy Baker, MD 08/27/11 2006

## 2011-08-27 NOTE — ED Notes (Signed)
Communications contacted for transport back to [facility].

## 2011-08-29 LAB — URINE CULTURE

## 2011-10-01 ENCOUNTER — Encounter: Payer: Self-pay | Admitting: *Deleted

## 2011-10-13 ENCOUNTER — Ambulatory Visit (INDEPENDENT_AMBULATORY_CARE_PROVIDER_SITE_OTHER): Payer: Medicare Other | Admitting: Internal Medicine

## 2011-10-13 ENCOUNTER — Encounter: Payer: Self-pay | Admitting: Internal Medicine

## 2011-10-13 VITALS — BP 143/64 | HR 69 | Ht 64.0 in

## 2011-10-13 DIAGNOSIS — Z950 Presence of cardiac pacemaker: Secondary | ICD-10-CM

## 2011-10-13 DIAGNOSIS — I10 Essential (primary) hypertension: Secondary | ICD-10-CM

## 2011-10-13 DIAGNOSIS — I442 Atrioventricular block, complete: Secondary | ICD-10-CM

## 2011-10-13 LAB — PACEMAKER DEVICE OBSERVATION
AL IMPEDENCE PM: 280 Ohm
AL THRESHOLD: 2.7 V
BAMS-0001: 170 {beats}/min
BAMS-0002: 8 ms
BAMS-0003: 70 {beats}/min
DEVICE MODEL PM: 567731
RV LEAD THRESHOLD: 1.5 V

## 2011-10-13 NOTE — Patient Instructions (Addendum)
Your physician wants you to follow-up in: ONE YEAR WITH DR. TAYLOR You will receive a reminder letter in the mail two months in advance. If you don't receive a letter, please call our office to schedule the follow-up appointment.   Your physician recommends that you continue on your current medications as directed. Please refer to the Current Medication list given to you today.  

## 2011-10-13 NOTE — Assessment & Plan Note (Signed)
Her systolic blood pressure is well controlled. She will continue her current medical therapy and maintain a low-sodium diet.

## 2011-10-13 NOTE — Assessment & Plan Note (Signed)
Her [employer] pacemaker is working normally. We'll plan to recheck in several months. 

## 2011-10-13 NOTE — Progress Notes (Signed)
HPI [name] returns today for followup. She is a very pleasant elderly woman with syncope and documented bradycardia, status post pacemaker insertion. In the interim, she has done well. She has had no recurrent syncope. She is in assisted living. She denies chest pain, shortness of breath, or syncope. Minimal peripheral edema. She states " I would like to go back to work." Allergies  Allergen Reactions  . Amlodipine Other (See Comments)    Dizziness & elevated BP  . Hydrocodone Itching  . Penicillins Other (See Comments)    Per [facility]     Current Outpatient Prescriptions  Medication Sig Dispense Refill  . aspirin EC 81 MG tablet Take 81 mg by mouth daily.        . Calcium Carbonate-Vitamin D (CALCIUM-VITAMIN D) 500-200 MG-UNIT per tablet Take 1 tablet by mouth daily.        Marland Kitchen lisinopril (PRINIVIL,ZESTRIL) 5 MG tablet Take 5 mg by mouth daily.      Marland Kitchen LORazepam (ATIVAN) 2 MG/ML concentrated solution Take 0.5 mg by mouth every 6 (six) hours as needed. For anxiety.      . memantine (NAMENDA) 10 MG tablet Take 10 mg by mouth 2 (two) times daily.       . Menthol, Topical Analgesic, (BIOFREEZE) 4 % GEL Apply 1 application topically 4 (four) times daily as needed. For pain.      Marland Kitchen Olopatadine HCl (PATADAY) 0.2 % SOLN Place 1 drop into both eyes daily.      . polyethylene glycol (MIRALAX / GLYCOLAX) packet Take 17 g by mouth every other day.       . vitamin B-12 (CYANOCOBALAMIN) 1000 MCG tablet Take 1,000 mcg by mouth daily.           Past Medical History  Diagnosis Date  . Hypertension   . Hyperlipidemia   . Cardiomyopathy   . Pacemaker     [employer] [id number]  . PVD (peripheral vascular disease) 2007    stenting b legs 2007,   . Allergic rhinitis   . GERD (gastroesophageal reflux disease)   . Osteoporosis   . Osteoarthritis   . Gallstones     per [place] 11-2008, also no AAA  . CHF (congestive heart failure)   . Pacemaker     ROS:   All systems reviewed and negative except  as noted in the HPI.   Past Surgical History  Procedure Date  . Pacemaker insertion     permanent   . Breast biopsy benign      Family History  Problem Relation Age of Onset  . Hypertension    . Breast cancer Neg Hx   . Colon cancer Neg Hx      History   Social History  . Marital Status: Widowed    Spouse Name: N/A    Number of Children: N/A  . Years of Education: N/A   Occupational History  . retired    Social History Main Topics  . Smoking status: Former Games developer  . Smokeless tobacco: Not on file  . Alcohol Use: No  . Drug Use: Not on file  . Sexually Active: Not on file   Other Topics Concern  . Not on file   Social History Narrative   Comes to the office frequently with her nephew [name].Marland KitchenMarland KitchenLives by her self.Marland KitchenMarland KitchenADL independent.Marland KitchenMarland KitchenStill drives.Marland KitchenMarland KitchenNo children but has family around....Marland KitchenMarland KitchenEnd of life issues discussed  Today 05-16-10, again she does not like heroic measures, intubation or CPR.     BP  143/64  Pulse 69  Ht 5\' 4"  (1.626 m)  SpO2 98%  Physical Exam:  Well appearring elderly woman, NAD HEENT: Unremarkable Neck:  No JVD, no thyromegally Lungs:  Clear with no wheezes, rales, or rhonchi.  HEART:  Regular rate rhythm, no murmurs, no rubs, no clicks Abd:  soft, positive bowel sounds, no organomegally, no rebound, no guarding Ext:  2 plus pulses, no edema, no cyanosis, no clubbing Skin:  No rashes no nodules Neuro:  CN II through XII intact, motor grossly intact  DEVICE  Normal device function.  See PaceArt for details.   Assess/Plan:

## 2012-04-16 ENCOUNTER — Encounter: Payer: Self-pay | Admitting: Nurse Practitioner

## 2012-04-16 ENCOUNTER — Non-Acute Institutional Stay (SKILLED_NURSING_FACILITY): Payer: Medicare Other | Admitting: Nurse Practitioner

## 2012-04-16 DIAGNOSIS — K219 Gastro-esophageal reflux disease without esophagitis: Secondary | ICD-10-CM

## 2012-04-16 DIAGNOSIS — M1990 Unspecified osteoarthritis, unspecified site: Secondary | ICD-10-CM

## 2012-04-16 DIAGNOSIS — I10 Essential (primary) hypertension: Secondary | ICD-10-CM

## 2012-04-16 DIAGNOSIS — F0390 Unspecified dementia without behavioral disturbance: Secondary | ICD-10-CM

## 2012-04-28 ENCOUNTER — Encounter: Payer: Self-pay | Admitting: Nurse Practitioner

## 2012-04-28 DIAGNOSIS — F0391 Unspecified dementia with behavioral disturbance: Secondary | ICD-10-CM | POA: Insufficient documentation

## 2012-04-28 NOTE — Assessment & Plan Note (Signed)
Patient is stable; continue current regimen. Will monitor and make changes as necessary.  

## 2012-04-28 NOTE — Progress Notes (Signed)
Patient ID: [name], female   DOB: 1922-09-01, 79 y.o.   MRN: [id number]  Chief Complaint: medical management of chronic conditions  HPI: 79 year old female being seen for routine visit and is currently without any complaints. Nursing is without any concerns at this time.  290.40-DEMENTIA, VASCULAR, UNCOMPLICATED The Alzheimer's disease symptoms have not changed.The patient has agitation.No complications noted from the medication presently being used.takes namenda 10 mg twice daily takes asa 81 mg daily is followed by psych services.  297.9-DELUSION  takes Risperdal 0.5mg  bid since 08/25/11 she is doing well; she has failed being off antipsychotic medication in the past.   401.9-HTN UNSPECIFIED The blood pressure readings taken outside the office since the last visit have been in the target range. No complications noted from the medication presently being used. is taking lisinopril 5 mg daily  564.00-CONSTIPATION The symptoms are stable. Currently the patient is not taking medication for this problem. No other therapies have been tried.takes MiraLax qod.  715.90-OSTEOARTHRITIS The arthritis is stable.No complications noted from the medication presently being used.no complaints of joint pain while taking aleve twice daily and uses biofreeze four times as a day as needed for joint pain.   Review of Systems:  Review of Systems  HENT: Negative for nosebleeds and congestion.   Eyes: Negative for pain.  Respiratory: Negative for cough and shortness of breath.   Cardiovascular: Negative for chest pain, palpitations and leg swelling.  Gastrointestinal: Negative for heartburn, abdominal pain, diarrhea and constipation.  Genitourinary: Negative for dysuria and urgency.  Musculoskeletal: Negative for myalgias.  Skin: Positive for itching (dry itchy skin).  Neurological: Negative for dizziness and tingling.  Psychiatric/Behavioral: Positive for memory loss. Negative for depression. The patient does not  have insomnia.      Medications: Patient's Medications  New Prescriptions   No medications on file  Previous Medications   ASPIRIN EC 81 MG TABLET    Take 81 mg by mouth daily.     CALCIUM CARBONATE-VITAMIN D (CALCIUM-VITAMIN D) 500-200 MG-UNIT PER TABLET    Take 1 tablet by mouth daily.     LISINOPRIL (PRINIVIL,ZESTRIL) 5 MG TABLET    Take 5 mg by mouth daily.   LORAZEPAM (ATIVAN) 2 MG/ML CONCENTRATED SOLUTION    Take 0.5 mg by mouth every 6 (six) hours as needed. For anxiety.   MEMANTINE (NAMENDA) 10 MG TABLET    Take 10 mg by mouth 2 (two) times daily.    MENTHOL, TOPICAL ANALGESIC, (BIOFREEZE) 4 % GEL    Apply 1 application topically 4 (four) times daily as needed. For pain.   NAPROXEN SODIUM (ANAPROX) 220 MG TABLET    Take 220 mg by mouth 2 (two) times daily with a meal.   OLOPATADINE HCL (PATADAY) 0.2 % SOLN    Place 1 drop into both eyes daily.   POLYETHYLENE GLYCOL (MIRALAX / GLYCOLAX) PACKET    Take 17 g by mouth every other day.    VITAMIN B-12 (CYANOCOBALAMIN) 1000 MCG TABLET    Take 1,000 mcg by mouth daily.    Modified Medications   No medications on file  Discontinued Medications   No medications on file     Physical Exam: Physical Exam  Nursing note and vitals reviewed. Constitutional: She is well-developed, well-nourished, and in no distress. No distress.  HENT:  Head: Normocephalic and atraumatic.  Eyes: Conjunctivae and EOM are normal. Pupils are equal, round, and reactive to light.  Neck: Normal range of motion. Neck supple.  Cardiovascular:  Normal rate and regular rhythm.   Pulmonary/Chest: Effort normal and breath sounds normal.  Abdominal: Soft. Bowel sounds are normal.  Musculoskeletal:  MaE  Currently in [facility] and able to self propel throughout facility   Neurological: She is alert.  Skin: Skin is warm and dry. She is not diaphoretic.     Filed Vitals:   04/16/12 1513  BP: 122/66  Pulse: 73  Temp: 98.5 F (36.9 C)  Resp: 18  Height: 5\' 4"  (1.626  m)  Weight: 171 lb (77.565 kg)  SpO2: 97%      Labs reviewed: Basic Metabolic Panel:  Recent Labs  16/10/96 1810  NA 128*  K 4.3  CL 95*  CO2 22  GLUCOSE 94  BUN 21  CREATININE 0.80  CALCIUM 9.7    Liver Function Tests:  Recent Labs  08/27/11 1810  AST 28  ALT 10  ALKPHOS 77  BILITOT 0.5  PROT 7.3  ALBUMIN 3.9    CBC:  Recent Labs  08/27/11 1810  WBC 5.3  NEUTROABS 3.0  HGB 12.1  HCT 34.7*  MCV 85.0  PLT 262   Assessment/Plan HYPERTENSION Patient is stable; continue current regimen. Will monitor and make changes as necessary.   GERD Patient is stable; continue current regimen. Will monitor and make changes as necessary.   OSTEOARTHRITIS Stable on current treatment   Dementia Patient is stable; continue current regimen. Will monitor and make changes as necessary.     Labs/tests ordered Cbc and cmp

## 2012-04-28 NOTE — Assessment & Plan Note (Signed)
Stable on current treatment 

## 2012-06-01 ENCOUNTER — Non-Acute Institutional Stay (SKILLED_NURSING_FACILITY): Payer: Medicare Other | Admitting: Adult Health

## 2012-06-01 ENCOUNTER — Encounter: Payer: Self-pay | Admitting: Adult Health

## 2012-06-01 DIAGNOSIS — F0391 Unspecified dementia with behavioral disturbance: Secondary | ICD-10-CM

## 2012-06-01 DIAGNOSIS — K5900 Constipation, unspecified: Secondary | ICD-10-CM

## 2012-06-01 DIAGNOSIS — I10 Essential (primary) hypertension: Secondary | ICD-10-CM

## 2012-06-01 DIAGNOSIS — F29 Unspecified psychosis not due to a substance or known physiological condition: Secondary | ICD-10-CM

## 2012-06-01 DIAGNOSIS — N390 Urinary tract infection, site not specified: Secondary | ICD-10-CM

## 2012-06-01 NOTE — Progress Notes (Signed)
Patient ID: [name], female   DOB: 04/14/1922, 79 y.o.   MRN: [id number]  [place]  Allergies  Allergen Reactions  . Amlodipine Other (See Comments)    Dizziness & elevated BP  . Hydrocodone Itching  . Penicillins Other (See Comments)    Per [facility]    Chief Complaint  Patient presents with  . Medical Managment of Chronic Issues    HPI  she is being seen for the management of her chronic illnesses. She is delusional has been seen by psych services; and placed on risperdal; however she is not taking the pill formulation. Her urine culture did not grow out a specific organism. When she becomes delusional this a very significant sign for her that she does have an uti present.   Past Medical History  Diagnosis Date  . Hypertension   . Hyperlipidemia   . Cardiomyopathy   . Pacemaker     [employer] [id number]  . PVD (peripheral vascular disease) 2007    stenting b legs 2007,   . Allergic rhinitis   . GERD (gastroesophageal reflux disease)   . Osteoporosis   . Osteoarthritis   . Gallstones     per [place] 11-2008, also no AAA  . CHF (congestive heart failure)   . Pacemaker     Past Surgical History  Procedure Laterality Date  . Pacemaker insertion      permanent   . Breast biopsy benign      Filed Vitals:   06/01/12 1442  BP: 115/67  Pulse: 78  Height: 5\' 4"  (1.626 m)  Weight: 171 lb (77.565 kg)     MEDICATIONS  biofreeze to joints four times daily as needed Ca++ 500/200 daily Lisinopril 5 mg daily miralax every other day namenda 10 mg twice daily Aleve twice daily as needed pataday to both eyes daily  systane balance to both eyes three times daily Asa 81 mg daily    LABS REVIEWED;   04-19-12: wbc 6.2; hgb 10.0; hct 30.0; mcv 87.2; plt 246;glucose 62; bun 23; create 0.85; k+4.5; na++131; liver normal albumin 3.5 05-03-12: urine culture: c koeri/ staph 45,000 colonies 05-28-12: urine culture: mixed   Review of Systems  Unable to perform  ROS   Physical Exam  Constitutional: No distress.  thin  Neck: Neck supple. No JVD present.  Cardiovascular: Normal rate, regular rhythm and intact distal pulses.   Respiratory: Effort normal and breath sounds normal. No respiratory distress. She has no wheezes.  GI: Soft. Bowel sounds are normal. She exhibits no distension. There is no tenderness.  Musculoskeletal: Normal range of motion. She exhibits no edema.  Neurological: She is alert.  Skin: Skin is warm and dry. She is not diaphoretic.      ASSESSMENT/PLAN  1. UTI; staff has been unable to recollect her urine specimen; will being her rocephin 1 gm im daily for 7 days with florastor twice daily for 2 weeks and will monitor her status  2. Hypertension is stable will continue lisinopril 5 mg daily and asa 81 mg daily  3. Dementia: she has had behavioral issues worsening; will continue her namenda 10 mg twice daily and will continue to monitor her status; will need to address her psychosis as well  4. Psychosis: will change her risperdal to liquid 0.5 mg nightly for 3 days then increase to twice daily and will monitor her status   5. Constipation: continue miralax every other day

## 2012-06-02 ENCOUNTER — Other Ambulatory Visit: Payer: Self-pay | Admitting: *Deleted

## 2012-06-02 MED ORDER — LORAZEPAM 2 MG/ML PO CONC: ORAL | Status: DC

## 2012-06-08 ENCOUNTER — Other Ambulatory Visit: Payer: Self-pay | Admitting: Geriatric Medicine

## 2012-06-08 MED ORDER — LORAZEPAM 2 MG/ML PO CONC: ORAL | Status: DC

## 2012-06-09 ENCOUNTER — Inpatient Hospital Stay (HOSPITAL_COMMUNITY)
Admission: EM | Admit: 2012-06-09 | Discharge: 2012-06-11 | DRG: 689 | Disposition: A | Discharge status: Skilled Nursing Facility | Payer: Medicare Other | Attending: Internal Medicine | Admitting: Internal Medicine

## 2012-06-09 ENCOUNTER — Emergency Department (HOSPITAL_COMMUNITY): Payer: Medicare Other

## 2012-06-09 ENCOUNTER — Other Ambulatory Visit: Payer: Self-pay

## 2012-06-09 ENCOUNTER — Encounter (HOSPITAL_COMMUNITY): Payer: Self-pay | Admitting: Emergency Medicine

## 2012-06-09 DIAGNOSIS — I5022 Chronic systolic (congestive) heart failure: Secondary | ICD-10-CM | POA: Diagnosis present

## 2012-06-09 DIAGNOSIS — R4182 Altered mental status, unspecified: Secondary | ICD-10-CM

## 2012-06-09 DIAGNOSIS — I10 Essential (primary) hypertension: Secondary | ICD-10-CM | POA: Diagnosis present

## 2012-06-09 DIAGNOSIS — N390 Urinary tract infection, site not specified: Principal | ICD-10-CM | POA: Diagnosis present

## 2012-06-09 DIAGNOSIS — R079 Chest pain, unspecified: Secondary | ICD-10-CM

## 2012-06-09 DIAGNOSIS — G9349 Other encephalopathy: Secondary | ICD-10-CM | POA: Diagnosis present

## 2012-06-09 DIAGNOSIS — R55 Syncope and collapse: Secondary | ICD-10-CM | POA: Diagnosis present

## 2012-06-09 DIAGNOSIS — Z8679 Personal history of other diseases of the circulatory system: Secondary | ICD-10-CM | POA: Diagnosis present

## 2012-06-09 DIAGNOSIS — N182 Chronic kidney disease, stage 2 (mild): Secondary | ICD-10-CM | POA: Diagnosis present

## 2012-06-09 DIAGNOSIS — R0789 Other chest pain: Secondary | ICD-10-CM | POA: Diagnosis present

## 2012-06-09 DIAGNOSIS — I129 Hypertensive chronic kidney disease with stage 1 through stage 4 chronic kidney disease, or unspecified chronic kidney disease: Secondary | ICD-10-CM | POA: Diagnosis present

## 2012-06-09 DIAGNOSIS — I509 Heart failure, unspecified: Secondary | ICD-10-CM | POA: Diagnosis present

## 2012-06-09 DIAGNOSIS — N179 Acute kidney failure, unspecified: Secondary | ICD-10-CM

## 2012-06-09 DIAGNOSIS — F0391 Unspecified dementia with behavioral disturbance: Secondary | ICD-10-CM | POA: Diagnosis present

## 2012-06-09 DIAGNOSIS — Z950 Presence of cardiac pacemaker: Secondary | ICD-10-CM

## 2012-06-09 DIAGNOSIS — F0390 Unspecified dementia without behavioral disturbance: Secondary | ICD-10-CM | POA: Diagnosis present

## 2012-06-09 DIAGNOSIS — F03918 Unspecified dementia, unspecified severity, with other behavioral disturbance: Secondary | ICD-10-CM | POA: Diagnosis present

## 2012-06-09 DIAGNOSIS — I2589 Other forms of chronic ischemic heart disease: Secondary | ICD-10-CM | POA: Diagnosis present

## 2012-06-09 DIAGNOSIS — I428 Other cardiomyopathies: Secondary | ICD-10-CM | POA: Diagnosis present

## 2012-06-09 LAB — BLOOD GAS, ARTERIAL
Bicarbonate: 19.1 mEq/L — ABNORMAL LOW (ref 20.0–24.0)
Patient temperature: 98.6
TCO2: 17.3 mmol/L (ref 0–100)
pH, Arterial: 7.41 (ref 7.350–7.450)
pO2, Arterial: 94.3 mmHg (ref 80.0–100.0)

## 2012-06-09 LAB — URINE MICROSCOPIC-ADD ON

## 2012-06-09 LAB — URINALYSIS, ROUTINE W REFLEX MICROSCOPIC
Bilirubin Urine: NEGATIVE
Glucose, UA: NEGATIVE mg/dL
Ketones, ur: NEGATIVE mg/dL
Protein, ur: NEGATIVE mg/dL

## 2012-06-09 LAB — BASIC METABOLIC PANEL
BUN: 60 mg/dL — ABNORMAL HIGH (ref 6–23)
Calcium: 9.6 mg/dL (ref 8.4–10.5)
Creatinine, Ser: 1.39 mg/dL — ABNORMAL HIGH (ref 0.50–1.10)
GFR calc non Af Amer: 33 mL/min — ABNORMAL LOW (ref >90)
Glucose, Bld: 100 mg/dL — ABNORMAL HIGH (ref 70–99)
Sodium: 134 mEq/L — ABNORMAL LOW (ref 135–145)

## 2012-06-09 LAB — CBC WITH DIFFERENTIAL/PLATELET
Basophils Absolute: 0.1 10*3/uL (ref 0.0–0.1)
Basophils Relative: 1 % (ref 0–1)
Hemoglobin: 13 g/dL (ref 12.0–15.0)
MCHC: 34.2 g/dL (ref 30.0–36.0)
Monocytes Relative: 8 % (ref 3–12)
Neutro Abs: 5.1 10*3/uL (ref 1.7–7.7)
Neutrophils Relative %: 66 % (ref 43–77)
RDW: 13.8 % (ref 11.5–15.5)

## 2012-06-09 LAB — TROPONIN I: Troponin I: <0.3 ng/mL (ref <0.30)

## 2012-06-09 LAB — PRO B NATRIURETIC PEPTIDE: Pro B Natriuretic peptide (BNP): 506.8 pg/mL — ABNORMAL HIGH (ref 0–450)

## 2012-06-09 MED ORDER — ENOXAPARIN SODIUM 40 MG/0.4ML ~~LOC~~ SOLN
40.0000 mg | Freq: Every day | SUBCUTANEOUS | Status: DC
  Administered 2012-06-10 (×2): 40 mg via SUBCUTANEOUS
  Filled 2012-06-09 (×3): qty 0.4

## 2012-06-09 MED ORDER — CIPROFLOXACIN IN D5W 400 MG/200ML IV SOLN
400.0000 mg | Freq: Once | INTRAVENOUS | Status: AC
  Administered 2012-06-09: 400 mg via INTRAVENOUS
  Filled 2012-06-09: qty 200

## 2012-06-09 MED ORDER — SODIUM CHLORIDE 0.9 % IV SOLN
INTRAVENOUS | Status: DC
  Administered 2012-06-10: 01:00:00 via INTRAVENOUS

## 2012-06-09 MED ORDER — ONDANSETRON HCL 4 MG PO TABS: 4.0000 mg | ORAL_TABLET | Freq: Four times a day (QID) | ORAL | Status: DC | PRN

## 2012-06-09 MED ORDER — SODIUM CHLORIDE 0.9 % IV BOLUS (SEPSIS)
1000.0000 mL | Freq: Once | INTRAVENOUS | Status: AC
  Administered 2012-06-09: 1000 mL via INTRAVENOUS

## 2012-06-09 MED ORDER — ACETAMINOPHEN 650 MG RE SUPP: 650.0000 mg | Freq: Four times a day (QID) | RECTAL | Status: DC | PRN

## 2012-06-09 MED ORDER — SODIUM CHLORIDE 0.9 % IJ SOLN
3.0000 mL | Freq: Two times a day (BID) | INTRAMUSCULAR | Status: DC
  Administered 2012-06-10 – 2012-06-11 (×2): 3 mL via INTRAVENOUS

## 2012-06-09 MED ORDER — LEVALBUTEROL HCL 0.63 MG/3ML IN NEBU: 0.6300 mg | INHALATION_SOLUTION | Freq: Four times a day (QID) | RESPIRATORY_TRACT | Status: DC | PRN

## 2012-06-09 MED ORDER — ACETAMINOPHEN 325 MG PO TABS: 650.0000 mg | ORAL_TABLET | Freq: Four times a day (QID) | ORAL | Status: DC | PRN

## 2012-06-09 MED ORDER — ONDANSETRON HCL 4 MG/2ML IJ SOLN: 4.0000 mg | Freq: Four times a day (QID) | INTRAMUSCULAR | Status: DC | PRN

## 2012-06-09 MED ORDER — DEXTROSE 5 % IV SOLN: 1.0000 g | Freq: Once | INTRAVENOUS | Status: DC

## 2012-06-09 NOTE — ED Notes (Signed)
Family at bedside states patient had been A&O until about 2 weeks ago. Patient was seen and treated for UTI. Family states patient has not returned to baseline since that time.

## 2012-06-09 NOTE — ED Notes (Signed)
Per EMS patient from [facility]. Patient has had a 3 week spell of delusions. Patient has been "speaking in tongues". At noon an EKG was done (normal paced- available for review), at 1900 EMS was called for a c/o chest pain. Patient denies chest pain. On paper work from facility it states reason for evaluation to be "subacute delirium".

## 2012-06-09 NOTE — ED Notes (Addendum)
Patient speaking erratically, singing and praying. Difficult to obtain information from.

## 2012-06-09 NOTE — ED Notes (Signed)
Below swallow screen done at 2030. Late Entry.

## 2012-06-09 NOTE — ED Provider Notes (Signed)
History     CSN: [id number]  Arrival date & time 06/09/12  1927   First MD Initiated Contact with Patient 06/09/12 2008      Chief Complaint  Patient presents with  . Chest Pain    patient denies    (Consider location/radiation/quality/duration/timing/severity/associated sxs/prior treatment) Patient is a 79 y.o. female presenting with chest pain.  Chest Pain  Level 5 caveat due to AMS  Pt with multiple medical problems sent from SNF for evaluation of "subacute delirium" and "abnormal EKG" She has history of both, has a pacemaker in place now. By their report has had mental status changes for several weeks, characterized by incoherent and hyper-religious speech. Pt apparently complained of chest pain earlier today as well, EKG from SNF shows pacemaker but per the provider note "concerning for MI". She denied CP to nurse, but reported CP earlier today that was 'tight'. No SOB, diaphoresis. Had some nausea.    Past Medical History  Diagnosis Date  . Hypertension   . Hyperlipidemia   . Cardiomyopathy   . Pacemaker     [employer] [id number]  . PVD (peripheral vascular disease) 2007    stenting b legs 2007,   . Allergic rhinitis   . GERD (gastroesophageal reflux disease)   . Osteoporosis   . Osteoarthritis   . Gallstones     per [place] 11-2008, also no AAA  . CHF (congestive heart failure)   . Pacemaker     Past Surgical History  Procedure Laterality Date  . Pacemaker insertion      permanent   . Breast biopsy benign      Family History  Problem Relation Age of Onset  . Hypertension    . Breast cancer Neg Hx   . Colon cancer Neg Hx     History  Substance Use Topics  . Smoking status: Former Games developer  . Smokeless tobacco: Not on file  . Alcohol Use: No    OB History   Grav Para Term Preterm Abortions TAB SAB Ect Mult Living                  Review of Systems  Cardiovascular: Positive for chest pain.   Unable to assess due to mental status.    Allergies  Amlodipine; Hydrocodone; and Penicillins  Home Medications   Current Outpatient Rx  Name  Route  Sig  Dispense  Refill  . aspirin EC 81 MG tablet   Oral   Take 81 mg by mouth daily.           . Calcium Carbonate-Vitamin D (CALCIUM-VITAMIN D) 500-200 MG-UNIT per tablet   Oral   Take 1 tablet by mouth daily.           Marland Kitchen lisinopril (PRINIVIL,ZESTRIL) 5 MG tablet   Oral   Take 5 mg by mouth daily.         Marland Kitchen LORazepam (ATIVAN) 2 MG/ML concentrated solution      Inject 1 MG intramuscularly every hour for anxiety and fighting. Give 1 MG intramuscularly now and if it does not work in a hour give 1 MG more.   120 mL   5   . memantine (NAMENDA) 10 MG tablet   Oral   Take 10 mg by mouth 2 (two) times daily.          . Menthol, Topical Analgesic, (BIOFREEZE) 4 % GEL   Topical   Apply 1 application topically 4 (four) times daily as needed. For pain.         Marland Kitchen  naproxen sodium (ANAPROX) 220 MG tablet   Oral   Take 220 mg by mouth 2 (two) times daily with a meal.         . Olopatadine HCl (PATADAY) 0.2 % SOLN   Both Eyes   Place 1 drop into both eyes daily.         . polyethylene glycol (MIRALAX / GLYCOLAX) packet   Oral   Take 17 g by mouth every other day.          . vitamin B-12 (CYANOCOBALAMIN) 1000 MCG tablet   Oral   Take 1,000 mcg by mouth daily.             BP 156/56  Pulse 56  Temp(Src) 99.3 F (37.4 C) (Rectal)  Resp 20  SpO2 100%  Physical Exam  Nursing note and vitals reviewed. Constitutional: She appears well-developed and well-nourished.  HENT:  Head: Normocephalic and atraumatic.  Eyes: EOM are normal. Pupils are equal, round, and reactive to light.  Neck: Normal range of motion. Neck supple.  Cardiovascular: Normal rate, normal heart sounds and intact distal pulses.   Pulmonary/Chest: Effort normal and breath sounds normal.  Abdominal: Bowel sounds are normal. She exhibits no distension. There is no tenderness.   Musculoskeletal: Normal range of motion. She exhibits no edema and no tenderness.  Neurological: She is alert. She has normal strength. No cranial nerve deficit or sensory deficit.  Skin: Skin is warm and dry. No rash noted.  Psychiatric:  Labile mood, intermittently coherent, yelling out frequently with religious tones    ED Course  Procedures (including critical care time)  Labs Reviewed  CBC WITH DIFFERENTIAL - Abnormal; Notable for the following:    Eosinophils Relative 11 (*)    Eosinophils Absolute 0.9 (*)    All other components within normal limits  BASIC METABOLIC PANEL - Abnormal; Notable for the following:    Sodium 134 (*)    Glucose, Bld 100 (*)    BUN 60 (*)    Creatinine, Ser 1.39 (*)    GFR calc non Af Amer 33 (*)    GFR calc Af Amer 38 (*)    All other components within normal limits  URINALYSIS, ROUTINE W REFLEX MICROSCOPIC - Abnormal; Notable for the following:    APPearance CLOUDY (*)    Hgb urine dipstick MODERATE (*)    Leukocytes, UA MODERATE (*)    All other components within normal limits  URINE MICROSCOPIC-ADD ON - Abnormal; Notable for the following:    Squamous Epithelial / LPF FEW (*)    Bacteria, UA FEW (*)    All other components within normal limits  URINE CULTURE  GLUCOSE, CAPILLARY  TROPONIN I   Dg Chest 2 View  06/09/2012   *RADIOLOGY REPORT*  Clinical Data: Chest pain  CHEST - 2 VIEW  Comparison: Prior chest x-ray 08/27/2011  Findings: Stable position of left subclavian approach cardiac rhythm maintenance device with leads project in the right atrium and right ventricle.  Mild cardiomegaly is similar to prior. Inspiratory volumes are low and there is mild bibasilar atelectasis.  No edema or focal airspace consolidation. Degenerative changes and both glenohumeral joints.  No acute osseous abnormality.  IMPRESSION: No acute cardiopulmonary disease   Original Report Authenticated By: Malachy Moan, M.D.   Ct Head Wo Contrast  06/09/2012    *RADIOLOGY REPORT*  Clinical Data: 3 weeks of delusions; subacute delirium.  CT HEAD WITHOUT CONTRAST  Technique:  Contiguous axial images were obtained from the  base of the skull through the vertex without contrast.  Comparison: CT of the head performed 08/27/2011  Findings: There is no evidence of acute infarction, mass lesion, or intra- or extra-axial hemorrhage on CT.  Prominence of the ventricles and sulci reflects moderate cortical volume loss.  Diffuse periventricular and subcortical white matter change likely reflects small vessel ischemic microangiopathy.  Mild cerebellar atrophy is noted.  The brainstem and fourth ventricle are within normal limits.  The basal ganglia are unremarkable in appearance.  The cerebral hemispheres demonstrate grossly normal gray-white differentiation. No mass effect or midline shift is seen.  There is no evidence of fracture; visualized osseous structures are unremarkable in appearance.  The orbits are within normal limits. The paranasal sinuses and mastoid air cells are well-aerated.  No significant soft tissue abnormalities are seen.  IMPRESSION:  1.  No acute intracranial pathology seen on CT. 2.  Moderate cortical volume loss and diffuse small vessel ischemic microangiopathy.   Original Report Authenticated By: Tonia Ghent, M.D.     1. Altered mental state   2. UTI (urinary tract infection)   3. Chest pain   4. Acute renal failure       MDM   Date: 06/09/2012  Rate: 84  Rhythm: V-paced  QRS Axis: indeterminate  Intervals: paced  ST/T Wave abnormalities: indeterminate  Conduction Disutrbances:V paced  Narrative Interpretation:   Old EKG Reviewed: unchanged   10:44 PM Labs and imaging reviewed. No acute findings on CT. Neg Trop and baseline paced EKG. Family at bedside now, states she has not been acting herself for several weeks despite treatment for UTI with Rocephin at SNF. She has UA concerning for continued UTI and Cr above baseline. Family also  reports seeing episodes of unresponsiveness off and on for the last several days, lasts for seconds to minutes. No seizure-like activity but seems more confused immediately afterwards. Will admit for rule out, hydration, treatment of UTI and further evaluation of AMS. Discussed with Dr. Susie Cassette.        Charles B. Bernette Mayers, MD 06/09/12 2249

## 2012-06-09 NOTE — ED Notes (Signed)
RT notified of pending ABG 

## 2012-06-09 NOTE — H&P (Signed)
[facility] History and Physical  [name] ZOX:[id number] DOB: 08-03-1922 DOA: 06/09/2012  Referring physician:  PCP: Bufford Spikes, DO   Chief Complaint:   HPI:  80 y.o. female who presents for evaluation of chest pain. She has a history of hypertension, hyperlipidemia, presumed ischemic cardiomyopathy, PAD, status post stenting to bilateral lower extremities and 2007, bradycardia, status post pacemaker. In the past, she has had an EF of 45% with apparent wall motion abnormalities on echocardiogram. Last echocardiogram 3/09: EF 60%, mild LVH, diastolic dysfunction,   Cardiolite scan 11/02: No ischemia or scar, EF 45%. Carotid Doppler 7/11:1-39% bilateral. She was last seen by Dr. Excell Seltzer 9/26.   Per EMS patient from [facility]. Patient has had a 3 week spell of CONFUSION,RECENTLY RX FOR UTI with rocephine , Patient has been "speaking in tongues". At noon an EKG was done (normal paced- available for review), at 1900 EMS was called for a c/o chest pain. Patient denies chest pain currently .Neg Trop and baseline paced EKG.Family also reports seeing episodes of unresponsiveness off and on for the last several days, lasts for seconds to minutes. No seizure-like activity but seems more confused immediately afterwards. Will admit for rule out, hydration, treatment of UTI and further evaluation of AMS.          Review of Systems: negative for the following  Constitutional: Denies fever, chills, diaphoresis, appetite change and fatigue.  HEENT: Denies photophobia, eye pain, redness, hearing loss, ear pain, congestion, sore throat, rhinorrhea, sneezing, mouth sores, trouble swallowing, neck pain, neck stiffness and tinnitus.  Respiratory: Denies SOB, DOE, cough, chest tightness, and wheezing.  Cardiovascular: Positive for chest pain. Gastrointestinal: Denies nausea, vomiting, abdominal pain, diarrhea, constipation, blood in stool and abdominal distention.  Genitourinary: Denies dysuria,  urgency, frequency, hematuria, flank pain and difficulty urinating.  Musculoskeletal: Denies myalgias, back pain, joint swelling, arthralgias and gait problem.  Skin: Denies pallor, rash and wound.  Neurological: Denies dizziness, seizures, syncope, weakness, light-headedness, numbness and headaches.  Hematological: Denies adenopathy. Easy bruising, personal or family bleeding history  Psychiatric/Behavioral: Denies suicidal ideation, mood changes, confusion, nervousness, sleep disturbance and agitation       Past Medical History  Diagnosis Date  . Hypertension   . Hyperlipidemia   . Cardiomyopathy   . Pacemaker     [place] [employer] [id number]  . PVD (peripheral vascular disease) 2007    stenting b legs 2007,   . Allergic rhinitis   . GERD (gastroesophageal reflux disease)   . Osteoporosis   . Osteoarthritis   . Gallstones     per [place] 11-2008, also no AAA  . CHF (congestive heart failure)   . Pacemaker      Past Surgical History  Procedure Laterality Date  . Pacemaker insertion      permanent   . Breast biopsy benign        Social History:  reports that she has quit smoking. She does not have any smokeless tobacco history on file. She reports that she does not drink alcohol. Her drug history is not on file.    Allergies  Allergen Reactions  . Amlodipine Other (See Comments)    Dizziness & elevated BP  . Hydrocodone Itching  . Penicillins Other (See Comments)    Per MAR    Family History  Problem Relation Age of Onset  . Hypertension    . Breast cancer Neg Hx   . Colon cancer Neg Hx      Prior to Admission medications  Medication Sig Start Date End Date Taking? Authorizing Provider  aspirin EC 81 MG tablet Take 81 mg by mouth daily.     Yes Historical Provider, MD  Calcium Carbonate-Vitamin D (CALCIUM-VITAMIN D) 500-200 MG-UNIT per tablet Take 1 tablet by mouth daily.     Yes Historical Provider, MD  lisinopril (PRINIVIL,ZESTRIL) 5 MG tablet Take 5 mg  by mouth daily.   Yes Historical Provider, MD  LORazepam (ATIVAN) 2 MG/ML injection Inject 0.5 mg into the vein every 12 (twelve) hours as needed for anxiety.   Yes Historical Provider, MD  memantine (NAMENDA) 10 MG tablet Take 10 mg by mouth 2 (two) times daily.    Yes Historical Provider, MD  Menthol, Topical Analgesic, (BIOFREEZE) 4 % GEL Apply 1 application topically 4 (four) times daily as needed. For pain.   Yes Historical Provider, MD  naproxen sodium (ANAPROX) 220 MG tablet Take 220 mg by mouth 2 (two) times daily as needed (for pain).    Yes Historical Provider, MD  Olopatadine HCl (PATADAY) 0.2 % SOLN Place 1 drop into both eyes daily.   Yes Historical Provider, MD  polyethylene glycol (MIRALAX / GLYCOLAX) packet Take 17 g by mouth every other day.    Yes Historical Provider, MD  Propylene Glycol 0.6 % SOLN Place 1 drop into both eyes 3 (three) times daily.   Yes Historical Provider, MD  risperiDONE (RISPERDAL) 1 MG/ML oral solution Take 0.5 mg by mouth 2 (two) times daily.   Yes Historical Provider, MD  saccharomyces boulardii (FLORASTOR) 250 MG capsule Take 250 mg by mouth 2 (two) times daily. 14 day course of therapy started 06/01/12.   Yes Historical Provider, MD  vitamin B-12 (CYANOCOBALAMIN) 1000 MCG tablet Take 1,000 mcg by mouth daily.     Yes Historical Provider, MD  cefTRIAXone (ROCEPHIN) 1 G injection Inject 1 g into the muscle daily.    Historical Provider, MD     Physical Exam: Filed Vitals:   06/09/12 2030 06/09/12 2130 06/09/12 2145 06/09/12 2205  BP: 163/72 147/72 161/75 137/55  Pulse:   80   Temp:      TempSrc:      Resp: 22 19 15 15   SpO2:   98% 98%     Constitutional: Vital signs reviewed. Patient is a well-developed and well-nourished in no acute distress and cooperative with exam. Confused Head: Normocephalic and atraumatic  Ear: TM normal bilaterally  Mouth: no erythema or exudates, MMM  Eyes: PERRL, EOMI, conjunctivae normal, No scleral icterus.  Neck:  Supple, Trachea midline normal ROM, No JVD, mass, thyromegaly, or carotid bruit present.  Cardiovascular: RRR, S1 normal, S2 normal, no MRG, pulses symmetric and intact bilaterally  Pulmonary/Chest: CTAB, no wheezes, rales, or rhonchi  Abdominal: Soft. Non-tender, non-distended, bowel sounds are normal, no masses, organomegaly, or guarding present.  GU: no CVA tenderness Musculoskeletal: No joint deformities, erythema, or stiffness, ROM full and no nontender Ext: no edema and no cyanosis, pulses palpable bilaterally (DP and PT)  Hematology: no cervical, inginal, or axillary adenopathy.  Neurological: A&O x3, Strenght is normal and symmetric bilaterally, cranial nerve II-XII are grossly intact, no focal motor deficit, sensory intact to light touch bilaterally.  Skin: Warm, dry and intact. No rash, cyanosis, or clubbing.  Psychiatric: Labile mood, intermittently coherent, yelling out frequently with religious tones        Labs on Admission:    Basic Metabolic Panel:  Recent Labs Lab 06/09/12 2135  NA 134*  K 5.0  CL 98  CO2 20  GLUCOSE 100*  BUN 60*  CREATININE 1.39*  CALCIUM 9.6   Liver Function Tests: No results found for this basename: AST, ALT, ALKPHOS, BILITOT, PROT, ALBUMIN,  in the last 168 hours No results found for this basename: LIPASE, AMYLASE,  in the last 168 hours No results found for this basename: AMMONIA,  in the last 168 hours CBC:  Recent Labs Lab 06/09/12 2135  WBC 7.8  NEUTROABS 5.1  HGB 13.0  HCT 38.0  MCV 85.4  PLT 210   Cardiac Enzymes:  Recent Labs Lab 06/09/12 2135  TROPONINI <0.30    BNP (last 3 results) No results found for this basename: PROBNP,  in the last 8760 hours    CBG:  Recent Labs Lab 06/09/12 1938  GLUCAP 97    Radiological Exams on Admission: Dg Chest 2 View  06/09/2012   *RADIOLOGY REPORT*  Clinical Data: Chest pain  CHEST - 2 VIEW  Comparison: Prior chest x-ray 08/27/2011  Findings: Stable position of  left subclavian approach cardiac rhythm maintenance device with leads project in the right atrium and right ventricle.  Mild cardiomegaly is similar to prior. Inspiratory volumes are low and there is mild bibasilar atelectasis.  No edema or focal airspace consolidation. Degenerative changes and both glenohumeral joints.  No acute osseous abnormality.  IMPRESSION: No acute cardiopulmonary disease   Original Report Authenticated By: Malachy Moan, M.D.   Ct Head Wo Contrast  06/09/2012   *RADIOLOGY REPORT*  Clinical Data: 3 weeks of delusions; subacute delirium.  CT HEAD WITHOUT CONTRAST  Technique:  Contiguous axial images were obtained from the base of the skull through the vertex without contrast.  Comparison: CT of the head performed 08/27/2011  Findings: There is no evidence of acute infarction, mass lesion, or intra- or extra-axial hemorrhage on CT.  Prominence of the ventricles and sulci reflects moderate cortical volume loss.  Diffuse periventricular and subcortical white matter change likely reflects small vessel ischemic microangiopathy.  Mild cerebellar atrophy is noted.  The brainstem and fourth ventricle are within normal limits.  The basal ganglia are unremarkable in appearance.  The cerebral hemispheres demonstrate grossly normal gray-white differentiation. No mass effect or midline shift is seen.  There is no evidence of fracture; visualized osseous structures are unremarkable in appearance.  The orbits are within normal limits. The paranasal sinuses and mastoid air cells are well-aerated.  No significant soft tissue abnormalities are seen.  IMPRESSION:  1.  No acute intracranial pathology seen on CT. 2.  Moderate cortical volume loss and diffuse small vessel ischemic microangiopathy.   Original Report Authenticated By: Tonia Ghent, M.D.    EKG: Independently reviewed. Date: 06/09/2012  Rate: 84  Rhythm: V-paced  QRS Axis: indeterminate  Intervals: paced  ST/T Wave abnormalities:  indeterminate  Conduction Disutrbances:V paced  Narrative Interpretation:  Old EKG Reviewed: unchanged   Assessment/Plan Principal Problem:   Altered mental state Active Problems:   HYPERTENSION   CARDIOMYOPATHY   Dementia   Altered mental status Could be secondary to urinary tract infection Cannot rule out CVA as confusion persist despite treatment of UTI CT head negative MRI cannot be done because of a pacemaker We'll obtain an ABG History of vitamin B 12 deficiency and dementia Check a TSH If no improvement consider EEG  Acute renal failure secondary to dehydration and poor oral intake Hold ACE inhibitor Hydration  Chest pain EKG nondiagnostic We'll cycle cardiac enzymes Obtain a 2-D echo Start the patient on aspirin rectally N.p.o. for  now pending speech therapy evaluation  UTI Status post his treatment with Rocephin Continue risperidone    Code Status:   full Family Communication: bedside Disposition Plan: admit   Time spent: 70 mins   [facility] Pager [phone]  If 7PM-7AM, please contact night-coverage [email or web address] Password [facility] 06/09/2012, 11:03 PM

## 2012-06-09 NOTE — ED Notes (Signed)
IV team at bedside. I was requested to bedside. Patient had been singing when IV team entered room. Patient became "nonresponsive", Patient staring straight ahead, with mouth open. Patient spontaneously aroused. MD to bedside as patient aroused.

## 2012-06-09 NOTE — ED Notes (Signed)
ZOX:[id number]<AV> Expected date:<BR> Expected time:<BR> Means of arrival:<BR> Comments:<BR> EMS, 40F from [facility]; ? Syncope

## 2012-06-10 ENCOUNTER — Encounter (HOSPITAL_COMMUNITY): Payer: Self-pay | Admitting: *Deleted

## 2012-06-10 DIAGNOSIS — N179 Acute kidney failure, unspecified: Secondary | ICD-10-CM

## 2012-06-10 DIAGNOSIS — R079 Chest pain, unspecified: Secondary | ICD-10-CM

## 2012-06-10 DIAGNOSIS — I369 Nonrheumatic tricuspid valve disorder, unspecified: Secondary | ICD-10-CM

## 2012-06-10 DIAGNOSIS — N390 Urinary tract infection, site not specified: Principal | ICD-10-CM

## 2012-06-10 LAB — TROPONIN I: Troponin I: <0.3 ng/mL (ref <0.30)

## 2012-06-10 LAB — BASIC METABOLIC PANEL
CO2: 22 mEq/L (ref 19–32)
Chloride: 101 mEq/L (ref 96–112)
GFR calc Af Amer: 47 mL/min — ABNORMAL LOW (ref >90)
Potassium: 4.7 mEq/L (ref 3.5–5.1)
Sodium: 134 mEq/L — ABNORMAL LOW (ref 135–145)

## 2012-06-10 LAB — CBC
MCV: 85.9 fL (ref 78.0–100.0)
Platelets: 210 10*3/uL (ref 150–400)
RBC: 4.19 MIL/uL (ref 3.87–5.11)
WBC: 8.4 10*3/uL (ref 4.0–10.5)

## 2012-06-10 LAB — MRSA PCR SCREENING: MRSA by PCR: NEGATIVE

## 2012-06-10 MED ORDER — CIPROFLOXACIN IN D5W 400 MG/200ML IV SOLN
400.0000 mg | Freq: Every day | INTRAVENOUS | Status: DC
  Administered 2012-06-10: 400 mg via INTRAVENOUS
  Filled 2012-06-10 (×2): qty 200

## 2012-06-10 MED ORDER — VITAMIN B-12 1000 MCG PO TABS
1000.0000 ug | ORAL_TABLET | Freq: Every day | ORAL | Status: DC
  Administered 2012-06-10 – 2012-06-11 (×2): 1000 ug via ORAL
  Filled 2012-06-10 (×2): qty 1

## 2012-06-10 MED ORDER — HYDRALAZINE HCL 20 MG/ML IJ SOLN
5.0000 mg | INTRAMUSCULAR | Status: DC | PRN
  Administered 2012-06-10: 5 mg via INTRAVENOUS
  Filled 2012-06-10: qty 1

## 2012-06-10 MED ORDER — OLOPATADINE HCL 0.1 % OP SOLN
1.0000 [drp] | Freq: Every day | OPHTHALMIC | Status: DC
  Administered 2012-06-10 – 2012-06-11 (×2): 1 [drp] via OPHTHALMIC
  Filled 2012-06-10: qty 5

## 2012-06-10 MED ORDER — SACCHAROMYCES BOULARDII 250 MG PO CAPS
250.0000 mg | ORAL_CAPSULE | Freq: Two times a day (BID) | ORAL | Status: DC
  Administered 2012-06-10 – 2012-06-11 (×3): 250 mg via ORAL
  Filled 2012-06-10 (×5): qty 1

## 2012-06-10 MED ORDER — RISPERIDONE 1 MG/ML PO SOLN
0.5000 mg | Freq: Two times a day (BID) | ORAL | Status: DC
  Administered 2012-06-10 – 2012-06-11 (×3): 0.5 mg via ORAL
  Filled 2012-06-10 (×4): qty 0.5

## 2012-06-10 MED ORDER — MEMANTINE HCL 10 MG PO TABS
10.0000 mg | ORAL_TABLET | Freq: Two times a day (BID) | ORAL | Status: DC
  Administered 2012-06-10 – 2012-06-11 (×3): 10 mg via ORAL
  Filled 2012-06-10 (×5): qty 1

## 2012-06-10 MED ORDER — SODIUM CHLORIDE 0.9 % IV SOLN: INTRAVENOUS | Status: DC

## 2012-06-10 NOTE — Progress Notes (Signed)
OT Cancellation Note  Patient Details Name: [name] MRN: [id number] DOB: 05-25-1922   Cancelled Treatment:      Pt is a long term resident at [place], and she plans to return there.  Will defer OT eval/screen to that venue for any change in function.     Nahiara Kretzschmar 06/10/2012, 3:34 PM Marica Otter, OTR/L [phone] 06/10/2012

## 2012-06-10 NOTE — Progress Notes (Signed)
Clinical Social Work Department BRIEF PSYCHOSOCIAL ASSESSMENT 06/10/2012  Patient:  [name], [name]     Account Number:  [id number]     Admit date:  06/09/2012  Clinical Social Worker:  Orpah Greek  Date/Time:  06/10/2012 12:32 PM  Referred by:  Physician  Date Referred:  06/10/2012 Referred for  SNF Placement   Other Referral:   Interview type:  Patient Other interview type:   and relative at bedside    PSYCHOSOCIAL DATA Living Status:  FACILITY Admitted from facility:  [facility] Level of care:  Skilled Nursing Facility Primary support name:  [name] (niece) h#: [phone] c#: [phone] Primary support relationship to patient:  FAMILY Degree of support available:   good    CURRENT CONCERNS Current Concerns  Post-Acute Placement   Other Concerns:    SOCIAL WORK ASSESSMENT / PLAN CSW spoke with patient & relative at bedside re: discharge planning. Patient was admitted from [facility] and plans to return there at discharge.   Assessment/plan status:  Information/Referral to [facility] Other assessment/ plan:   Information/referral to community resources:   CSW completed FL2 and faxed information to [facility] - confirmed with Tammy that they would be able to take patient back when stable.    PATIENT'S/FAMILY'S RESPONSE TO PLAN OF CARE: Patient and family member at bedside both agree with returning to [facility], they have been pleased with the facility.       Unice Bailey, LCSW [facility] Clinical Social Worker cell #: [phone]

## 2012-06-10 NOTE — Evaluation (Signed)
Physical Therapy One Time Evaluation and D/C from acute PT Patient Details Name: [name] MRN: [id number] DOB: 03/11/1922 Today's Date: 06/10/2012 Time: 4098-1191 PT Time Calculation (min): 14 min  PT Assessment / Plan / Recommendation Clinical Impression  Pt is an 79 year old female admitted for chest pain and encephalopathy.  Pt lethargic upon entering room however open/closed eyes.  Nsg tech in to take lying and sitting BPs.  Pt more awake and alert upon sitting and able to state that she is from SNF and usually stays in bed however sometimes gets in w/c.  Pt did not verbalize how she transferred to w/c even with prompting.  Pt appears at baseline so will defer further PT needs to SNF as she has been a resident for years (per pt).    PT Assessment  Patent does not need any further PT services    Follow Up Recommendations  SNF    Does the patient have the potential to tolerate intense rehabilitation      Barriers to Discharge        Equipment Recommendations  None recommended by PT    Recommendations for Other Services     Frequency      Precautions / Restrictions     Pertinent Vitals/Pain BP per nsg tech in doc flowsheets (did not drop with positional change supine to sit)      Mobility  Bed Mobility Bed Mobility: Supine to Sit;Sit to Supine Supine to Sit: 1: +2 Total assist Supine to Sit: Patient Percentage: 10% Sit to Supine: 1: +2 Total assist Sit to Supine: Patient Percentage: 10% Details for Bed Mobility Assistance: pt attempted to assist however physically unable Transfers Transfers: Not assessed (pt states she doesn't stand)    Exercises     PT Diagnosis:    PT Problem List:   PT Treatment Interventions:     PT Goals    Visit Information  Last PT Received On: 06/10/12 Assistance Needed: +2    Subjective Data  Subjective: Aren't you cute   Prior Functioning  Home Living Available Help at Discharge: Skilled Nursing Facility Type of Home:  Skilled Nursing Facility Prior Function Level of Independence: Needs assistance Comments: pt reports she does not usually get OOB however sometimes sits in w/c Communication Communication: No difficulties    Cognition  Cognition Arousal/Alertness: Awake/alert Behavior During Therapy: WFL for tasks assessed/performed Overall Cognitive Status: History of cognitive impairments - at baseline    Extremity/Trunk Assessment Right Lower Extremity Assessment RLE ROM/Strength/Tone: Deficits RLE ROM/Strength/Tone Deficits: assist required against gravity for mobility Left Lower Extremity Assessment LLE ROM/Strength/Tone: Deficits LLE ROM/Strength/Tone Deficits: assist required against gravity for mobility   Balance Balance Balance Assessed: Yes Static Sitting Balance Static Sitting - Balance Support: Bilateral upper extremity supported;Feet supported Static Sitting - Level of Assistance: 4: Min assist Static Sitting - Comment/# of Minutes: assist for trunk control, more assist required with any balance challenge.  End of Session PT - End of Session Activity Tolerance: Patient tolerated treatment well Patient left: in bed;with call bell/phone within reach;with bed alarm set  GP     Cam Harnden,KATHrine E 06/10/2012, 4:23 PM Zenovia Jarred, PT, DPT 06/10/2012 Pager: [phone]

## 2012-06-10 NOTE — Progress Notes (Signed)
Echocardiogram 2D Echocardiogram has been performed.  [name] 06/10/2012, 9:47 AM

## 2012-06-10 NOTE — Evaluation (Signed)
Clinical/Bedside Swallow Evaluation Patient Details  Name: [name] MRN: [id number] Date of Birth: 1922/03/15  Today's Date: 06/10/2012 Time: 0930-1014 SLP Time Calculation (min): 44 min  Past Medical History:  Past Medical History  Diagnosis Date  . Hypertension   . Hyperlipidemia   . Cardiomyopathy   . Pacemaker     [place] [id number]  . PVD (peripheral vascular disease) 2007    stenting b legs 2007,   . Allergic rhinitis   . GERD (gastroesophageal reflux disease)   . Osteoporosis   . Osteoarthritis   . Gallstones     per [place] 11-2008, also no AAA  . CHF (congestive heart failure)   . Pacemaker    Past Surgical History:  Past Surgical History  Procedure Laterality Date  . Pacemaker insertion      permanent   . Breast biopsy benign     HPI:  79 y.o. female who presents for evaluation of chest pain. She has a history of hypertension, hyperlipidemia, presumed ischemic cardiomyopathy, PAD, status post stenting to bilateral lower extremities and 2007, bradycardia, status post pacemaker. In the past, she has had an EF of 45% with apparent wall motion abnormalities on echocardiogram. Last echocardiogram 3/09: EF 60%, mild LVH, diastolic dysfunction,   Cardiolite scan 11/02: No ischemia or scar, EF 45%. Carotid Doppler 7/11:1-39% bilateral. She was last seen by Dr. Excell Seltzer 9/26.     Assessment / Plan / Recommendation Clinical Impression  Pt. appears to have a mild oropharyngeal dysphagia characterized by piecemeal swallowing with purees, and multiple swallows with all consistencies.  No cough or throat clearing was observed during this eval, however, pt. reports coughing frequently with water.  When questioned, pt, agreed this happens when she drinks water with a straw.  She appears to swallow thin liquids via cup and small sips, without overt s/s of aspiration. LS are clear and pt. is afebrile.  SLP will monitor and adjust diet if s/s of aspiration occur despite new  precautions.    Aspiration Risk  Mild    Diet Recommendation Dysphagia 3 (Mechanical Soft);Thin liquid (No straws)   Liquid Administration via: Cup;No straw Medication Administration: Whole meds with puree Supervision: Patient able to self feed;Full supervision/cueing for compensatory strategies Compensations: Slow rate;Small sips/bites Postural Changes and/or Swallow Maneuvers: Seated upright 90 degrees    Other  Recommendations Oral Care Recommendations: Oral care BID;Staff/trained caregiver to provide oral care Other Recommendations: Clarify dietary restrictions   Follow Up Recommendations  Skilled Nursing facility    Frequency and Duration min 2x/week  2 weeks   Pertinent Vitals/Pain n/a    SLP Swallow Goals Patient will consume recommended diet without observed clinical signs of aspiration with: Supervision/safety;Set-up;Minimal assistance Patient will utilize recommended strategies during swallow to increase swallowing safety with: Supervision/safety;Minimal assistance   Swallow Study Prior Functional Status       General HPI: 79 y.o. female who presents for evaluation of chest pain. She has a history of hypertension, hyperlipidemia, presumed ischemic cardiomyopathy, PAD, status post stenting to bilateral lower extremities and 2007, bradycardia, status post pacemaker. In the past, she has had an EF of 45% with apparent wall motion abnormalities on echocardiogram. Last echocardiogram 3/09: EF 60%, mild LVH, diastolic dysfunction,   Cardiolite scan 11/02: No ischemia or scar, EF 45%. Carotid Doppler 7/11:1-39% bilateral. She was last seen by Dr. Excell Seltzer 9/26.   Type of Study: Bedside swallow evaluation Previous Swallow Assessment: None in [facility] system Diet Prior to this Study: NPO;Other (Comment) (Reg,  thin prior to admit) Temperature Spikes Noted: No Respiratory Status: Room air History of Recent Intubation: No Behavior/Cognition: Alert;Cooperative;Pleasant mood Oral  Cavity - Dentition: Dentures, top;Dentures, bottom Self-Feeding Abilities: Needs set up Patient Positioning: Upright in bed Baseline Vocal Quality: Hoarse;Low vocal intensity Volitional Cough: Weak Volitional Swallow: Able to elicit    Oral/Motor/Sensory Function Overall Oral Motor/Sensory Function: Appears within functional limits for tasks assessed   Ice Chips Ice chips: Not tested   Thin Liquid Thin Liquid: Impaired Presentation: Spoon;Cup Pharyngeal  Phase Impairments: Multiple swallows    Nectar Thick Nectar Thick Liquid: Impaired Presentation: Spoon;Cup Pharyngeal Phase Impairments: Multiple swallows   Honey Thick Honey Thick Liquid: Not tested   Puree Puree: Impaired Presentation: Spoon Pharyngeal Phase Impairments: Multiple swallows   Solid   GO    Solid: Within functional limits       [name] T 06/10/2012,10:25 AM

## 2012-06-10 NOTE — Progress Notes (Signed)
ANTIBIOTIC CONSULT NOTE - INITIAL  Pharmacy Consult for Cipro Indication: UTI  Allergies  Allergen Reactions  . Amlodipine Other (See Comments)    Dizziness & elevated BP  . Hydrocodone Itching  . Penicillins Other (See Comments)    Per [facility]    Patient Measurements: Height: 5\' 4"  (162.6 cm) Weight: 153 lb 14.1 oz (69.8 kg) IBW/kg (Calculated) : 54.7 Adjusted Body Weight:   Vital Signs: Temp: 97.7 F (36.5 C) (05/29 0006) Temp src: Oral (05/29 0006) BP: 156/69 mmHg (05/29 0006) Pulse Rate: 83 (05/29 0006) Intake/Output from previous day: 05/28 0701 - 05/29 0700 In: -  Out: 200 [Urine:200] Intake/Output from this shift: Total I/O In: -  Out: 200 [Urine:200]  Labs:  Recent Labs  06/09/12 2135  WBC 7.8  HGB 13.0  PLT 210  CREATININE 1.39*   Estimated Creatinine Clearance: 26.8 ml/min (by C-G formula based on Cr of 1.39). No results found for this basename: VANCOTROUGH, [name], VANCORANDOM, GENTTROUGH, GENTPEAK, GENTRANDOM, TOBRATROUGH, TOBRAPEAK, TOBRARND, AMIKACINPEAK, AMIKACINTROU, AMIKACIN,  in the last 72 hours   Microbiology: Recent Results (from the past 720 hour(s))  MRSA PCR SCREENING     Status: None   Collection Time    06/10/12  1:16 AM      Result Value Range Status   MRSA by PCR NEGATIVE  NEGATIVE Final   Comment:            The GeneXpert MRSA Assay (FDA     approved for NASAL specimens     only), is one component of a     comprehensive MRSA colonization     surveillance program. It is not     intended to diagnose MRSA     infection nor to guide or     monitor treatment for     MRSA infections.    Medical History: Past Medical History  Diagnosis Date  . Hypertension   . Hyperlipidemia   . Cardiomyopathy   . Pacemaker     [employer] [id number]  . PVD (peripheral vascular disease) 2007    stenting b legs 2007,   . Allergic rhinitis   . GERD (gastroesophageal reflux disease)   . Osteoporosis   . Osteoarthritis   .  Gallstones     per [place] 11-2008, also no AAA  . CHF (congestive heart failure)   . Pacemaker     Medications:  Anti-infectives   Start     Dose/Rate Route Frequency Ordered Stop   06/10/12 2200  ciprofloxacin (CIPRO) IVPB 400 mg     400 mg 200 mL/hr over 60 Minutes Intravenous Daily at bedtime 06/10/12 0453     06/09/12 2300  ciprofloxacin (CIPRO) IVPB 400 mg     400 mg 200 mL/hr over 60 Minutes Intravenous  Once 06/09/12 2247 06/10/12 0023   06/09/12 2245  cefTRIAXone (ROCEPHIN) 1 g in dextrose 5 % 50 mL IVPB  Status:  Discontinued     1 g 100 mL/hr over 30 Minutes Intravenous  Once 06/09/12 2239 06/09/12 2246     Assessment: Patient with UTI and poor renal function (CrCl < 82ml/min).  First dose of antibiotics already given in ED.  Goal of Therapy:  Cipro dosed based on patient weight and renal function   Plan:  Follow up culture results Cipro 400mg  iv q24hr  [name] 06/10/2012,4:55 AM

## 2012-06-10 NOTE — Progress Notes (Addendum)
[facility] PROGRESS NOTE  [name] RUE:[id number] DOB: 10-04-1922 DOA: 06/09/2012 PCP: Bufford Spikes, DO  Assessment/Plan: 1. Chest Pain: atypical. Patient denies chest pain. Cardiac enzymes times 3 negative. ECHO ordered.  2. Encephalopathy: Probably in setting of infection. CT head negative. TSH normal. Will check B-12.  3. Acute on chronic Renal failure stage 2: Continue to hold ACE and naproxen. Renal function improved with IV fluids.  4. UTI: UA with moderate leukocytes. Continue with Ciprofloxacin. Will follow urine culture.  5. Chronic Systolic HF: compensated.  6. Dementia: Continue with Namenda.  7. Syncope: Monitor on telemetry. Cardiac enzymes negative/ Will check orthostatic.   Code Status: Full Family Communication: none at bedside.  Disposition Plan: SNF in 1 or 2 days.    Consultants:  none  Procedures:  ECHO  Antibiotics:  Ciprofloxacin 5-28  HPI/Subjective: Patient awake, oriented to person and place, was able to tell me she was at [facility] long. She said she pass out the other day. She denies chest pain. She mention her Heart Dr, Dr Ladona Ridgel. She does relates neck tension, pain.   Objective: Filed Vitals:   06/09/12 2332 06/10/12 0006 06/10/12 0603 06/10/12 0751  BP:  156/69 182/63 115/69  Pulse:  83 70 78  Temp: 99.3 F (37.4 C) 97.7 F (36.5 C) 97.9 F (36.6 C)   TempSrc:  Oral Axillary   Resp:  21 20   Height:  5\' 4"  (1.626 m)    Weight:  69.8 kg (153 lb 14.1 oz)    SpO2:  100% 97%     Intake/Output Summary (Last 24 hours) at 06/10/12 0829 Last data filed at 06/10/12 0800  Gross per 24 hour  Intake  437.5 ml  Output    400 ml  Net   37.5 ml   Filed Weights   06/10/12 0006  Weight: 69.8 kg (153 lb 14.1 oz)    Exam:   General:  Awake oriented to person and situation.   Cardiovascular: S 1, S 2 RRR  Respiratory: CTA  Abdomen: BS present, soft, NT  Musculoskeletal: no edema.   Data Reviewed: Basic Metabolic  Panel:  Recent Labs Lab 06/09/12 2135 06/10/12 0520  NA 134* 134*  K 5.0 4.7  CL 98 101  CO2 20 22  GLUCOSE 100* 85  BUN 60* 56*  CREATININE 1.39* 1.17*  CALCIUM 9.6 9.2   Liver Function Tests: No results found for this basename: AST, ALT, ALKPHOS, BILITOT, PROT, ALBUMIN,  in the last 168 hours No results found for this basename: LIPASE, AMYLASE,  in the last 168 hours No results found for this basename: AMMONIA,  in the last 168 hours CBC:  Recent Labs Lab 06/09/12 2135 06/10/12 0520  WBC 7.8 8.4  NEUTROABS 5.1  --   HGB 13.0 11.9*  HCT 38.0 36.0  MCV 85.4 85.9  PLT 210 210   Cardiac Enzymes:  Recent Labs Lab 06/09/12 2135 06/09/12 2325 06/10/12 0520  TROPONINI <0.30 <0.30 <0.30   BNP (last 3 results)  Recent Labs  06/09/12 2325  PROBNP 506.8*   CBG:  Recent Labs Lab 06/09/12 1938  GLUCAP 97    Recent Results (from the past 240 hour(s))  MRSA PCR SCREENING     Status: None   Collection Time    06/10/12  1:16 AM      Result Value Range Status   MRSA by PCR NEGATIVE  NEGATIVE Final   Comment:  The GeneXpert MRSA Assay (FDA     approved for NASAL specimens     only), is one component of a     comprehensive MRSA colonization     surveillance program. It is not     intended to diagnose MRSA     infection nor to guide or     monitor treatment for     MRSA infections.     Studies: Dg Chest 2 View  06/09/2012   *RADIOLOGY REPORT*  Clinical Data: Chest pain  CHEST - 2 VIEW  Comparison: Prior chest x-ray 08/27/2011  Findings: Stable position of left subclavian approach cardiac rhythm maintenance device with leads project in the right atrium and right ventricle.  Mild cardiomegaly is similar to prior. Inspiratory volumes are low and there is mild bibasilar atelectasis.  No edema or focal airspace consolidation. Degenerative changes and both glenohumeral joints.  No acute osseous abnormality.  IMPRESSION: No acute cardiopulmonary disease    Original Report Authenticated By: Malachy Moan, M.D.   Ct Head Wo Contrast  06/09/2012   *RADIOLOGY REPORT*  Clinical Data: 3 weeks of delusions; subacute delirium.  CT HEAD WITHOUT CONTRAST  Technique:  Contiguous axial images were obtained from the base of the skull through the vertex without contrast.  Comparison: CT of the head performed 08/27/2011  Findings: There is no evidence of acute infarction, mass lesion, or intra- or extra-axial hemorrhage on CT.  Prominence of the ventricles and sulci reflects moderate cortical volume loss.  Diffuse periventricular and subcortical white matter change likely reflects small vessel ischemic microangiopathy.  Mild cerebellar atrophy is noted.  The brainstem and fourth ventricle are within normal limits.  The basal ganglia are unremarkable in appearance.  The cerebral hemispheres demonstrate grossly normal gray-white differentiation. No mass effect or midline shift is seen.  There is no evidence of fracture; visualized osseous structures are unremarkable in appearance.  The orbits are within normal limits. The paranasal sinuses and mastoid air cells are well-aerated.  No significant soft tissue abnormalities are seen.  IMPRESSION:  1.  No acute intracranial pathology seen on CT. 2.  Moderate cortical volume loss and diffuse small vessel ischemic microangiopathy.   Original Report Authenticated By: Tonia Ghent, M.D.    Scheduled Meds: . ciprofloxacin  400 mg Intravenous QHS  . enoxaparin (LOVENOX) injection  40 mg Subcutaneous QHS  . memantine  10 mg Oral BID  . olopatadine  1 drop Both Eyes Daily  . risperiDONE  0.5 mg Oral BID  . saccharomyces boulardii  250 mg Oral BID  . sodium chloride  3 mL Intravenous Q12H  . vitamin B-12  1,000 mcg Oral Daily   Continuous Infusions: . sodium chloride 75 mL/hr at 06/10/12 0055    Principal Problem:   Altered mental state Active Problems:   HYPERTENSION   CARDIOMYOPATHY   Dementia    Time spent: 35  minutes.     Erle Guster  [facility] Pager [phone]. If 7PM-7AM, please contact night-coverage at [email or web address], password [facility] 06/10/2012, 8:29 AM  LOS: 1 day

## 2012-06-11 LAB — BASIC METABOLIC PANEL
BUN: 38 mg/dL — ABNORMAL HIGH (ref 6–23)
CO2: 24 mEq/L (ref 19–32)
Chloride: 101 mEq/L (ref 96–112)
Glucose, Bld: 79 mg/dL (ref 70–99)
Potassium: 4.6 mEq/L (ref 3.5–5.1)

## 2012-06-11 LAB — URINE CULTURE
Colony Count: NO GROWTH
Culture: NO GROWTH

## 2012-06-11 MED ORDER — CIPROFLOXACIN HCL 500 MG PO TABS: 500.0000 mg | ORAL_TABLET | Freq: Two times a day (BID) | ORAL | Status: DC

## 2012-06-11 MED ORDER — LORAZEPAM 2 MG/ML IJ SOLN
1.0000 mg | Freq: Once | INTRAMUSCULAR | Status: AC
  Administered 2012-06-11: 1 mg via INTRAMUSCULAR
  Filled 2012-06-11: qty 1

## 2012-06-11 MED ORDER — LORAZEPAM 2 MG/ML IJ SOLN
INTRAMUSCULAR | Status: AC
  Filled 2012-06-11: qty 1

## 2012-06-11 MED ORDER — LORAZEPAM 2 MG/ML IJ SOLN: 0.5000 mg | Freq: Once | INTRAMUSCULAR | Status: DC

## 2012-06-11 NOTE — Discharge Summary (Signed)
Physician Discharge Summary  [name] BJY:[id number] DOB: April 07, 1922 DOA: 06/09/2012  PCP: Bufford Spikes, DO  Admit date: 06/09/2012 Discharge date: 06/11/2012  Time spent: 35 minutes  Recommendations for Outpatient Follow-up:  1. Please follow urine culture result.   Discharge Diagnoses:    UTI   Encephalopathy   Atypical Chest pain.    HYPERTENSION   CARDIOMYOPATHY   Dementia   Discharge Condition: Stable  Diet recommendation: Heart Healthy  Filed Weights   06/10/12 0006 06/11/12 0500  Weight: 69.8 kg (153 lb 14.1 oz) 71.1 kg (156 lb 12 oz)    History of present illness:  79 y.o. female who presents for evaluation of chest pain. She has a history of hypertension, hyperlipidemia, presumed ischemic cardiomyopathy, PAD, status post stenting to bilateral lower extremities and 2007, bradycardia, status post pacemaker. In the past, she has had an EF of 45% with apparent wall motion abnormalities on echocardiogram. Last echocardiogram 3/09: EF 60%, mild LVH, diastolic dysfunction, Cardiolite scan 11/02: No ischemia or scar, EF 45%. Carotid Doppler 7/11:1-39% bilateral. She was last seen by Dr. Excell Seltzer 9/26.   Per EMS patient from [facility]. Patient has had a 3 week spell of CONFUSION,RECENTLY RX FOR UTI with rocephine , Patient has been "speaking in tongues". At noon an EKG was done (normal paced- available for review), at 1900 EMS was called for a c/o chest pain. Patient denies chest pain currently .Neg Trop and baseline paced EKG.Family also reports seeing episodes of unresponsiveness off and on for the last several days, lasts for seconds to minutes. No seizure-like activity but seems more confused immediately afterwards. Will admit for rule out, hydration, treatment of UTI and further evaluation of AMS.   Hospital Course:   1. Chest Pain: atypical. Patient denies chest pain. Cardiac enzymes times 3 negative. ECHO with no wall motion abnormalities.  2. Encephalopathy:  Probably in setting of infection. CT head negative. TSH normal.  B-12 more than 1000. Component of dementia. Improved, alert and oriented.  3. Acute on chronic Renal failure stage 2: Continue to hold ACE and naproxen. Renal function improved with IV fluids. Cr decrease to 0.8, peak to 1.39 4. UTI: UA with moderate leukocytes. Continue with Ciprofloxacin. Please follow urine culture result.  5. Chronic Systolic HF: compensated.  6. Dementia: Continue with Namenda.  7. Syncope: Monitor on telemetry. Cardiac enzymes negative/not orthostatic.  8.  Procedures: ECHO: - Left ventricle: Technically limited. The cavity size was normal. Wall thickness was increased in a pattern of mild LVH. The estimated ejection fraction was 60%. Wall motion was normal; there were no regional wall motion abnormalities. - Aortic valve: Sclerosis without stenosis. - Mitral valve: Mildly calcified annulus. - Right ventricle: Poorly visualized. Suspect RV function is OK. I can not see well enough to comment on pacemaker mentioned in the history. - Pulmonary arteries: PA peak     Consultations:  None  Discharge Exam: Filed Vitals:   06/10/12 1531 06/10/12 2122 06/11/12 0413 06/11/12 0500  BP: 141/49 150/55 139/61   Pulse: 75 67 68   Temp:  97.5 F (36.4 C) 97.6 F (36.4 C)   TempSrc:  Oral Oral   Resp:  20 18   Height:      Weight:    71.1 kg (156 lb 12 oz)  SpO2:  96% 95%     General: No distress. Cardiovascular: S 1, S 2 RRR Respiratory: CTA Neuro: alert, oriented to person , place, recognized nice.   Discharge Instructions  Discharge  Orders   Future Orders Complete By Expires     Diet - low sodium heart healthy  As directed     Increase activity slowly  As directed         Medication List    STOP taking these medications       lisinopril 5 MG tablet  Commonly known as:  PRINIVIL,ZESTRIL     naproxen sodium 220 MG tablet  Commonly known as:  ANAPROX     ROCEPHIN 1 G injection   Generic drug:  cefTRIAXone      TAKE these medications       aspirin EC 81 MG tablet  Take 81 mg by mouth daily.     BIOFREEZE 4 % Gel  Generic drug:  Menthol (Topical Analgesic)  Apply 1 application topically 4 (four) times daily as needed. For pain.     calcium-vitamin D 500-200 MG-UNIT per tablet  Take 1 tablet by mouth daily.     ciprofloxacin 500 MG tablet  Commonly known as:  CIPRO  Take 1 tablet (500 mg total) by mouth 2 (two) times daily.     LORazepam 2 MG/ML injection  Commonly known as:  ATIVAN  Inject 0.5 mg into the vein every 12 (twelve) hours as needed for anxiety.     memantine 10 MG tablet  Commonly known as:  NAMENDA  Take 10 mg by mouth 2 (two) times daily.     PATADAY 0.2 % Soln  Generic drug:  Olopatadine HCl  Place 1 drop into both eyes daily.     polyethylene glycol packet  Commonly known as:  MIRALAX / GLYCOLAX  Take 17 g by mouth every other day.     Propylene Glycol 0.6 % Soln  Place 1 drop into both eyes 3 (three) times daily.     risperiDONE 1 MG/ML oral solution  Commonly known as:  RISPERDAL  Take 0.5 mg by mouth 2 (two) times daily.     saccharomyces boulardii 250 MG capsule  Commonly known as:  FLORASTOR  Take 250 mg by mouth 2 (two) times daily. 14 day course of therapy started 06/01/12.     vitamin B-12 1000 MCG tablet  Commonly known as:  CYANOCOBALAMIN  Take 1,000 mcg by mouth daily.       Allergies  Allergen Reactions  . Amlodipine Other (See Comments)    Dizziness & elevated BP  . Hydrocodone Itching  . Penicillins Other (See Comments)    Per [facility]       Follow-up Information   Follow up with REED, TIFFANY, DO In 1 week.   Contact information:   [address] [place] [address] [phone]        The results of significant diagnostics from this hospitalization (including imaging, microbiology, ancillary and laboratory) are listed below for reference.    Significant Diagnostic Studies: Dg Chest 2  View  06/09/2012   *RADIOLOGY REPORT*  Clinical Data: Chest pain  CHEST - 2 VIEW  Comparison: Prior chest x-ray 08/27/2011  Findings: Stable position of left subclavian approach cardiac rhythm maintenance device with leads project in the right atrium and right ventricle.  Mild cardiomegaly is similar to prior. Inspiratory volumes are low and there is mild bibasilar atelectasis.  No edema or focal airspace consolidation. Degenerative changes and both glenohumeral joints.  No acute osseous abnormality.  IMPRESSION: No acute cardiopulmonary disease   Original Report Authenticated By: Malachy Moan, M.D.   Ct Head Wo Contrast  06/09/2012   *  RADIOLOGY REPORT*  Clinical Data: 3 weeks of delusions; subacute delirium.  CT HEAD WITHOUT CONTRAST  Technique:  Contiguous axial images were obtained from the base of the skull through the vertex without contrast.  Comparison: CT of the head performed 08/27/2011  Findings: There is no evidence of acute infarction, mass lesion, or intra- or extra-axial hemorrhage on CT.  Prominence of the ventricles and sulci reflects moderate cortical volume loss.  Diffuse periventricular and subcortical white matter change likely reflects small vessel ischemic microangiopathy.  Mild cerebellar atrophy is noted.  The brainstem and fourth ventricle are within normal limits.  The basal ganglia are unremarkable in appearance.  The cerebral hemispheres demonstrate grossly normal gray-white differentiation. No mass effect or midline shift is seen.  There is no evidence of fracture; visualized osseous structures are unremarkable in appearance.  The orbits are within normal limits. The paranasal sinuses and mastoid air cells are well-aerated.  No significant soft tissue abnormalities are seen.  IMPRESSION:  1.  No acute intracranial pathology seen on CT. 2.  Moderate cortical volume loss and diffuse small vessel ischemic microangiopathy.   Original Report Authenticated By: Tonia Ghent, M.D.     Microbiology: Recent Results (from the past 240 hour(s))  MRSA PCR SCREENING     Status: None   Collection Time    06/10/12  1:16 AM      Result Value Range Status   MRSA by PCR NEGATIVE  NEGATIVE Final   Comment:            The GeneXpert MRSA Assay (FDA     approved for NASAL specimens     only), is one component of a     comprehensive MRSA colonization     surveillance program. It is not     intended to diagnose MRSA     infection nor to guide or     monitor treatment for     MRSA infections.     Labs: Basic Metabolic Panel:  Recent Labs Lab 06/09/12 2135 06/10/12 0520 06/11/12 0444  NA 134* 134* 133*  K 5.0 4.7 4.6  CL 98 101 101  CO2 20 22 24   GLUCOSE 100* 85 79  BUN 60* 56* 38*  CREATININE 1.39* 1.17* 0.80  CALCIUM 9.6 9.2 8.9   Liver Function Tests: No results found for this basename: AST, ALT, ALKPHOS, BILITOT, PROT, ALBUMIN,  in the last 168 hours No results found for this basename: LIPASE, AMYLASE,  in the last 168 hours No results found for this basename: AMMONIA,  in the last 168 hours CBC:  Recent Labs Lab 06/09/12 2135 06/10/12 0520  WBC 7.8 8.4  NEUTROABS 5.1  --   HGB 13.0 11.9*  HCT 38.0 36.0  MCV 85.4 85.9  PLT 210 210   Cardiac Enzymes:  Recent Labs Lab 06/09/12 2135 06/09/12 2325 06/10/12 0520 06/10/12 1046  TROPONINI <0.30 <0.30 <0.30 <0.30   BNP: BNP (last 3 results)  Recent Labs  06/09/12 2325  PROBNP 506.8*   CBG:  Recent Labs Lab 06/09/12 1938  GLUCAP 97       Signed:  Jorel Gravlin  [facility] 06/11/2012, 10:21 AM

## 2012-06-11 NOTE — Progress Notes (Signed)
Speech Language Pathology Dysphagia Treatment Patient Details Name: [name] MRN: [id number] DOB: 08/26/1922 Today's Date: 06/11/2012 Time: 1120-1150 SLP Time Calculation (min): 30 min  Assessment / Plan / Recommendation Clinical Impression  Pt. appears to be tolerating current diet, without s/s of aspiration noted at bedside.  Pt. reports choking on liquids if she uses a straw.  Lung sounds are diminished, but clear, and pt is afebrile.  For d/c today to SNF.  Educated pt. to diet recommendations and aspiration precautions.  D/C acute care ST, as goals have been met.    Diet Recommendation  Continue with Current Diet: Dysphagia 3 (mechanical soft);Thin liquid    SLP Plan Discharge SLP treatment due to (comment);All goals met;Other (Comment) (d/c to SNF today.)   Pertinent Vitals/Pain n/a   Swallowing Goals     General Temperature Spikes Noted: No Respiratory Status: Room air Behavior/Cognition: Alert;Cooperative;Pleasant mood Oral Cavity - Dentition: Dentures, top;Dentures, bottom Patient Positioning: Upright in bed  Oral Cavity - Oral Hygiene Does patient have any of the following "at risk" factors?: None of the above Brush patient's teeth BID with toothbrush (using toothpaste with fluoride): Yes   Dysphagia Treatment Treatment focused on: Skilled observation of diet tolerance;Patient/family/caregiver education Family/Caregiver Educated: Pt.  Treatment Methods/Modalities: Skilled observation Patient observed directly with PO's: Yes Type of PO's observed: Thin liquids Feeding: Able to feed self;Needs assist Liquids provided via: Cup;No straw Type of cueing: Verbal Amount of cueing: Minimal   GO     Maryjo Rochester T 06/11/2012, 12:24 PM

## 2012-06-11 NOTE — Progress Notes (Signed)
Patient is set to discharge back to [facility] today. Patient & niece at bedside aware. Discharge packet in [place]. [facility]AR scheduled for 12noon pickup.   Ashley Bailey, LCSW [facility] Clinical Social Worker cell #: [phone]

## 2012-06-11 NOTE — Progress Notes (Signed)
Report called to [facility] and talked with heather.

## 2012-06-13 ENCOUNTER — Inpatient Hospital Stay (HOSPITAL_COMMUNITY)
Admission: EM | Admit: 2012-06-13 | Discharge: 2012-06-16 | DRG: 189 | Disposition: A | Discharge status: Skilled Nursing Facility | Payer: Medicare Other | Attending: Internal Medicine | Admitting: Internal Medicine

## 2012-06-13 ENCOUNTER — Emergency Department (HOSPITAL_COMMUNITY): Payer: Medicare Other

## 2012-06-13 ENCOUNTER — Encounter (HOSPITAL_COMMUNITY): Payer: Self-pay | Admitting: Emergency Medicine

## 2012-06-13 DIAGNOSIS — R42 Dizziness and giddiness: Secondary | ICD-10-CM

## 2012-06-13 DIAGNOSIS — I10 Essential (primary) hypertension: Secondary | ICD-10-CM

## 2012-06-13 DIAGNOSIS — Z87891 Personal history of nicotine dependence: Secondary | ICD-10-CM

## 2012-06-13 DIAGNOSIS — K121 Other forms of stomatitis: Secondary | ICD-10-CM

## 2012-06-13 DIAGNOSIS — E871 Hypo-osmolality and hyponatremia: Secondary | ICD-10-CM | POA: Diagnosis present

## 2012-06-13 DIAGNOSIS — G9341 Metabolic encephalopathy: Secondary | ICD-10-CM | POA: Diagnosis present

## 2012-06-13 DIAGNOSIS — F0390 Unspecified dementia without behavioral disturbance: Secondary | ICD-10-CM | POA: Diagnosis present

## 2012-06-13 DIAGNOSIS — Z66 Do not resuscitate: Secondary | ICD-10-CM | POA: Diagnosis present

## 2012-06-13 DIAGNOSIS — N390 Urinary tract infection, site not specified: Secondary | ICD-10-CM | POA: Diagnosis present

## 2012-06-13 DIAGNOSIS — M810 Age-related osteoporosis without current pathological fracture: Secondary | ICD-10-CM | POA: Diagnosis present

## 2012-06-13 DIAGNOSIS — Z993 Dependence on wheelchair: Secondary | ICD-10-CM

## 2012-06-13 DIAGNOSIS — I442 Atrioventricular block, complete: Secondary | ICD-10-CM

## 2012-06-13 DIAGNOSIS — J309 Allergic rhinitis, unspecified: Secondary | ICD-10-CM

## 2012-06-13 DIAGNOSIS — E875 Hyperkalemia: Secondary | ICD-10-CM | POA: Diagnosis present

## 2012-06-13 DIAGNOSIS — R634 Abnormal weight loss: Secondary | ICD-10-CM

## 2012-06-13 DIAGNOSIS — Z950 Presence of cardiac pacemaker: Secondary | ICD-10-CM

## 2012-06-13 DIAGNOSIS — I739 Peripheral vascular disease, unspecified: Secondary | ICD-10-CM

## 2012-06-13 DIAGNOSIS — N179 Acute kidney failure, unspecified: Secondary | ICD-10-CM | POA: Diagnosis present

## 2012-06-13 DIAGNOSIS — R1310 Dysphagia, unspecified: Secondary | ICD-10-CM | POA: Diagnosis present

## 2012-06-13 DIAGNOSIS — I959 Hypotension, unspecified: Secondary | ICD-10-CM | POA: Diagnosis present

## 2012-06-13 DIAGNOSIS — L299 Pruritus, unspecified: Secondary | ICD-10-CM

## 2012-06-13 DIAGNOSIS — R451 Restlessness and agitation: Secondary | ICD-10-CM

## 2012-06-13 DIAGNOSIS — R7989 Other specified abnormal findings of blood chemistry: Secondary | ICD-10-CM

## 2012-06-13 DIAGNOSIS — Z7982 Long term (current) use of aspirin: Secondary | ICD-10-CM

## 2012-06-13 DIAGNOSIS — E538 Deficiency of other specified B group vitamins: Secondary | ICD-10-CM

## 2012-06-13 DIAGNOSIS — K219 Gastro-esophageal reflux disease without esophagitis: Secondary | ICD-10-CM | POA: Diagnosis present

## 2012-06-13 DIAGNOSIS — J9601 Acute respiratory failure with hypoxia: Secondary | ICD-10-CM | POA: Diagnosis present

## 2012-06-13 DIAGNOSIS — Z7401 Bed confinement status: Secondary | ICD-10-CM

## 2012-06-13 DIAGNOSIS — I2510 Atherosclerotic heart disease of native coronary artery without angina pectoris: Secondary | ICD-10-CM | POA: Diagnosis present

## 2012-06-13 DIAGNOSIS — I2589 Other forms of chronic ischemic heart disease: Secondary | ICD-10-CM | POA: Diagnosis present

## 2012-06-13 DIAGNOSIS — F03918 Unspecified dementia, unspecified severity, with other behavioral disturbance: Secondary | ICD-10-CM | POA: Diagnosis present

## 2012-06-13 DIAGNOSIS — E872 Acidosis, unspecified: Secondary | ICD-10-CM | POA: Diagnosis present

## 2012-06-13 DIAGNOSIS — I9589 Other hypotension: Secondary | ICD-10-CM | POA: Diagnosis present

## 2012-06-13 DIAGNOSIS — K8020 Calculus of gallbladder without cholecystitis without obstruction: Secondary | ICD-10-CM

## 2012-06-13 DIAGNOSIS — R8290 Unspecified abnormal findings in urine: Secondary | ICD-10-CM | POA: Diagnosis present

## 2012-06-13 DIAGNOSIS — N289 Disorder of kidney and ureter, unspecified: Secondary | ICD-10-CM

## 2012-06-13 DIAGNOSIS — E785 Hyperlipidemia, unspecified: Secondary | ICD-10-CM | POA: Diagnosis present

## 2012-06-13 DIAGNOSIS — Z888 Allergy status to other drugs, medicaments and biological substances status: Secondary | ICD-10-CM

## 2012-06-13 DIAGNOSIS — F0391 Unspecified dementia with behavioral disturbance: Secondary | ICD-10-CM

## 2012-06-13 DIAGNOSIS — I70209 Unspecified atherosclerosis of native arteries of extremities, unspecified extremity: Secondary | ICD-10-CM | POA: Diagnosis present

## 2012-06-13 DIAGNOSIS — R4182 Altered mental status, unspecified: Secondary | ICD-10-CM

## 2012-06-13 DIAGNOSIS — I498 Other specified cardiac arrhythmias: Secondary | ICD-10-CM | POA: Diagnosis present

## 2012-06-13 DIAGNOSIS — R627 Adult failure to thrive: Secondary | ICD-10-CM | POA: Diagnosis present

## 2012-06-13 DIAGNOSIS — Z8679 Personal history of other diseases of the circulatory system: Secondary | ICD-10-CM

## 2012-06-13 DIAGNOSIS — E43 Unspecified severe protein-calorie malnutrition: Secondary | ICD-10-CM | POA: Diagnosis present

## 2012-06-13 DIAGNOSIS — Z880 Allergy status to penicillin: Secondary | ICD-10-CM

## 2012-06-13 DIAGNOSIS — E860 Dehydration: Secondary | ICD-10-CM | POA: Diagnosis present

## 2012-06-13 DIAGNOSIS — Z515 Encounter for palliative care: Secondary | ICD-10-CM

## 2012-06-13 DIAGNOSIS — M1990 Unspecified osteoarthritis, unspecified site: Secondary | ICD-10-CM

## 2012-06-13 DIAGNOSIS — IMO0002 Reserved for concepts with insufficient information to code with codable children: Secondary | ICD-10-CM | POA: Diagnosis present

## 2012-06-13 DIAGNOSIS — K1370 Unspecified lesions of oral mucosa: Secondary | ICD-10-CM | POA: Diagnosis present

## 2012-06-13 DIAGNOSIS — F411 Generalized anxiety disorder: Secondary | ICD-10-CM | POA: Diagnosis not present

## 2012-06-13 DIAGNOSIS — J9600 Acute respiratory failure, unspecified whether with hypoxia or hypercapnia: Principal | ICD-10-CM | POA: Diagnosis present

## 2012-06-13 DIAGNOSIS — Z79899 Other long term (current) drug therapy: Secondary | ICD-10-CM

## 2012-06-13 DIAGNOSIS — R0902 Hypoxemia: Secondary | ICD-10-CM

## 2012-06-13 LAB — BASIC METABOLIC PANEL
BUN: 37 mg/dL — ABNORMAL HIGH (ref 6–23)
BUN: 40 mg/dL — ABNORMAL HIGH (ref 6–23)
CO2: 21 mEq/L (ref 19–32)
Calcium: 9.3 mg/dL (ref 8.4–10.5)
Chloride: 97 mEq/L (ref 96–112)
Chloride: 96 mEq/L (ref 96–112)
Creatinine, Ser: 1.3 mg/dL — ABNORMAL HIGH (ref 0.50–1.10)
Creatinine, Ser: 1.29 mg/dL — ABNORMAL HIGH (ref 0.50–1.10)
GFR calc Af Amer: 42 mL/min — ABNORMAL LOW (ref >90)
GFR calc non Af Amer: 36 mL/min — ABNORMAL LOW (ref >90)
Glucose, Bld: 128 mg/dL — ABNORMAL HIGH (ref 70–99)

## 2012-06-13 LAB — URINALYSIS, ROUTINE W REFLEX MICROSCOPIC
Bilirubin Urine: NEGATIVE
Glucose, UA: NEGATIVE mg/dL
Hgb urine dipstick: NEGATIVE
Specific Gravity, Urine: 1.017 (ref 1.005–1.030)
Urobilinogen, UA: 0.2 mg/dL (ref 0.0–1.0)

## 2012-06-13 LAB — POCT I-STAT 3, ART BLOOD GAS (G3+)
O2 Saturation: 97 %
pCO2 arterial: 34.3 mmHg — ABNORMAL LOW (ref 35.0–45.0)

## 2012-06-13 LAB — CBC WITH DIFFERENTIAL/PLATELET
Basophils Relative: 1 % (ref 0–1)
Eosinophils Absolute: 0.5 10*3/uL (ref 0.0–0.7)
HCT: 34.8 % — ABNORMAL LOW (ref 36.0–46.0)
Hemoglobin: 12.1 g/dL (ref 12.0–15.0)
Lymphs Abs: 0.9 10*3/uL (ref 0.7–4.0)
MCH: 30 pg (ref 26.0–34.0)
MCHC: 34.8 g/dL (ref 30.0–36.0)
Monocytes Absolute: 0.3 10*3/uL (ref 0.1–1.0)
Monocytes Relative: 4 % (ref 3–12)
Neutro Abs: 5.8 10*3/uL (ref 1.7–7.7)

## 2012-06-13 LAB — URINE MICROSCOPIC-ADD ON

## 2012-06-13 LAB — LACTIC ACID, PLASMA: Lactic Acid, Venous: 2.5 mmol/L — ABNORMAL HIGH (ref 0.5–2.2)

## 2012-06-13 LAB — MRSA PCR SCREENING: MRSA by PCR: NEGATIVE

## 2012-06-13 MED ORDER — ACETAMINOPHEN 650 MG RE SUPP: 650.0000 mg | Freq: Four times a day (QID) | RECTAL | Status: DC | PRN

## 2012-06-13 MED ORDER — ONDANSETRON HCL 4 MG/2ML IJ SOLN: 4.0000 mg | Freq: Four times a day (QID) | INTRAMUSCULAR | Status: DC | PRN

## 2012-06-13 MED ORDER — MEMANTINE HCL 10 MG PO TABS
10.0000 mg | ORAL_TABLET | Freq: Two times a day (BID) | ORAL | Status: DC
  Administered 2012-06-13 – 2012-06-16 (×5): 10 mg via ORAL
  Filled 2012-06-13 (×7): qty 1

## 2012-06-13 MED ORDER — ACETAMINOPHEN 325 MG PO TABS: 650.0000 mg | ORAL_TABLET | Freq: Four times a day (QID) | ORAL | Status: DC | PRN

## 2012-06-13 MED ORDER — SODIUM CHLORIDE 0.9 % IV SOLN
INTRAVENOUS | Status: DC
  Administered 2012-06-13 – 2012-06-14 (×2): via INTRAVENOUS
  Administered 2012-06-14: 1000 mL via INTRAVENOUS
  Administered 2012-06-15: 02:00:00 via INTRAVENOUS

## 2012-06-13 MED ORDER — LORAZEPAM 2 MG/ML IJ SOLN
0.5000 mg | INTRAMUSCULAR | Status: DC | PRN
  Administered 2012-06-14 – 2012-06-16 (×4): 0.5 mg via INTRAVENOUS
  Filled 2012-06-13 (×4): qty 1

## 2012-06-13 MED ORDER — ENOXAPARIN SODIUM 30 MG/0.3ML ~~LOC~~ SOLN
30.0000 mg | SUBCUTANEOUS | Status: DC
  Administered 2012-06-13 – 2012-06-14 (×2): 30 mg via SUBCUTANEOUS
  Filled 2012-06-13 (×3): qty 0.3

## 2012-06-13 MED ORDER — POLYETHYLENE GLYCOL 3350 17 G PO PACK
17.0000 g | PACK | Freq: Every day | ORAL | Status: DC | PRN
  Filled 2012-06-13: qty 1

## 2012-06-13 MED ORDER — VANCOMYCIN HCL IN DEXTROSE 1-5 GM/200ML-% IV SOLN
1000.0000 mg | INTRAVENOUS | Status: DC
  Administered 2012-06-13 – 2012-06-14 (×2): 1000 mg via INTRAVENOUS
  Filled 2012-06-13 (×3): qty 200

## 2012-06-13 MED ORDER — OLOPATADINE HCL 0.2 % OP SOLN: 1.0000 [drp] | Freq: Every day | OPHTHALMIC | Status: DC

## 2012-06-13 MED ORDER — DEXTROSE 5 % IV SOLN
1.0000 g | Freq: Two times a day (BID) | INTRAVENOUS | Status: DC
  Administered 2012-06-13 – 2012-06-15 (×4): 1 g via INTRAVENOUS
  Filled 2012-06-13 (×5): qty 1

## 2012-06-13 MED ORDER — ASPIRIN EC 81 MG PO TBEC
81.0000 mg | DELAYED_RELEASE_TABLET | Freq: Every day | ORAL | Status: DC
  Administered 2012-06-13 – 2012-06-16 (×3): 81 mg via ORAL
  Filled 2012-06-13 (×4): qty 1

## 2012-06-13 MED ORDER — OLOPATADINE HCL 0.1 % OP SOLN
1.0000 [drp] | Freq: Every day | OPHTHALMIC | Status: DC
  Administered 2012-06-13 – 2012-06-16 (×4): 1 [drp] via OPHTHALMIC
  Filled 2012-06-13: qty 5

## 2012-06-13 MED ORDER — SODIUM CHLORIDE 0.9 % IV BOLUS (SEPSIS)
1000.0000 mL | Freq: Once | INTRAVENOUS | Status: AC
  Administered 2012-06-13: 1000 mL via INTRAVENOUS

## 2012-06-13 NOTE — ED Notes (Signed)
Pt responding to foley insertion by moaning and grimacing .

## 2012-06-13 NOTE — H&P (Addendum)
[facility] History and Physical  [name] YNW:[id number] DOB: 1922-05-16 DOA: 06/13/2012  Referring physician: EDP PCP: Bufford Spikes, DO    Chief Complaint: hypotension/hypoxia  HPI: [name] is a 79 y.o. female with h/o Dementia, agitation, bed bound, resident of SNF is brought to the ER with the above complaints. She was just discharged from [facility] 2 days ago, she was then treated with Ciprofloxacin for UTi and metabolic encephalopathy, Urine Cx negative then, She has been taking ciprofloxacin since discharge. I called SNF, due to shift change not much information obtained from there. Per Niece her mentation has been poor during hospitalization and at discharge back, occasional cough noted No h/o diarrhea at SNF, but 1 episode of diarrhea while in ER. EMS found her hypotensive in SNF with SBP of 85 and Hypoxic with O2sats in 80s In ER, BP improved to 124 with IVF, was placed on NRM not weaned yet and lactic acid of 3.3, electrolytes abnormal but specimen hemolyzed.   Review of Systems: The patient denies anorexia, fever, weight loss,, vision loss, decreased hearing, hoarseness, chest pain, syncope, dyspnea on exertion, peripheral edema, balance deficits, hemoptysis, abdominal pain, melena, hematochezia, severe indigestion/heartburn, hematuria, incontinence, genital sores, muscle weakness, suspicious skin lesions, transient blindness, difficulty walking, depression, unusual weight change, abnormal bleeding, enlarged lymph nodes, angioedema, and breast masses.    Past Medical History  Diagnosis Date  . Hypertension   . Hyperlipidemia   . Cardiomyopathy   . Pacemaker     [employer] [id number]  . PVD (peripheral vascular disease) 2007    stenting b legs 2007,   . Allergic rhinitis   . GERD (gastroesophageal reflux disease)   . Osteoporosis   . Osteoarthritis   . Gallstones     per [place] 11-2008, also no AAA  . CHF (congestive heart failure)    . Pacemaker    Past Surgical History  Procedure Laterality Date  . Pacemaker insertion      permanent   . Breast biopsy benign     Social History:  reports that she has quit smoking. She does not have any smokeless tobacco history on file. She reports that she does not drink alcohol. Her drug history is not on file. Lives in SNF  Allergies  Allergen Reactions  . Amlodipine Other (See Comments)    Dizziness & elevated BP  . Hydrocodone Itching  . Penicillins Other (See Comments)    Per MAR    Family History  Problem Relation Age of Onset  . Hypertension    . Breast cancer Neg Hx   . Colon cancer Neg Hx     Prior to Admission medications   Medication Sig Start Date End Date Taking? Authorizing Provider  aspirin EC 81 MG tablet Take 81 mg by mouth daily.     Yes Historical Provider, MD  calcium-vitamin D (OSCAL WITH D) 500-200 MG-UNIT per tablet Take 1 tablet by mouth daily.   Yes Historical Provider, MD  ciprofloxacin (CIPRO) 500 MG tablet Take 500 mg by mouth 2 (two) times daily.   Yes Historical Provider, MD  cyanocobalamin 500 MCG tablet Take 1,000 mcg by mouth daily.   Yes Historical Provider, MD  LORazepam (ATIVAN) 2 MG/ML injection Inject 0.5 mg into the vein every 12 (twelve) hours as needed for anxiety.   Yes Historical Provider, MD  memantine (NAMENDA) 10 MG tablet Take 10 mg by mouth 2 (two) times daily.    Yes Historical Provider, MD  Menthol, Topical Analgesic, (BIOFREEZE) 4 % GEL Apply 1 application topically 4 (four) times daily as needed. For pain.   Yes Historical Provider, MD  Olopatadine HCl (PATADAY) 0.2 % SOLN Place 1 drop into both eyes daily.   Yes Historical Provider, MD  polyethylene glycol (MIRALAX / GLYCOLAX) packet Take 17 g by mouth every other day.    Yes Historical Provider, MD  Propylene Glycol 0.6 % SOLN Place 1 drop into both eyes 3 (three) times daily.   Yes Historical Provider, MD  risperiDONE (RISPERDAL) 1 MG/ML oral solution Take 0.5 mg by  mouth 2 (two) times daily.   Yes Historical Provider, MD  saccharomyces boulardii (FLORASTOR) 250 MG capsule Take 250 mg by mouth 2 (two) times daily. 14 day course of therapy started 06/01/12.   Yes Historical Provider, MD   Physical Exam: Filed Vitals:   06/13/12 1745 06/13/12 1815 06/13/12 1830 06/13/12 1845  BP: 124/40 98/45 120/76 109/51  Pulse:      Resp: 23 23 18 17   SpO2:         General:  Lethargic, arousible, mumbles and answers few questions, confused  HEENT: oral mucosa, dry, no JVD  Cardiovascular: S1S2/RRR  Respiratory: CTAB, diminished at bases  Abdomen: soft, Nt, BS present  Skin: no rashes  Musculoskeletal:no edema c/c  Psychiatric: unable to assess  Neurologic: limited due to mentation, moves all extremities voluntarily  Labs on Admission:  Basic Metabolic Panel:  Recent Labs Lab 06/09/12 2135 06/10/12 0520 06/11/12 0444 06/13/12 1750  NA 134* 134* 133* 128*  K 5.0 4.7 4.6 6.0*  CL 98 101 101 97  CO2 20 22 24 21   GLUCOSE 100* 85 79 128*  BUN 60* 56* 38* 37*  CREATININE 1.39* 1.17* 0.80 1.30*  CALCIUM 9.6 9.2 8.9 9.1   Liver Function Tests: No results found for this basename: AST, ALT, ALKPHOS, BILITOT, PROT, ALBUMIN,  in the last 168 hours No results found for this basename: LIPASE, AMYLASE,  in the last 168 hours No results found for this basename: AMMONIA,  in the last 168 hours CBC:  Recent Labs Lab 06/09/12 2135 06/10/12 0520 06/13/12 1750  WBC 7.8 8.4 7.6  NEUTROABS 5.1  --  5.8  HGB 13.0 11.9* 12.1  HCT 38.0 36.0 34.8*  MCV 85.4 85.9 86.1  PLT 210 210 234   Cardiac Enzymes:  Recent Labs Lab 06/09/12 2135 06/09/12 2325 06/10/12 0520 06/10/12 1046 06/13/12 1750  TROPONINI <0.30 <0.30 <0.30 <0.30 <0.30    BNP (last 3 results)  Recent Labs  06/09/12 2325  PROBNP 506.8*   CBG:  Recent Labs Lab 06/09/12 1938  GLUCAP 97    Radiological Exams on Admission: Dg Chest Portable 1 View  06/13/2012   *RADIOLOGY  REPORT*  Clinical Data: Altered mental status.  PORTABLE CHEST - 1 VIEW  Comparison: 06/09/2012 and 08/27/2011.  Findings: 1728 hours.  Overall pulmonary aeration has improved. The lungs are clear.  There is no pleural effusion or pneumothorax. Heart size and mediastinal contours are stable with aortic atherosclerosis.  Left subclavian pacemaker leads appear unchanged. Glenohumeral degenerative changes are present bilaterally.  IMPRESSION: Interval improved aeration of the lung bases.  No acute cardiopulmonary process.   Original Report Authenticated By: Carey Bullocks, M.D.    EKG: Independently reviewed. Paced  Assessment/Plan Active Problems:   CARDIOMYOPATHY   Dementia   Hypotension   Hypoxia  1. Hypotension/Hypoxia -Bp improved after IVF bolus  -etiology not clear, could be early sepsis given lactic acidosis -  Empiric IV Abx( VAnc/Zosyn< IVF -Blood CX x2 -check Cdiff PCR -Repeat CXR in am, as CXR may show infiltrate/aspiration pneumonia after rehydration, check swallow eval too -if remains hypotensive and hypoxic will need VQ scan  -check lower ext dopplers -also cycle cardiac enzymes x2 -check UA  2. Dementia: -continue namenda, ativan PRN -hold risperdone till mentation improves  3. Metabolic encephalopathy -from 1 -CT head 5/28 without acute changes -hold risperdone for now  4. Hyponatremia/hyperkaelmia -but specimen hemolyzed -repeat STAT Bmet now  5. CAD/Ischemic cardiomyopathy -EF improved to 60% based on echo 5/14  DVt proph: lovenox  Global: Discussed ongoing decline with dementia, age, dysphagia and requested Palliative consult for goals of care with Neice   Code Status: DNR Family Communication: d/w niece and HCPOA, [name] Disposition Plan: inpatient Time spent:  Zannie Cove [facility] Pager [phone]  If 7PM-7AM, please contact night-coverage [email or web address] Password [facility] 06/13/2012, 7:41 PM

## 2012-06-13 NOTE — ED Notes (Signed)
Pt having multiple loose stools.

## 2012-06-13 NOTE — ED Notes (Addendum)
PT presents to ED via EMS with altered mental status. [facility] from [facility] on [address]. EMS was called out for declining mental status. When EMS arrived heart rate in the 30's and sats 50%. EMS placed PIV , gave approx 50ml NS and 5mg  albuterol via mask. On arrival to ED patient alert, not responding to pain or following commands.  sats 97 while still getting breathing treatment that EMS started, heart rate 90.

## 2012-06-13 NOTE — ED Notes (Signed)
PT vomited large amount of light brown emesis.

## 2012-06-13 NOTE — ED Provider Notes (Signed)
I saw and evaluated the patient, reviewed the resident's note and I agree with the findings and plan.   Dione Booze, MD 06/13/12 1740

## 2012-06-13 NOTE — ED Provider Notes (Signed)
I have spoken to family along with pt's nurse. I have spoken to her health care power of attorney, [name] -- she is pt's niece. She states she is patient's HPO. She states that she does not want [place] to intubate patient, does not want chest compressions, and does not want a central line placed. She states she wants the medical team to be aggressive with IVs and fluids and antibiotics if required, but does not want intubation, does not want chest compressions. She is made DNR/DNI.   Bernadene Person, MD 06/13/12 1730

## 2012-06-13 NOTE — Progress Notes (Addendum)
ANTIBIOTIC CONSULT NOTE - INITIAL  Pharmacy Consult for Vancomycin and Zosyn Indication: rule out pneumonia  Allergies  Allergen Reactions  . Amlodipine Other (See Comments)    Dizziness & elevated BP  . Hydrocodone Itching  . Penicillins Other (See Comments)    Per [facility]    Patient Measurements: Height: 5\' 4"  (162.6 cm) Weight: 156 lb 11.9 oz (71.098 kg) IBW/kg (Calculated) : 54.7  Vital Signs: BP: 109/51 mmHg (06/01 1845) Pulse Rate: 99 (06/01 1715) Intake/Output from previous day:   Intake/Output from this shift:    Labs:  Recent Labs  06/11/12 0444 06/13/12 1750  WBC  --  7.6  HGB  --  12.1  PLT  --  234  CREATININE 0.80 1.30*   The CrCl is unknown because both a height and weight (above a minimum accepted value) are required for this calculation. No results found for this basename: VANCOTROUGH, Leodis Binet, VANCORANDOM, GENTTROUGH, GENTPEAK, GENTRANDOM, TOBRATROUGH, TOBRAPEAK, TOBRARND, AMIKACINPEAK, AMIKACINTROU, AMIKACIN,  in the last 72 hours   Microbiology: Recent Results (from the past 720 hour(s))  URINE CULTURE     Status: None   Collection Time    06/09/12  8:33 PM      Result Value Range Status   Specimen Description URINE, CLEAN CATCH   Final   Special Requests NONE   Final   Culture  Setup Time 06/10/2012 07:53   Final   Colony Count NO GROWTH   Final   Culture NO GROWTH   Final   Report Status 06/11/2012 FINAL   Final  MRSA PCR SCREENING     Status: None   Collection Time    06/10/12  1:16 AM      Result Value Range Status   MRSA by PCR NEGATIVE  NEGATIVE Final   Comment:            The GeneXpert MRSA Assay (FDA     approved for NASAL specimens     only), is one component of a     comprehensive MRSA colonization     surveillance program. It is not     intended to diagnose MRSA     infection nor to guide or     monitor treatment for     MRSA infections.    Medical History: Past Medical History  Diagnosis Date  . Hypertension   .  Hyperlipidemia   . Cardiomyopathy   . Pacemaker     [employer] [id number]  . PVD (peripheral vascular disease) 2007    stenting b legs 2007,   . Allergic rhinitis   . GERD (gastroesophageal reflux disease)   . Osteoporosis   . Osteoarthritis   . Gallstones     per [place] 11-2008, also no AAA  . CHF (congestive heart failure)   . Pacemaker     Medications:   (Not in a hospital admission)   Admit Complaint: 79 y.o.  female  admitted 06/13/2012 with AMS.  Pharmacy consulted to dose vancomycin and zosyn.  PMH: HTN, hyperlipidemia, HF, pacer, PVD, GERD, OA  Assessment: Infectious Disease:  Afeb, WBC 7.6,  Antibiotics: 6/1 Vanc, Zosyn   Goal of Therapy:  Vancomycin trough level 15-20 mcg/ml; Renal adjustment of antibiotics.  Plan:  1. Vancomycin 1 g IV q24h 2. Zosyn 3.375g IV q8h infuse over 4h> spoke with Dr Jomarie Longs about PCN allergy, change to ceftazidime 1g q12h. 3. Follow up SCr, UOP, cultures, clinical course and adjust as clinically indicated.    Thank you for  allowing pharmacy to be a part of this patients care team.  Lovenia Kim Pharm.D., BCPS Clinical Pharmacist 06/13/2012 7:46 PM Pager: [phone] Phone: [phone]

## 2012-06-13 NOTE — ED Provider Notes (Signed)
History     CSN: [id number]  Arrival date & time 06/13/12  1707   First MD Initiated Contact with Patient 06/13/12 1716      Chief Complaint  Patient presents with  . Altered Mental Status    (Consider location/radiation/quality/duration/timing/severity/associated sxs/prior treatment) Patient is a 79 y.o. female presenting with altered mental status. The history is provided by the nursing home and the EMS personnel. The history is limited by the condition of the patient (Altered mental status).  Altered Mental Status She was sent from nursing home because of altered mentation. EMS noted severe hypoxia and bradycardia. Oxygen saturation improved with being placed on a nonrebreather mask. Heart rate has come up. She was recently discharged from the hospital following altered mental status and urinary tract infection.     Past Medical History  Diagnosis Date  . Hypertension   . Hyperlipidemia   . Cardiomyopathy   . Pacemaker     [employer] [id number]  . PVD (peripheral vascular disease) 2007    stenting b legs 2007,   . Allergic rhinitis   . GERD (gastroesophageal reflux disease)   . Osteoporosis   . Osteoarthritis   . Gallstones     per [place] 11-2008, also no AAA  . CHF (congestive heart failure)   . Pacemaker     Past Surgical History  Procedure Laterality Date  . Pacemaker insertion      permanent   . Breast biopsy benign      Family History  Problem Relation Age of Onset  . Hypertension    . Breast cancer Neg Hx   . Colon cancer Neg Hx     History  Substance Use Topics  . Smoking status: Former Games developer  . Smokeless tobacco: Not on file  . Alcohol Use: No    OB History   Grav Para Term Preterm Abortions TAB SAB Ect Mult Living                  Review of Systems  Unable to perform ROS: Mental status change  Psychiatric/Behavioral: Positive for altered mental status.    Allergies  Amlodipine; Hydrocodone; and Penicillins  Home Medications    Current Outpatient Rx  Name  Route  Sig  Dispense  Refill  . aspirin EC 81 MG tablet   Oral   Take 81 mg by mouth daily.           Marland Kitchen LORazepam (ATIVAN) 2 MG/ML injection   Intravenous   Inject 0.5 mg into the vein every 12 (twelve) hours as needed for anxiety.         . memantine (NAMENDA) 10 MG tablet   Oral   Take 10 mg by mouth 2 (two) times daily.          . Menthol, Topical Analgesic, (BIOFREEZE) 4 % GEL   Topical   Apply 1 application topically 4 (four) times daily as needed. For pain.         Marland Kitchen Olopatadine HCl (PATADAY) 0.2 % SOLN   Both Eyes   Place 1 drop into both eyes daily.         . polyethylene glycol (MIRALAX / GLYCOLAX) packet   Oral   Take 17 g by mouth every other day.          Marland Kitchen Propylene Glycol 0.6 % SOLN   Both Eyes   Place 1 drop into both eyes 3 (three) times daily.         Marland Kitchen  risperiDONE (RISPERDAL) 1 MG/ML oral solution   Oral   Take 0.5 mg by mouth 2 (two) times daily.         Marland Kitchen saccharomyces boulardii (FLORASTOR) 250 MG capsule   Oral   Take 250 mg by mouth 2 (two) times daily. 14 day course of therapy started 06/01/12.           BP 80/42  Pulse 99  Resp 22  SpO2 96%  Physical Exam  Nursing note and vitals reviewed.  Ill appearing 79 year old female, who is ill-appearing and poorly responsive, but his in no acute distress. She will blink on command the wound not follow any other commands. She does not respond to painful stimuli. Vital signs are significant for hypertension with blood pressure 80/42, and tachypnea with respiratory rate of 22. Oxygen saturation is 96%, which is normal, but is while she is on a nonrebreather mask. Head is normocephalic and atraumatic. PERRLA, eyes are disconjugate and that she will not track objects but doll's eye maneuver does indicate full EOM. Oropharynx is clear. Neck is nontender and supple without adenopathy or JVD. Back is nontender and there is no CVA tenderness. Lungs are clear  without rales, wheezes, or rhonchi. Breath sounds are distant. Chest is nontender. Heart has regular rate and rhythm without murmur. Abdomen is soft, flat, nontender without masses or hepatosplenomegaly and peristalsis is normoactive. Extremities have no cyanosis or edema, full range of motion is present. Skin is warm and dry without rash. Neurologic: mental status as noted above. She does not have any spontaneous movements but there are no gross motor deficits and deep tendon reflexes are symmetric. Plantar response is flexor. .  ED Course  Procedures (including critical care time)  Labs Reviewed  POCT I-STAT 3, BLOOD GAS (G3+) - Abnormal; Notable for the following:    pCO2 arterial 34.3 (*)    Bicarbonate 19.5 (*)    Acid-base deficit 5.0 (*)    All other components within normal limits  URINE CULTURE  BASIC METABOLIC PANEL  TROPONIN I  CBC WITH DIFFERENTIAL  URINALYSIS, ROUTINE W REFLEX MICROSCOPIC   No results found.   Date: 06/13/2012  Rate: 75  Rhythm: Electronic ventricular pacemaker  QRS Axis: left  Intervals: normal  ST/T Wave abnormalities: normal  Conduction Disutrbances:none  Narrative Interpretation: Electronic ventricular pacemaker. When compared with ECG of 06/09/2012, no significant changes are seen.  Old EKG Reviewed: unchanged  1. Altered mental status   2. Hypoxia   3. Hypotension   4. Hyponatremia   5. Renal insufficiency   6. Elevated lactic acid level     CRITICAL CARE Performed by: FAOZH,YQMVH Total critical care time: 60 minutes Critical care time was exclusive of separately billable procedures and treating other patients. Critical care was necessary to treat or prevent imminent or life-threatening deterioration. Critical care was time spent personally by me on the following activities: development of treatment plan with patient and/or surrogate as well as nursing, discussions with consultants, evaluation of patient's response to treatment,  examination of patient, obtaining history from patient or surrogate, ordering and performing treatments and interventions, ordering and review of laboratory studies, ordering and review of radiographic studies, pulse oximetry and re-evaluation of patient's condition.   MDM  Altered mental status with hypotension worrisome for E. there cardiac event her sepsis. Her son has been contacted and he does have power of attorney and confirms that she should be DO NOT RESUSCITATE and DO NOT INTUBATE. Initial treatment is given  with fluid bolus and blood pressure has come up. Oxygen levels are maintained adequate while on nonrebreather oxygen mask. Old records are reviewed and she had been discharged on ciprofloxacin for urinary tract infection, but culture has come back with no growth.  Blood pressure has remained above 100. Lactic acid does come back mildly elevated. Potassium is 6.0 but present was hemolyzed and states that show clinical evidence of hyperkalemia. Creatinine has risen significantly in just a few days. She shows no obvious source of infection so antibiotics were not started. However, a chest x-ray should be repeated in the morning since she may have a pneumonia which would manifest itself after fluid loading. Case is discussed with Dr. Jomarie Longs of [facility] who agrees to admit the patient to step down unit.        Dione Booze, MD 06/13/12 [phone]

## 2012-06-14 ENCOUNTER — Inpatient Hospital Stay (HOSPITAL_COMMUNITY): Payer: Medicare Other

## 2012-06-14 DIAGNOSIS — R627 Adult failure to thrive: Secondary | ICD-10-CM | POA: Diagnosis present

## 2012-06-14 DIAGNOSIS — E860 Dehydration: Secondary | ICD-10-CM | POA: Diagnosis present

## 2012-06-14 DIAGNOSIS — IMO0002 Reserved for concepts with insufficient information to code with codable children: Secondary | ICD-10-CM

## 2012-06-14 DIAGNOSIS — Z515 Encounter for palliative care: Secondary | ICD-10-CM

## 2012-06-14 DIAGNOSIS — R8290 Unspecified abnormal findings in urine: Secondary | ICD-10-CM | POA: Diagnosis present

## 2012-06-14 DIAGNOSIS — R451 Restlessness and agitation: Secondary | ICD-10-CM

## 2012-06-14 LAB — BASIC METABOLIC PANEL
BUN: 38 mg/dL — ABNORMAL HIGH (ref 6–23)
Calcium: 8.6 mg/dL (ref 8.4–10.5)
GFR calc Af Amer: 57 mL/min — ABNORMAL LOW (ref >90)
GFR calc non Af Amer: 49 mL/min — ABNORMAL LOW (ref >90)
Potassium: 5.4 mEq/L — ABNORMAL HIGH (ref 3.5–5.1)
Sodium: 130 mEq/L — ABNORMAL LOW (ref 135–145)

## 2012-06-14 LAB — TROPONIN I: Troponin I: <0.3 ng/mL (ref <0.30)

## 2012-06-14 LAB — CBC
HCT: 31.8 % — ABNORMAL LOW (ref 36.0–46.0)
MCH: 29.3 pg (ref 26.0–34.0)
MCHC: 34.6 g/dL (ref 30.0–36.0)
RDW: 13.6 % (ref 11.5–15.5)

## 2012-06-14 MED ORDER — SODIUM POLYSTYRENE SULFONATE 15 GM/60ML PO SUSP
15.0000 g | Freq: Once | ORAL | Status: AC
  Administered 2012-06-14: 15 g via RECTAL
  Filled 2012-06-14: qty 60

## 2012-06-14 NOTE — Evaluation (Signed)
Clinical/Bedside Swallow Evaluation Patient Details  Name: [name] MRN: [id number] Date of Birth: January 16, 1922  Today's Date: 06/14/2012 Time: 1157-1209 SLP Time Calculation (min): 12 min  Past Medical History:  Past Medical History  Diagnosis Date  . Hypertension   . Hyperlipidemia   . Cardiomyopathy   . Pacemaker     [employer] [id number]  . PVD (peripheral vascular disease) 2007    stenting b legs 2007,   . Allergic rhinitis   . GERD (gastroesophageal reflux disease)   . Osteoporosis   . Osteoarthritis   . Gallstones     per [place] 11-2008, also no AAA  . CHF (congestive heart failure)   . Pacemaker    Past Surgical History:  Past Surgical History  Procedure Laterality Date  . Pacemaker insertion      permanent   . Breast biopsy benign     HPI:  79 y.o. female with h/o dementia, agitation, bed bound, resident of SNF is brought to the ER with hypotension/hypoxia, just discharged from [facility] two days prior after dx of UTI/encephalopathy.  During that admission, pt was seen by SLP for clinical swallow eval.  Swallow was found to be functional and pt resumed mechanical soft diet with thin liquids.       Assessment / Plan / Recommendation Clinical Impression  Pt's swallow function appears clinically consistent with findings of recent bedside swallow eval during [facility] admission.  Pt appears to be protecting her airway during PO intake; however, she complains of pain upon swallowing, and oral exam reveals white patches on ant-mid tongue and on posterior pharyngeal wall.  Recommend resuming dysphagia 3 diet with thin liquids; give meds whole with puree.  No further SLP f/u warranted.    Aspiration Risk  Mild    Diet Recommendation Dysphagia 3 (Mechanical Soft);Thin liquid   Liquid Administration via: Cup Medication Administration: Whole meds with puree Supervision:  (assist with feeding as needed.) Compensations: Slow rate;Small sips/bites Postural Changes and/or Swallow  Maneuvers: Seated upright 90 degrees    Other  Recommendations Oral Care Recommendations: Oral care BID;Staff/trained caregiver to provide oral care   Follow Up Recommendations  Skilled Nursing facility    Frequency and Duration        Pertinent Vitals/Pain Pain in oral cavity   SLP Swallow Goals     Swallow Study Prior Functional Status       General HPI: 79 y.o. female with h/o dementia, agitation, bed bound, resident of SNF is brought to the ER with hypotension/hypoxia, just discharged from [facility] two days prior after dx of UTI/encephalopathy.  During that admission, pt was seen by SLP for clinical swallow eval.  Swallow was found to be functional and pt resumed mechanical soft diet with thin liquids.     Type of Study: Bedside swallow evaluation Previous Swallow Assessment: 06/10/12 clinical swallow eval at [facility] and dysphagia f/u 5/30 with recs for dysphagia 3, thin liquids Diet Prior to this Study: NPO Temperature Spikes Noted: No Respiratory Status: Supplemental O2 delivered via (comment) (nasal cannula) History of Recent Intubation: No Behavior/Cognition: Alert;Cooperative;Pleasant mood Oral Cavity - Dentition: Dentures, top;Dentures, bottom Self-Feeding Abilities: Needs assist Patient Positioning: Upright in bed Baseline Vocal Quality: Clear Volitional Cough: Weak Volitional Swallow: Able to elicit    Oral/Motor/Sensory Function Overall Oral Motor/Sensory Function: Appears within functional limits for tasks assessed   Ice Chips Ice chips: Within functional limits Presentation: Spoon   Thin Liquid Thin Liquid: Impaired Pharyngeal  Phase Impairments: Multiple swallows  Nectar Thick Nectar Thick Liquid: Not tested   Honey Thick Honey Thick Liquid: Not tested   Puree Puree: Within functional limits Presentation: Spoon   Solid   GO    Solid: Within functional limits      [name] L. Samson Frederic, [place] CCC/SLP Pager [phone]  Blenda Mounts Laurice 06/14/2012,12:19  PM

## 2012-06-14 NOTE — Progress Notes (Signed)
[facility] Progress Note [name] TEAM 1 - Stepdown/ICU TEAM   PARI LOMBARD ZOX:[id number] DOB: 07/22/1922 DOA: 06/13/2012 PCP: Bufford Spikes, DO  Brief narrative: 79 year old female patient with what appears to be end-stage dementia with agitation features. Of late she has been bed bound and resides at a nursing facility. 2 days previously she was discharged from [facility] after being treated for urinary tract infection with associated metabolic encephalopathy. Urine culture during that admission was negative for any bacterial growth. She was sent back to the hospital for hypotension and hypoxemia. Of note while in the emergency department she apparently had an episode of diarrhea. When EMS arrived to the facility she was also hypotensive with a systolic blood pressure of 85 and O2 sats in the 80s. Her blood pressure improved to greater than 100 systolic after fluid challenges and oxygen saturations improved after application of 100% oxygen. She appeared quite dehydrated with a lactic acid of 3.3 and she was hyponatremic with hyperkalemia consistent with probable volume depletion.  Assessment/Plan:    Dementia/FTT (failure to thrive) in adult -agree with admitting MD plan for Palliative care meeting to establish GOC-planned for 2:30 pm today    Hypotension/ Dehydration -suspect primarily due to dehydration but considering sepsis in defferential as well -episodic diarrhea in ER- stool for C.Diff PCR negative (recent anbx's in past week)   Acute renal failure/hyperkalemia -suspect due to [facility]- did receive Kayexalate-follow lytes   Acute respiratory failure with hypoxia -initial CXR unremarkable but could change after hydration -repeat CXR in am      Abnormal urinalysis  -doubt UTI -cont empiric anbx's for now    History of cardiomyopathy -recent ECHO 5/29 shows preserved LV fnx  DVT prophylaxis: Lovenox Code Status: DO NOT RESUSCITATE Family Communication: Patient  niece at bedside Disposition Plan: Remain in step down until goals of care meeting completed Nutritional Status: Suspected poor with likely severe protein calorie malnutrition in setting of patient with end-stage dementia and adult failure to thrive  Consultants: Palliative medicine  Procedures: None  Antibiotics: Elita Quick 6/1 >>> Vancomycin 6/1 >>>  HPI/Subjective: Patient unresponsive. Family at bedside.  Objective: Blood pressure 143/59, pulse 76, temperature 97.8 F (36.6 C), temperature source Axillary, resp. rate 17, height 5\' 4"  (1.626 m), weight 71.2 kg (156 lb 15.5 oz), SpO2 100.00%.  Intake/Output Summary (Last 24 hours) at 06/14/12 1239 Last data filed at 06/14/12 1156  Gross per 24 hour  Intake   1400 ml  Output    875 ml  Net    525 ml   Exam: General: No acute respiratory distress Lungs: Clear to auscultation bilaterally without wheezes or crackles but very diminished respiratory effort, 2 L Cardiovascular: Regular rate and rhythm without murmur gallop or rub normal S1 and S2, no peripheral edema or JVD Abdomen: Nontender, nondistended, soft, bowel sounds positive, no rebound, no ascites, no appreciable mass Musculoskeletal: No significant cyanosis, clubbing of bilateral lower extremities Neurological: Unresponsive  Scheduled Meds: Scheduled Meds: . aspirin EC  81 mg Oral Daily  . cefTAZidime (FORTAZ)  IV  1 g Intravenous Q12H  . enoxaparin (LOVENOX) injection  30 mg Subcutaneous Q24H  . memantine  10 mg Oral BID  . olopatadine  1 drop Both Eyes Daily  . vancomycin  1,000 mg Intravenous Q24H    Data Reviewed: Basic Metabolic Panel:  Recent Labs Lab 06/10/12 0520 06/11/12 0444 06/13/12 1750 06/13/12 2218 06/14/12 0510  NA 134* 133* 128* 129* 130*  K 4.7 4.6 6.0* 5.4*  5.4*  CL 101 101 97 96 101  CO2 22 24 21 20 20   GLUCOSE 85 79 128* 148* 106*  BUN 56* 38* 37* 40* 38*  CREATININE 1.17* 0.80 1.30* 1.29* 0.99  CALCIUM 9.2 8.9 9.1 9.3 8.6    CBC:  Recent Labs Lab 06/09/12 2135 06/10/12 0520 06/13/12 1750 06/14/12 0510  WBC 7.8 8.4 7.6 8.0  NEUTROABS 5.1  --  5.8  --   HGB 13.0 11.9* 12.1 11.0*  HCT 38.0 36.0 34.8* 31.8*  MCV 85.4 85.9 86.1 84.8  PLT 210 210 234 226   Cardiac Enzymes:  Recent Labs Lab 06/10/12 0520 06/10/12 1046 06/13/12 1750 06/13/12 2056 06/14/12 0142  TROPONINI <0.30 <0.30 <0.30 <0.30 <0.30   BNP (last 3 results)  Recent Labs  06/09/12 2325  PROBNP 506.8*   CBG:  Recent Labs Lab 06/09/12 1938  GLUCAP 97    Recent Results (from the past 240 hour(s))  URINE CULTURE     Status: None   Collection Time    06/09/12  8:33 PM      Result Value Range Status   Specimen Description URINE, CLEAN CATCH   Final   Special Requests NONE   Final   Culture  Setup Time 06/10/2012 07:53   Final   Colony Count NO GROWTH   Final   Culture NO GROWTH   Final   Report Status 06/11/2012 FINAL   Final  MRSA PCR SCREENING     Status: None   Collection Time    06/10/12  1:16 AM      Result Value Range Status   MRSA by PCR NEGATIVE  NEGATIVE Final   Comment:            The GeneXpert MRSA Assay (FDA     approved for NASAL specimens     only), is one component of a     comprehensive MRSA colonization     surveillance program. It is not     intended to diagnose MRSA     infection nor to guide or     monitor treatment for     MRSA infections.  CLOSTRIDIUM DIFFICILE BY PCR     Status: None   Collection Time    06/13/12  6:37 PM      Result Value Range Status   C difficile by pcr NEGATIVE  NEGATIVE Final  MRSA PCR SCREENING     Status: None   Collection Time    06/13/12  8:55 PM      Result Value Range Status   MRSA by PCR NEGATIVE  NEGATIVE Final   Comment:            The GeneXpert MRSA Assay (FDA     approved for NASAL specimens     only), is one component of a     comprehensive MRSA colonization     surveillance program. It is not     intended to diagnose MRSA     infection nor to  guide or     monitor treatment for     MRSA infections.     Studies:  Recent x-ray studies have been reviewed in detail by the Attending Physician  Scheduled Meds:  Reviewed in detail by the Attending Physician   Junious Silk, ANP [facility] Office  [phone] Pager [phone]  On-Call/Text Page:      [email or web address]      password [email or web address]  If 7PM-7AM, please contact night-coverage [email or web address] Password [facility] 06/14/2012, 12:39 PM  LOS: 1 day   I have personally examined this patient and reviewed the entire database. I have reviewed the above note, made any necessary editorial changes, and agree with its content.  Lonia Blood, MD [facility]

## 2012-06-14 NOTE — Progress Notes (Signed)
Thank you for consulting the Palliative Medicine Team at [facility] to meet your patient's and family's needs.   The reason that you asked [place] to see your patient is for goals of care discussion  We have scheduled your patient for a meeting: "Tentatively" today, Monday 06/14/12 @ 2-2:30 pm - Niece wants to confirm with her brother and call back to team phone with confirmation  The Surrogate decision maker is: niece [name] [phone]; [phone]  Other family members that need to be present: nephew/Flossie's brother- Shelia Media [phone]  Your patient is able/unable to participate: unable to participate -patient sleeping on visit, appears comfortable- did not wake to gentle touch or voice  Additional Narrative: patient admitted with AMS, hypotension h/o Dementia, agitation, bed bound, resident of SNF bedbound pta;discharged 2 days ago from [facility] 2 treated with Ciprofloxacin for UTi and metabolic encephalopathy   Valente David, RN 06/14/2012, 9:50 AM Palliative Medicine Team RN Liaison [phone]

## 2012-06-14 NOTE — Consult Note (Signed)
Patient [name]      DOB: 10-19-1922      QMV:[id number]     Consult Note from the Palliative Medicine Team at [facility]    Consult Requested by: Dr [name]     PCP: Ashley Spikes, DO Reason for Consultation:Goals of Care     Phone Number:[phone]  Assessment of patients Current state: Per staff patient was extremely agitated earlier this afternoon, required Ativan. Currently somewhat lethargic, does respond to some simple questions, but is disoriented, without judgement or insight. Respirations unlabored, oxygen saturation 100% on oxygen via nasal cannula  Reviewed chart, spoke with staff and proceed to have meeting with patients niece, [name]) and patients nephew, [name]. Family indicated that patient has been resident of [facility] for past 2 years. They visit her often, and have noticed general decline in functional and mental abilities over past year. Mainly confined to wheelchair, unable to walk without 1-2 assist, incontinent of bowel/bladder. Appetite has been good prior to this hospitalization.   We discussed the progressive nature of dementia in the context of patients decline, and the potential for further future decline.   Discussed the philosophy of palliative care and hospice support and services provided. Also engaged in decision-making with family regarding concepts of Medical Orders for Scope of Treatment pertaining to cardiac and pulmonary resuscitation, desire for acute future medical interventions for issues, the use of antibiotic therapy, intravenous hydration and continuing artificial tube feedings. A MOST form was completed and placed on chart with DNR/DNI form.  At this time niece/nephew would like patient brought to [facility] for any acute medical issues that require evaluation and treatment. They are aware of potential for decline and understand that future decline may warrant transition to full comfort approach with  hospice support. They would be agreeable for Palliative Care services follow upon discharge back to SNF.They would like to continue current level of medical intervention for identified medical problems.   Goals of Care: 1.  Code Status:DNR/DNI   2. Scope of Treatment: 1. Vital Signs:routine 2. Respiratory/Oxygen: support as indicated 3. Nutritional Support/Tube Feeds: would NOT want artificial means for nutritional support 4. Antibiotics: desire for identified treatable infections 5. Blood Products: as indicated 6. IVF: for defined period of time 7. Review of Medications to be discontinued: none 8. Labs: as indicated 9. Telemetry: as indicated 10. Consults: spiritual consult placed  4. Disposition:Recommend discharge back to SNF when stable and to have Palliative Care services follow   3. Symptom Management:   1. Anxiety/Agitation: Ativan 0.5 mg IV every 4 hours as needed 2. Pain: Tylenol oral every 6 hours as needed 3. Bowel Regimen:Miralax daily as needed    4. Psychosocial:patient has resided at [facility] for past 2 years, niece [name] (HPOA) and her brother [name]                                        primary family that is closest to patient. Emotional support and education during the Goals of Care                             discussion  5. Spiritual: consulted  Patient Documents Completed or Given: Document Given Completed  Advanced Directives Pkt    MOST  YES  DNR  YES  Gone from [facility]  Hard Choices None available     Brief HPI: 79 yo AAF, with progressive dementia, recently discharged from [facility] last week after admission for suspected UTI and metabolic encephalopathy. Readmitted back from [facility] 06/13/12 due to hypotension and hypoxemia (oxygen saturations in 80's). Currently being treated for dehydration, UTI.    ROS: denies pain, unable to elicit due to patients level of confusion    PMH:  Past Medical History  Diagnosis Date  . Hypertension   .  Hyperlipidemia   . Cardiomyopathy   . Pacemaker     [employer] [id number]  . PVD (peripheral vascular disease) 2007    stenting b legs 2007,   . Allergic rhinitis   . GERD (gastroesophageal reflux disease)   . Osteoporosis   . Osteoarthritis   . Gallstones     per [place] 11-2008, also no AAA  . CHF (congestive heart failure)   . Pacemaker      PSH: Past Surgical History  Procedure Laterality Date  . Pacemaker insertion      permanent   . Breast biopsy benign     I have reviewed the FH and SH and  If appropriate update it with new information. Allergies  Allergen Reactions  . Amlodipine Other (See Comments)    Dizziness & elevated BP  . Hydrocodone Itching  . Penicillins Other (See Comments)    Per MAR   Scheduled Meds: . aspirin EC  81 mg Oral Daily  . cefTAZidime (FORTAZ)  IV  1 g Intravenous Q12H  . enoxaparin (LOVENOX) injection  30 mg Subcutaneous Q24H  . memantine  10 mg Oral BID  . olopatadine  1 drop Both Eyes Daily  . vancomycin  1,000 mg Intravenous Q24H   Continuous Infusions: . sodium chloride 1,000 mL (06/14/12 1036)   PRN Meds:.acetaminophen, acetaminophen, LORazepam, ondansetron (ZOFRAN) IV, polyethylene glycol    BP 143/59  Pulse 76  Temp(Src) 97.8 F (36.6 C) (Axillary)  Resp 17  Ht 5\' 4"  (1.626 m)  Wt 71.2 kg (156 lb 15.5 oz)  BMI 26.93 kg/m2  SpO2 100%   PPS: 30%   Intake/Output Summary (Last 24 hours) at 06/14/12 1552 Last data filed at 06/14/12 1400  Gross per 24 hour  Intake   1800 ml  Output    875 ml  Net    925 ml   LBM: 06/14/12                    Physical Exam:  General:  Lethargic, (previous agitation required Ativan) HEENT:   Chest:  CTA, diminished at bases CVS: RRR (pacer rhythm) Abdomen:non-distended, BS audible Ext: trace pedal edema Neuro: disoriented, agitated at times  Labs: CBC    Component Value Date/Time   WBC 8.0 06/14/2012 0510   RBC 3.75* 06/14/2012 0510   HGB 11.0* 06/14/2012 0510   HCT 31.8*  06/14/2012 0510   PLT 226 06/14/2012 0510   MCV 84.8 06/14/2012 0510   MCH 29.3 06/14/2012 0510   MCHC 34.6 06/14/2012 0510   RDW 13.6 06/14/2012 0510   LYMPHSABS 0.9 06/13/2012 1750   MONOABS 0.3 06/13/2012 1750   EOSABS 0.5 06/13/2012 1750   BASOSABS 0.0 06/13/2012 1750    BMET    Component Value Date/Time   NA 130* 06/14/2012 0510   K 5.4* 06/14/2012 0510   CL 101 06/14/2012 0510   CO2 20 06/14/2012 0510   GLUCOSE 106* 06/14/2012 0510   GLUCOSE 83 08/31/2009 1451   BUN 38* 06/14/2012  0510   CREATININE 0.99 06/14/2012 0510   CALCIUM 8.6 06/14/2012 0510   GFRNONAA 49* 06/14/2012 0510   GFRAA 57* 06/14/2012 0510    CMP     Component Value Date/Time   NA 130* 06/14/2012 0510   K 5.4* 06/14/2012 0510   CL 101 06/14/2012 0510   CO2 20 06/14/2012 0510   GLUCOSE 106* 06/14/2012 0510   GLUCOSE 83 08/31/2009 1451   BUN 38* 06/14/2012 0510   CREATININE 0.99 06/14/2012 0510   CALCIUM 8.6 06/14/2012 0510   PROT 7.3 08/27/2011 1810   ALBUMIN 3.9 08/27/2011 1810   AST 28 08/27/2011 1810   ALT 10 08/27/2011 1810   ALKPHOS 77 08/27/2011 1810   BILITOT 0.5 08/27/2011 1810   GFRNONAA 49* 06/14/2012 0510   GFRAA 57* 06/14/2012 0510    Chest Xray Reviewed/Impressions:06/14/12 IMPRESSION:  Minimal right basilar atelectasis.    CT scan of the Head Reviewed/Impressions:06/09/12 IMPRESSION:  1. No acute intracranial pathology seen on CT.  2. Moderate cortical volume loss and diffuse small vessel ischemic  microangiopathy.   Time In Time Out Total Time Spent with Patient Total Overall Time  2:00p 3:30p 15 min 100 min    Greater than 50%  of this time was spent counseling and coordinating care related to the above assessment and plan.  Freddie Breech, CNS-C Palliative Medicine Team [facility] Team Phone: [phone] Pager: [phone]

## 2012-06-14 NOTE — Progress Notes (Signed)
C diff PCR negative. Contact precautions discontinued.

## 2012-06-14 NOTE — Progress Notes (Signed)
Nutrition Brief Note  Patient identified on the Low [place] Report.  Body mass index is 26.93 kg/(m^2). Patient meets criteria for wnl based on current BMI.   Current diet order is NPO. Labs and medications reviewed. Note hyperkalemia, hyponatremia.   Pt determined to be at nutrition risk via Low [place] report.  Pt admitted with hypoxia. Recently discharged from [facility].  RD notes GOC meeting scheduled for this afternoon.  Discussed with RN who reports somnolence without intake. Pt currently remains NPO, however SLP evaluation conducted this afternoon determined pt appropriate for Dysphagia 3, thin.  No appropriate nutrition interventions at this time as pt remains NPO. If nutrition issues arise, please consult RD.  RD to follow through [facility] meeting and will continue with full assessment and interventions if appropriate.   Loyce Dys, MS RD LDN Clinical Inpatient Dietitian Pager: [phone] Weekend/After hours pager: [phone]

## 2012-06-15 ENCOUNTER — Inpatient Hospital Stay (HOSPITAL_COMMUNITY): Payer: Medicare Other

## 2012-06-15 DIAGNOSIS — Z7401 Bed confinement status: Secondary | ICD-10-CM

## 2012-06-15 DIAGNOSIS — K121 Other forms of stomatitis: Secondary | ICD-10-CM

## 2012-06-15 DIAGNOSIS — R82998 Other abnormal findings in urine: Secondary | ICD-10-CM

## 2012-06-15 DIAGNOSIS — E860 Dehydration: Secondary | ICD-10-CM

## 2012-06-15 DIAGNOSIS — J9600 Acute respiratory failure, unspecified whether with hypoxia or hypercapnia: Principal | ICD-10-CM

## 2012-06-15 LAB — BASIC METABOLIC PANEL
BUN: 25 mg/dL — ABNORMAL HIGH (ref 6–23)
Calcium: 7.8 mg/dL — ABNORMAL LOW (ref 8.4–10.5)
Chloride: 105 mEq/L (ref 96–112)
Creatinine, Ser: 0.84 mg/dL (ref 0.50–1.10)
GFR calc Af Amer: 70 mL/min — ABNORMAL LOW (ref >90)
GFR calc non Af Amer: 60 mL/min — ABNORMAL LOW (ref >90)

## 2012-06-15 LAB — URINE CULTURE

## 2012-06-15 MED ORDER — WHITE PETROLATUM GEL
Status: AC
  Administered 2012-06-15: 14:00:00
  Filled 2012-06-15: qty 5

## 2012-06-15 MED ORDER — ENOXAPARIN SODIUM 40 MG/0.4ML ~~LOC~~ SOLN
40.0000 mg | SUBCUTANEOUS | Status: DC
  Administered 2012-06-16: 40 mg via SUBCUTANEOUS
  Filled 2012-06-15 (×2): qty 0.4

## 2012-06-15 MED ORDER — MAGIC MOUTHWASH W/LIDOCAINE
1.0000 mL | Freq: Four times a day (QID) | ORAL | Status: DC
  Administered 2012-06-15 – 2012-06-16 (×3): 1 mL via ORAL
  Filled 2012-06-15 (×7): qty 5

## 2012-06-15 MED ORDER — ENSURE COMPLETE PO LIQD
237.0000 mL | Freq: Two times a day (BID) | ORAL | Status: DC
  Administered 2012-06-15 – 2012-06-16 (×2): 237 mL via ORAL

## 2012-06-15 NOTE — Progress Notes (Signed)
[facility] Progress Note [place] TEAM 1 - Stepdown/ICU TEAM   EMILYROSE DARRAH ZOX:[id number] DOB: 09-Aug-1922 DOA: 06/13/2012 PCP: Bufford Spikes, DO  Brief narrative: 79 year old female patient with what appears to be end-stage dementia with agitation features. Of late she has been bed bound and resides at a nursing facility. 2 days previously she was discharged from [facility] after being treated for urinary tract infection with associated metabolic encephalopathy. Urine culture during that admission was negative for any bacterial growth. She was sent back to the hospital for hypotension and hypoxemia. Of note while in the emergency department she apparently had an episode of diarrhea. When EMS arrived to the facility she was also hypotensive with a systolic blood pressure of 85 and O2 sats in the 80s. Her blood pressure improved to greater than 100 systolic after fluid challenges and oxygen saturations improved after application of 100% oxygen. She appeared quite dehydrated with a lactic acid of 3.3 and she was hyponatremic with hyperkalemia consistent with probable volume depletion.  Assessment/Plan:    Dementia/FTT (failure to thrive) in adult -had agitation overnight requiring sedation -stable this am -GOC completed- at this point will continue current treatments with plans to return to SNF  -D3 diet    Hypotension/ Dehydration -suspect primarily due to dehydration -episodic diarrhea in ER- stool for C.Diff PCR negative (recent anbx's in past week)   Acute renal failure/hyperkalemia -suspect due to [facility]- did receive Kayexalate-follow lytes   Acute respiratory failure with hypoxia -initial CXR unremarkable but could change after hydration -repeat CXR 6/3 without signs of PNA so can dc anbx's      Abnormal urinalysis  -doubt UTI -discontinue empiric anbx's since cx negative    History of cardiomyopathy -recent ECHO 5/29 shows preserved LV fnx  DVT prophylaxis:  Lovenox Code Status: DO NOT RESUSCITATE Family Communication: Patient niece at bedside Disposition Plan: Transfer to floor Nutritional Status: Suspected poor with likely severe protein calorie malnutrition in setting of patient with end-stage dementia and adult failure to thrive  Consultants: Palliative medicine  Procedures: Bilateral lower extremity venous duplex studies pending  Antibiotics: Elita Quick 6/1 >>> 6/3 Vancomycin 6/1 >>> 6/3  HPI/Subjective: Patient unresponsive. Family at bedside.  Objective: Blood pressure 119/38, pulse 73, temperature 99.1 F (37.3 C), temperature source Axillary, resp. rate 18, height 5\' 4"  (1.626 m), weight 71.2 kg (156 lb 15.5 oz), SpO2 100.00%.  Intake/Output Summary (Last 24 hours) at 06/15/12 1325 Last data filed at 06/15/12 0700  Gross per 24 hour  Intake   1950 ml  Output    900 ml  Net   1050 ml   Exam: General: No acute respiratory distress Lungs: Clear to auscultation bilaterally without wheezes or crackles but very diminished respiratory effort, 2 L Cardiovascular: Regular rate and rhythm without murmur gallop or rub normal S1 and S2, no peripheral edema or JVD Abdomen: Nontender, nondistended, soft, bowel sounds positive, no rebound, no ascites, no appreciable mass Musculoskeletal: No significant cyanosis, clubbing of bilateral lower extremities Neurological: Briefly awakens but only mumbles short responses, does not FSC, no apparent focal abnormalities  Scheduled Meds: Scheduled Meds: . aspirin EC  81 mg Oral Daily  . cefTAZidime (FORTAZ)  IV  1 g Intravenous Q12H  . enoxaparin (LOVENOX) injection  30 mg Subcutaneous Q24H  . magic mouthwash w/lidocaine  1 mL Oral QID  . memantine  10 mg Oral BID  . olopatadine  1 drop Both Eyes Daily  . vancomycin  1,000 mg Intravenous Q24H  .  white petrolatum        Data Reviewed: Basic Metabolic Panel:  Recent Labs Lab 06/11/12 0444 06/13/12 1750 06/13/12 2218 06/14/12 0510  06/15/12 0330  NA 133* 128* 129* 130* 135  K 4.6 6.0* 5.4* 5.4* 4.1  CL 101 97 96 101 105  CO2 24 21 20 20 22   GLUCOSE 79 128* 148* 106* 73  BUN 38* 37* 40* 38* 25*  CREATININE 0.80 1.30* 1.29* 0.99 0.84  CALCIUM 8.9 9.1 9.3 8.6 7.8*   CBC:  Recent Labs Lab 06/09/12 2135 06/10/12 0520 06/13/12 1750 06/14/12 0510  WBC 7.8 8.4 7.6 8.0  NEUTROABS 5.1  --  5.8  --   HGB 13.0 11.9* 12.1 11.0*  HCT 38.0 36.0 34.8* 31.8*  MCV 85.4 85.9 86.1 84.8  PLT 210 210 234 226   Cardiac Enzymes:  Recent Labs Lab 06/10/12 0520 06/10/12 1046 06/13/12 1750 06/13/12 2056 06/14/12 0142  TROPONINI <0.30 <0.30 <0.30 <0.30 <0.30   BNP (last 3 results)  Recent Labs  06/09/12 2325  PROBNP 506.8*   CBG:  Recent Labs Lab 06/09/12 1938  GLUCAP 97    Recent Results (from the past 240 hour(s))  URINE CULTURE     Status: None   Collection Time    06/09/12  8:33 PM      Result Value Range Status   Specimen Description URINE, CLEAN CATCH   Final   Special Requests NONE   Final   Culture  Setup Time 06/10/2012 07:53   Final   Colony Count NO GROWTH   Final   Culture NO GROWTH   Final   Report Status 06/11/2012 FINAL   Final  MRSA PCR SCREENING     Status: None   Collection Time    06/10/12  1:16 AM      Result Value Range Status   MRSA by PCR NEGATIVE  NEGATIVE Final   Comment:            The GeneXpert MRSA Assay (FDA     approved for NASAL specimens     only), is one component of a     comprehensive MRSA colonization     surveillance program. It is not     intended to diagnose MRSA     infection nor to guide or     monitor treatment for     MRSA infections.  URINE CULTURE     Status: None   Collection Time    06/13/12  5:29 PM      Result Value Range Status   Specimen Description URINE, CLEAN CATCH   Final   Special Requests NONE   Final   Culture  Setup Time 06/14/2012 02:36   Final   Colony Count NO GROWTH   Final   Culture NO GROWTH   Final   Report Status  06/14/2012 FINAL   Final  CLOSTRIDIUM DIFFICILE BY PCR     Status: None   Collection Time    06/13/12  6:37 PM      Result Value Range Status   C difficile by pcr NEGATIVE  NEGATIVE Final  CULTURE, BLOOD (ROUTINE X 2)     Status: None   Collection Time    06/13/12  7:35 PM      Result Value Range Status   Specimen Description BLOOD LEFT HAND   Final   Special Requests BOTTLES DRAWN AEROBIC ONLY 2CC   Final   Culture  Setup Time 06/14/2012 02:05   Final  Culture     Final   Value:        BLOOD CULTURE RECEIVED NO GROWTH TO DATE CULTURE WILL BE HELD FOR 5 DAYS BEFORE ISSUING A FINAL NEGATIVE REPORT   Report Status PENDING   Incomplete  CULTURE, BLOOD (ROUTINE X 2)     Status: None   Collection Time    06/13/12  7:55 PM      Result Value Range Status   Specimen Description BLOOD LEFT ARM   Final   Special Requests BOTTLES DRAWN AEROBIC ONLY 2CC   Final   Culture  Setup Time 06/14/2012 02:05   Final   Culture     Final   Value:        BLOOD CULTURE RECEIVED NO GROWTH TO DATE CULTURE WILL BE HELD FOR 5 DAYS BEFORE ISSUING A FINAL NEGATIVE REPORT   Report Status PENDING   Incomplete  MRSA PCR SCREENING     Status: None   Collection Time    06/13/12  8:55 PM      Result Value Range Status   MRSA by PCR NEGATIVE  NEGATIVE Final   Comment:            The GeneXpert MRSA Assay (FDA     approved for NASAL specimens     only), is one component of a     comprehensive MRSA colonization     surveillance program. It is not     intended to diagnose MRSA     infection nor to guide or     monitor treatment for     MRSA infections.     Studies:  Recent x-ray studies have been reviewed in detail by the Attending Physician  Scheduled Meds:  Reviewed in detail by the Attending Physician   Junious Silk, ANP [facility] Office  [phone] Pager [phone]  On-Call/Text Page:      [email or web address]      password [email or web address]  If 7PM-7AM, please contact  night-coverage [email or web address] Password [email or web address] 06/15/2012, 1:25 PM   LOS: 2 days    I have examined the patient, reviewed the chart and modified the above note which I agree with.   Mamie Diiorio,MD [phone] 06/15/2012, 5:57 PM

## 2012-06-15 NOTE — Progress Notes (Signed)
Utilization Review Completed. 06/15/2012  

## 2012-06-15 NOTE — Progress Notes (Signed)
*  PRELIMINARY RESULTS* Vascular Ultrasound Lower extremity venous duplex has been completed.  Preliminary findings: bilaterally negative for DVT and baker's cyst.  Farrel Demark, RDMS, RVT  06/15/2012, 1:51 PM

## 2012-06-15 NOTE — Progress Notes (Signed)
INITIAL NUTRITION ASSESSMENT  DOCUMENTATION CODES Per approved criteria  -Not Applicable   INTERVENTION:  Ensure Complete twice daily (350 kcals, 13 gm protein per 8 fl oz bottle) RD to follow for nutrition care plan  NUTRITION DIAGNOSIS: Inadequate oral intake related to dementia, failure to thrive as evidenced by meal tray observation  Goal: Oral intake with meals & supplements to meet >/= 90% of estimated nutrition needs  Monitor:  PO & supplemental intake, weight, labs, I/O's  Reason for Assessment: Low Braden  79 y.o. female  Admitting Dx: hypotension/hypoxia  ASSESSMENT: Patient with h/o dementia, agitation, bed bound, resident of SNF; EMS found her hypotensive at SNF with SBP of 85 and hypoxic with O2 sats in 80s; in ER, BP improved to 124 with IVF, was placed on non-rebreather mask.   Palliative Care Team note reviewed 6/2.  No artificial means for nutritional support desired, however, family not ready to transition to full comfort approach at this time.  RD spoke to patient's daughter at bedside; reports patient was eating well at SNF; no recent weight loss; RD observed lunch tray (patient ate chicken and dumplings); s/p bedside swallow evaluation 6/2; patient agreeable to Straw[employer] Ensure supplements ---> RD to order.  Height: Ht Readings from Last 1 Encounters:  06/13/12 5\' 4"  (1.626 m)    Weight: Wt Readings from Last 1 Encounters:  06/13/12 156 lb 15.5 oz (71.2 kg)    Ideal Body Weight: 120 lb  % Ideal Body Weight: 130%  Wt Readings from Last 10 Encounters:  06/13/12 156 lb 15.5 oz (71.2 kg)  06/11/12 156 lb 12 oz (71.1 kg)  06/01/12 171 lb (77.565 kg)  04/16/12 171 lb (77.565 kg)  02/14/11 138 lb (62.596 kg)  09/26/10 127 lb (57.607 kg)  09/20/10 129 lb 8 oz (58.741 kg)  08/26/10 130 lb (58.968 kg)  07/23/10 136 lb (61.689 kg)  05/16/10 134 lb 12.8 oz (61.145 kg)    Usual Body Weight: 138--171 lbs ? accuracy  % Usual Body Weight:  88%--91%  BMI:  Body mass index is 26.93 kg/(m^2).  Estimated Nutritional Needs: Kcal: 1500-1700 Protein: 70-80 gm Fluid: 1.5-1.7 L  Skin: Stage I pressure ulcer to ankle  Diet Order: Dysphagia 3, thin liquids  EDUCATION NEEDS: -No education needs identified at this time   Intake/Output Summary (Last 24 hours) at 06/15/12 1455 Last data filed at 06/15/12 0700  Gross per 24 hour  Intake   1850 ml  Output    900 ml  Net    950 ml    Last BM: 6/2  Labs:   Recent Labs Lab 06/13/12 2218 06/14/12 0510 06/15/12 0330  NA 129* 130* 135  K 5.4* 5.4* 4.1  CL 96 101 105  CO2 20 20 22   BUN 40* 38* 25*  CREATININE 1.29* 0.99 0.84  CALCIUM 9.3 8.6 7.8*  GLUCOSE 148* 106* 73    Scheduled Meds: . aspirin EC  81 mg Oral Daily  . enoxaparin (LOVENOX) injection  40 mg Subcutaneous Q24H  . magic mouthwash w/lidocaine  1 mL Oral QID  . memantine  10 mg Oral BID  . olopatadine  1 drop Both Eyes Daily    Continuous Infusions: . sodium chloride 100 mL/hr at 06/15/12 0225    Past Medical History  Diagnosis Date  . Hypertension   . Hyperlipidemia   . Cardiomyopathy   . Pacemaker     [employer] [id number]  . PVD (peripheral vascular disease) 2007    stenting b  legs 2007,   . Allergic rhinitis   . GERD (gastroesophageal reflux disease)   . Osteoporosis   . Osteoarthritis   . Gallstones     per [place] 11-2008, also no AAA  . CHF (congestive heart failure)   . Pacemaker     Past Surgical History  Procedure Laterality Date  . Pacemaker insertion      permanent   . Breast biopsy benign      Maureen Chatters, RD, LDN Pager #: [phone] After-Hours Pager #: [phone]

## 2012-06-15 NOTE — Progress Notes (Signed)
Clinical Social Work Department BRIEF PSYCHOSOCIAL ASSESSMENT 06/15/2012  Patient:  [name], [name]     Account Number:  [id number]     Admit date:  06/13/2012  Clinical Social Worker:  Margaree Mackintosh  Date/Time:  06/15/2012 09:30 AM  Referred by:  Physician  Date Referred:  06/15/2012 Referred for  SNF Placement   Other Referral:   Pt admitted from [facility].   Interview type:  Family Other interview type:   Pt not fully alert/oriented.    PSYCHOSOCIAL DATA Living Status:  FACILITY Admitted from facility:  [facility] Level of care:  Skilled Nursing Facility Primary support name:  [name]: [phone] & [phone] Primary support relationship to patient:  FAMILY Degree of support available:   Neice.    CURRENT CONCERNS Current Concerns  Post-Acute Placement   Other Concerns:    SOCIAL WORK ASSESSMENT / PLAN Clinical Social Worker received referral indicating pt is from [facility].  CSW reviewed chart and noted pt not currently fully oriented.  CSW spoke with pt's neice, [name].  CSW introduced self, explained role, and provided support.  CSW reviewed SNF process.  Neice agreeable to pt's return to [facility] once medically stable.  CSW to submit clinical information to SNF.  CSW to continue to follow and assist as needed.   Assessment/plan status:  Information/Referral to [facility] Other assessment/ plan:   Information/referral to community resources:   SNF    PATIENT'S/FAMILY'S RESPONSE TO PLAN OF CARE: Family thanked CSW for intervention.

## 2012-06-15 NOTE — Progress Notes (Addendum)
[name]      DOB: 1922-05-19      UJW:[id number]   Palliative Medicine Team at [facility] Progress Note    Subjective:Patient alert, sitting up in bed, singing church hymns with visitors. Patient able to take small bites of food, sips of juice and water. Responded appropriately to questions regarding her comfort level. Stated that her mouth was sore. No family present at bedside.   Filed Vitals:   06/15/12 1144  BP: 119/38  Pulse: 73  Temp: 99.1 F (37.3 C)  Resp: 18   Physical exam: General: Alert, verbally responsive, appears comfortable HEENT: small ulcerated ares on L lateral tongue surface, painful upon palpation CHEST: CTA bilaterally  CVS: RRR-paced rhythm ABD: soft, non-tender, BS audible GU: foley d/w clear light amber urine NEURO: pleasantly confused, she was able to call me by name    Assessment and plan: 79 yo AAF, with progressive dementia, recently discharged from [facility] last week after admission for suspected UTI and metabolic encephalopathy. Readmitted back from [facility] 06/13/12 due to hypotension and hypoxemia (oxygen saturations in 80's). Currently being treated for dehydration, UTI.   Code Status DNR/DNI  Symptom Management: 1. Anxiety/Agitation: Ativan 0.5 mg IV every 4 hours as needed 2. Pain: Tylenol oral every 6 hours as needed 3. Bowel Regimen:Miralax daily as needed  4. Mouth Ulcerations: Ordered Magic Mouthwash to ulcerated ares 4 times daily       5. Disposition: family hopeful for patient to return to [facility] with Palliative Care services of [place] to follow. Family are realistic with possibility of decline, and are aware of options available for hospice support.  Time In Time Out Total Time Spent with Patient Total Overall Time  11:45a 12:15p 25 min 30 min   Freddie Breech, CNS-C Palliative Medicine Team [facility] Team Phone: [phone] Pager: [phone]

## 2012-06-16 DIAGNOSIS — K1370 Unspecified lesions of oral mucosa: Secondary | ICD-10-CM

## 2012-06-16 MED ORDER — DIPHENHYDRAMINE HCL 50 MG/ML IJ SOLN
12.5000 mg | Freq: Four times a day (QID) | INTRAMUSCULAR | Status: DC | PRN
Start: 2012-06-16 — End: 2012-06-16

## 2012-06-16 MED ORDER — ACETAMINOPHEN 325 MG PO TABS: 650.0000 mg | ORAL_TABLET | Freq: Four times a day (QID) | ORAL | Status: DC | PRN

## 2012-06-16 MED ORDER — SODIUM CHLORIDE 0.9 % IV SOLN: INTRAVENOUS | Status: DC

## 2012-06-16 MED ORDER — ONDANSETRON HCL 4 MG/2ML IJ SOLN: 4.0000 mg | Freq: Four times a day (QID) | INTRAMUSCULAR | Status: DC | PRN

## 2012-06-16 MED ORDER — ENSURE COMPLETE PO LIQD: 237.0000 mL | Freq: Two times a day (BID) | ORAL | Status: DC

## 2012-06-16 MED ORDER — MAGIC MOUTHWASH
5.0000 mL | Freq: Four times a day (QID) | ORAL | Status: DC
  Administered 2012-06-16 (×2): 5 mL via ORAL
  Filled 2012-06-16 (×4): qty 5

## 2012-06-16 NOTE — Progress Notes (Signed)
Clinical Social Worker went back to pt's room to inform pt that Babette Relic will be arriving to discuss concerns.  Pt required this CSW to re-introduce self.  CSW again introduced self and explained role.    Angelia Mould, MSW, [place] [phone]

## 2012-06-16 NOTE — Progress Notes (Signed)
[name]      DOB: [birth year]      UJW:[id number]   Palliative Medicine Team at [facility] Progress Note    Subjective:Patient alert, sitting up in bed, appetite is minimal, but does take fluids orally.  Filed Vitals:   06/16/12 1121  BP: 165/53  Pulse: 81  Temp: 98.5 F (36.9 C)  Resp: 16   Physical exam: General: Alert, responds to simple questions HEENT: tongue eurythermic, few ulcers on left lateral tongue CHEST: CTA bilaterally CVS: RRR ABD: soft, non-tender, BS audible NEURO: confused   Assessment and plan: 79 yo AAF, with progressive dementia, recently discharged from [facility] last week after admission for suspected UTI and metabolic encephalopathy. Readmitted back from [facility] 06/13/12 due to hypotension and hypoxemia (oxygen saturations in 80's). Currently being treated for dehydration, UTI.   Code Status DNR/DNI  Symptom Management:  1. Anxiety/Agitation: Ativan 0.5 mg IV every 4 hours as needed 2. Pain: Tylenol oral every 6 hours as needed 3. Bowel Regimen:Miralax daily as needed  4. Mouth Ulcerations: Ordered Magic Mouthwash to ulcerated ares 4 times daily 5. Disposition:recommend discharge to SNF with Palliative care services of [place] to follow   Time In Time Out Total Time Spent with Patient Total Overall Time  2:00p 2:15p 15 min 15 min   Freddie Breech, CNS-C Palliative Medicine Team [facility] Team Phone: [phone] Pager: [phone]

## 2012-06-16 NOTE — Discharge Summary (Signed)
Physician Discharge Summary  [name] ZOX:[id number] DOB: 20-Aug-1922 DOA: 06/13/2012  PCP: Bufford Spikes, DO  Admit date: 06/13/2012 Discharge date: 06/16/2012  Time spent: >30 minutes  Recommendations for Outpatient Follow-up:  Encourage oral intake of fluids.  Discharge Diagnoses:  Active Problems:   Dementia with FTT (failure to thrive) in adult   Hypotension due to dehydration- resolved   Acute respiratory failure with hypoxia-resolved and due to hypoventilation   Dehydration-resolved   Abnormal urinalysis- urine culture normal   History of cardiomyopathy   Agitation due to dementia   Palliative care encounter   Mouth ulcers   Discharge Condition: stable  Diet recommendation: Dysphagia 3 with thin liquids  Filed Weights   06/13/12 1900 06/13/12 2116 06/16/12 0436  Weight: 71.098 kg (156 lb 11.9 oz) 71.2 kg (156 lb 15.5 oz) 72.5 kg (159 lb 13.3 oz)    History of present illness:  79 year old female patient with what appears to be end-stage dementia with agitation features. Of late she has been bed bound and resides at a nursing facility. 2 days previously she was discharged from [facility] long hospital after being treated for urinary tract infection with associated metabolic encephalopathy. Urine culture during that admission was negative for any bacterial growth. She was sent back to the hospital for hypotension and hypoxemia. Of note while in the emergency department she apparently had an episode of diarrhea. When EMS arrived to the facility she was also hypotensive with a systolic blood pressure of 85 and O2 sats in the 80s. Her blood pressure improved to greater than 100 systolic after fluid challenges and oxygen saturations improved after application of 100% oxygen. She appeared quite dehydrated with a lactic acid of 3.3 and she was hyponatremic with hyperkalemia consistent with probable volume depletion   Hospital Course:  Dementia/FTT (failure to thrive) in adult  -had  agitation requiring intermittent sedation  -stable the morning of discharge -Goals of care completed. Family has decided to continue current treatments after return to SNF but remains DNR. Aware of options for Hospice support. -D3 diet with thin liquids  Hypotension -primarily due to dehydration  -episodic diarrhea in ER- stool for C.Diff PCR negative (recent anbx's in past week)   Acute renal failure/hyperkalemia  -was due to [facility]- did receive Kayexalate-potassium stable after hydration  Acute respiratory failure with hypoxia  -initial CXR unremarkable  -repeat CXR 6/3 without signs of PNA so dc anbx's   Abnormal urinalysis  -urine culture was negative so antibiotics were stopped  History of cardiomyopathy  -recent ECHO 5/29 shows preserved LV fnx  Procedures: Bilateral lower extremity venous duplex studies: Normal   Consultations:  Palliative Medicine  Discharge Exam: Filed Vitals:   06/16/12 0436 06/16/12 0543 06/16/12 0722 06/16/12 1121  BP: 146/50  157/56 165/53  Pulse: 76 79 76 81  Temp: 98 F (36.7 C)  98.9 F (37.2 C) 98.5 F (36.9 C)  TempSrc: Oral  Oral Oral  Resp: 16 19 21 16   Height:      Weight: 72.5 kg (159 lb 13.3 oz)     SpO2: 100% 100% 100% 100%   General: No acute respiratory distress  Lungs: Clear to auscultation bilaterally without wheezes or crackles but very diminished respiratory effort, RA Cardiovascular: Regular rate and rhythm without murmur gallop or rub normal S1 and S2, no peripheral edema or JVD  Abdomen: Nontender, nondistended, soft, bowel sounds positive, no rebound, no ascites, no appreciable mass  Musculoskeletal: No significant cyanosis, clubbing of bilateral lower extremities  Neurological: More alert and talkative today, being fed and per niece she is back to baseline;, no apparent focal abnormalities    Discharge Instructions      Discharge Orders   Future Orders Complete By Expires     Diet general  As directed      Scheduling Instructions:      Dysphagia 3 with thin liquids    Increase activity slowly  As directed         Medication List    STOP taking these medications       ciprofloxacin 500 MG tablet  Commonly known as:  CIPRO      TAKE these medications       acetaminophen 325 MG tablet  Commonly known as:  TYLENOL  Take 2 tablets (650 mg total) by mouth every 6 (six) hours as needed.     aspirin EC 81 MG tablet  Take 81 mg by mouth daily.     BIOFREEZE 4 % Gel  Generic drug:  Menthol (Topical Analgesic)  Apply 1 application topically 4 (four) times daily as needed. For pain.     calcium-vitamin D 500-200 MG-UNIT per tablet  Commonly known as:  OSCAL WITH D  Take 1 tablet by mouth daily.     cyanocobalamin 500 MCG tablet  Take 1,000 mcg by mouth daily.     feeding supplement Liqd  Take 237 mLs by mouth 2 (two) times daily between meals.     LORazepam 2 MG/ML injection  Commonly known as:  ATIVAN  Inject 0.5 mg into the vein every 12 (twelve) hours as needed for anxiety.     memantine 10 MG tablet  Commonly known as:  NAMENDA  Take 10 mg by mouth 2 (two) times daily.     PATADAY 0.2 % Soln  Generic drug:  Olopatadine HCl  Place 1 drop into both eyes daily.     polyethylene glycol packet  Commonly known as:  MIRALAX / GLYCOLAX  Take 17 g by mouth every other day.     Propylene Glycol 0.6 % Soln  Place 1 drop into both eyes 3 (three) times daily.     risperiDONE 1 MG/ML oral solution  Commonly known as:  RISPERDAL  Take 0.5 mg by mouth 2 (two) times daily.     saccharomyces boulardii 250 MG capsule  Commonly known as:  FLORASTOR  Take 250 mg by mouth 2 (two) times daily. 14 day course of therapy started 06/01/12.       Allergies  Allergen Reactions  . Amlodipine Other (See Comments)    Dizziness & elevated BP  . Hydrocodone Itching  . Penicillins Other (See Comments)    Per [facility]     Microbiology: Recent Results (from the past 240 hour(s))  URINE  CULTURE     Status: None   Collection Time    06/09/12  8:33 PM      Result Value Range Status   Specimen Description URINE, CLEAN CATCH   Final   Special Requests NONE   Final   Culture  Setup Time 06/10/2012 07:53   Final   Colony Count NO GROWTH   Final   Culture NO GROWTH   Final   Report Status 06/11/2012 FINAL   Final  MRSA PCR SCREENING     Status: None   Collection Time    06/10/12  1:16 AM      Result Value Range Status   MRSA by PCR NEGATIVE  NEGATIVE Final  Comment:            The GeneXpert MRSA Assay (FDA     approved for NASAL specimens     only), is one component of a     comprehensive MRSA colonization     surveillance program. It is not     intended to diagnose MRSA     infection nor to guide or     monitor treatment for     MRSA infections.  URINE CULTURE     Status: None   Collection Time    06/13/12  5:29 PM      Result Value Range Status   Specimen Description URINE, CLEAN CATCH   Final   Special Requests NONE   Final   Culture  Setup Time     Final   Value: 06/14/2012 02:36  DEMOGRAPHIC UPDATE OCCURRED ON 06/03 AT 1445, QA FLAGS AND RANGES MAY NO LONGER BE VALID   Colony Count     Final   Value: NO GROWTH DEMOGRAPHIC UPDATE OCCURRED ON 06/03 AT 1445, QA FLAGS AND RANGES MAY NO LONGER BE VALID   Culture     Final   Value: NO GROWTH DEMOGRAPHIC UPDATE OCCURRED ON 06/03 AT 1445, QA FLAGS AND RANGES MAY NO LONGER BE VALID   Report Status     Final   Value: 06/14/2012 FINAL DEMOGRAPHIC UPDATE OCCURRED ON 06/03 AT 1445, QA FLAGS AND RANGES MAY NO LONGER BE VALID  CLOSTRIDIUM DIFFICILE BY PCR     Status: None   Collection Time    06/13/12  6:37 PM      Result Value Range Status   C difficile by pcr NEGATIVE  NEGATIVE Final  CULTURE, BLOOD (ROUTINE X 2)     Status: None   Collection Time    06/13/12  7:35 PM      Result Value Range Status   Specimen Description BLOOD LEFT HAND   Final   Special Requests BOTTLES DRAWN AEROBIC ONLY 2CC   Final   Culture   Setup Time     Final   Value: 06/14/2012 02:05  DEMOGRAPHIC UPDATE OCCURRED ON 06/03 AT 1445, QA FLAGS AND RANGES MAY NO LONGER BE VALID   Culture     Final   Value:        BLOOD CULTURE RECEIVED NO GROWTH TO DATE CULTURE WILL BE HELD FOR 5 DAYS BEFORE ISSUING A FINAL NEGATIVE REPORT DEMOGRAPHIC UPDATE OCCURRED ON 06/03 AT 1445, QA FLAGS AND RANGES MAY NO LONGER BE VALID   Report Status PENDING   Incomplete  CULTURE, BLOOD (ROUTINE X 2)     Status: None   Collection Time    06/13/12  7:55 PM      Result Value Range Status   Specimen Description BLOOD LEFT ARM   Final   Special Requests BOTTLES DRAWN AEROBIC ONLY 2CC   Final   Culture  Setup Time     Final   Value: 06/14/2012 02:05  DEMOGRAPHIC UPDATE OCCURRED ON 06/03 AT 1445, QA FLAGS AND RANGES MAY NO LONGER BE VALID   Culture     Final   Value:        BLOOD CULTURE RECEIVED NO GROWTH TO DATE CULTURE WILL BE HELD FOR 5 DAYS BEFORE ISSUING A FINAL NEGATIVE REPORT DEMOGRAPHIC UPDATE OCCURRED ON 06/03 AT 1445, QA FLAGS AND RANGES MAY NO LONGER BE VALID   Report Status PENDING   Incomplete  MRSA PCR SCREENING     Status: None  Collection Time    06/13/12  8:55 PM      Result Value Range Status   MRSA by PCR NEGATIVE  NEGATIVE Final   Comment:            The GeneXpert MRSA Assay (FDA     approved for NASAL specimens     only), is one component of a     comprehensive MRSA colonization     surveillance program. It is not     intended to diagnose MRSA     infection nor to guide or     monitor treatment for     MRSA infections.     Labs: Basic Metabolic Panel:  Recent Labs Lab 06/11/12 0444 06/13/12 1750 06/13/12 2218 06/14/12 0510 06/15/12 0330  NA 133* 128* 129* 130* 135  K 4.6 6.0* 5.4* 5.4* 4.1  CL 101 97 96 101 105  CO2 24 21 20 20 22   GLUCOSE 79 128* 148* 106* 73  BUN 38* 37* 40* 38* 25*  CREATININE 0.80 1.30* 1.29* 0.99 0.84  CALCIUM 8.9 9.1 9.3 8.6 7.8*   CBC:  Recent Labs Lab 06/09/12 2135 06/10/12 0520  06/13/12 1750 06/14/12 0510  WBC 7.8 8.4 7.6 8.0  NEUTROABS 5.1  --  5.8  --   HGB 13.0 11.9* 12.1 11.0*  HCT 38.0 36.0 34.8* 31.8*  MCV 85.4 85.9 86.1 84.8  PLT 210 210 234 226   Cardiac Enzymes:  Recent Labs Lab 06/10/12 0520 06/10/12 1046 06/13/12 1750 06/13/12 2056 06/14/12 0142  TROPONINI <0.30 <0.30 <0.30 <0.30 <0.30   BNP: BNP (last 3 results)  Recent Labs  06/09/12 2325  PROBNP 506.8*   CBG:  Recent Labs Lab 06/09/12 1938  GLUCAP 97    Signed:  ELLIS,ALLISON L. ANP [facility] 06/16/2012, 4:06 PM  I have personally examined this patient and reviewed the entire database. I have reviewed the above note, made any necessary editorial changes, and agree with its content.  Lonia Blood, MD [facility]

## 2012-06-16 NOTE — Progress Notes (Signed)
Clinical Social Worker spoke received referral for [employer] to [facility] today.  CSW reviewed chart and spoke with RN.  CSW met with pt and pt's niece, Flossie, at bedside.  CSW reviewed dc plan with pt and niece.  Pt expressed that she was "upset" with SNF.  CSW inquired if she has discussed issues with SNF.  Pt stated she hasn't yet but that she "doesn't like when I (pt) doesn't know the names of people who work there".  CSW validated concern.  CSW inquired if pt and family would like to find another facility today or discuss concerns with SNF.  Pt agreeable to discuss concerns with SNF.  CSW received permission to speak with SNF about pt's concerns.  CSW  Spoke with Tammy at SNF who is agreeable to meet with pt and niece in hospital room to discuss concerns.  CSW provided SNF list to niece for their review.  CSW to continue to follow and assist as needed.   Angelia Mould, MSW, [place] [phone]

## 2012-06-16 NOTE — Progress Notes (Signed)
Report given to Tai RN on 6700, to transfer via bed to [id number], Berle Mull RN

## 2012-06-16 NOTE — Progress Notes (Signed)
Discharged to [facility] SNF via Sharin Mons, Berle Mull RN

## 2012-06-16 NOTE — Progress Notes (Signed)
Report given to [facility], nurse [place], pt to transport via [facility] back to [facility], Berle Mull RN

## 2012-06-20 LAB — CULTURE, BLOOD (ROUTINE X 2): Culture: NO GROWTH

## 2012-06-25 ENCOUNTER — Telehealth: Payer: Self-pay | Admitting: Internal Medicine

## 2012-06-25 NOTE — Telephone Encounter (Signed)
06-25-12 lm w/transportation and sent second request for September recall to nursing home/mt

## 2012-07-15 ENCOUNTER — Encounter: Payer: Self-pay | Admitting: Nurse Practitioner

## 2012-07-15 ENCOUNTER — Non-Acute Institutional Stay (SKILLED_NURSING_FACILITY): Payer: Medicare Other | Admitting: Nurse Practitioner

## 2012-07-15 DIAGNOSIS — F0390 Unspecified dementia without behavioral disturbance: Secondary | ICD-10-CM

## 2012-07-15 DIAGNOSIS — I10 Essential (primary) hypertension: Secondary | ICD-10-CM

## 2012-07-15 DIAGNOSIS — E871 Hypo-osmolality and hyponatremia: Secondary | ICD-10-CM | POA: Insufficient documentation

## 2012-07-15 DIAGNOSIS — R627 Adult failure to thrive: Secondary | ICD-10-CM

## 2012-07-15 DIAGNOSIS — K219 Gastro-esophageal reflux disease without esophagitis: Secondary | ICD-10-CM

## 2012-07-15 NOTE — Progress Notes (Signed)
Patient ID: [name], female   DOB: 1922-07-21, 79 y.o.   MRN: [id number]   Nursing Home Location:  [facility] [place]   Place of Service: SNF [phone])   Chief Complaint: medical management of chronic conditions   HPI:  79 year old female who is a long term resident at [facility] is being seen for routine visit and is currently without any complaints. Nursing is without any concerns at this time.  290.40-DEMENTIA, VASCULAR, UNCOMPLICATED The Alzheimer's disease symptoms have not changed.The patient has agitation.No complications noted from the medication presently being used.takes namenda 10 mg twice daily takes asa 81 mg daily is followed by psych services.  297.9-DELUSION takes Risperdal 0.5mg  bid since 08/25/11 she is doing well; she has failed being off antipsychotic medication in the past.  401.9-HTN UNSPECIFIED The blood pressure readings taken outside the office since the last visit have been in the target range. No complications noted from the medication presently being used. is taking lisinopril 5 mg daily  564.00-CONSTIPATION The symptoms are stable. Currently the patient is not taking medication for this problem. No other therapies have been tried.takes MiraLax qod.  715.90-OSTEOARTHRITIS The arthritis is stable.No complications noted from the medication presently being used.no complaints of joint pain while taking aleve twice daily and uses biofreeze four times as a day as needed for joint pain.    Review of Systems:  Review of Systems  Constitutional: Negative for malaise/fatigue.  HENT: Negative.   Respiratory: Negative for cough and shortness of breath.   Cardiovascular: Negative for chest pain.  Gastrointestinal: Negative for abdominal pain, diarrhea and constipation.  Genitourinary: Negative for dysuria, urgency and frequency.  Musculoskeletal: Positive for myalgias and joint pain.       Reports pain is not bad  Skin: Negative.   Neurological: Negative for  dizziness and weakness.  Psychiatric/Behavioral: Negative for depression. The patient does not have insomnia.     Medications: Patient's Medications  New Prescriptions   No medications on file  Previous Medications   ACETAMINOPHEN (TYLENOL) 325 MG TABLET    Take 2 tablets (650 mg total) by mouth every 6 (six) hours as needed.   ASPIRIN EC 81 MG TABLET    Take 81 mg by mouth daily.     CALCIUM-VITAMIN D (OSCAL WITH D) 500-200 MG-UNIT PER TABLET    Take 1 tablet by mouth daily.   CYANOCOBALAMIN 500 MCG TABLET    Take 1,000 mcg by mouth daily.   FEEDING SUPPLEMENT (ENSURE COMPLETE) LIQD    Take 237 mLs by mouth 2 (two) times daily between meals.   LORAZEPAM (ATIVAN) 2 MG/ML INJECTION    Inject 0.5 mg into the vein every 12 (twelve) hours as needed for anxiety.   MEMANTINE (NAMENDA) 10 MG TABLET    Take 10 mg by mouth 2 (two) times daily.    MENTHOL, TOPICAL ANALGESIC, (BIOFREEZE) 4 % GEL    Apply 1 application topically 4 (four) times daily as needed. For pain.   OLOPATADINE HCL (PATADAY) 0.2 % SOLN    Place 1 drop into both eyes daily.   POLYETHYLENE GLYCOL (MIRALAX / GLYCOLAX) PACKET    Take 17 g by mouth every other day.    PROPYLENE GLYCOL 0.6 % SOLN    Place 1 drop into both eyes 3 (three) times daily.   RISPERIDONE (RISPERDAL) 1 MG/ML ORAL SOLUTION    Take 0.5 mg by mouth 2 (two) times daily.  Modified Medications   No medications on file  Discontinued Medications   SACCHAROMYCES BOULARDII (FLORASTOR) 250 MG CAPSULE    Take 250 mg by mouth 2 (two) times daily. 14 day course of therapy started 06/01/12.     Physical Exam:  Filed Vitals:   07/15/12 1135  BP: 148/78  Pulse: 73  Temp: 98.5 F (36.9 C)  Resp: 20    Physical Exam  Constitutional: She is well-developed, well-nourished, and in no distress. No distress.  HENT:  Head: Normocephalic and atraumatic.  Mouth/Throat: Oropharynx is clear and moist. No oropharyngeal exudate.  Eyes: Conjunctivae and EOM are normal. Pupils  are equal, round, and reactive to light.  Neck: Normal range of motion. Neck supple.  Cardiovascular: Normal rate, regular rhythm and normal heart sounds.   Pulmonary/Chest: Effort normal and breath sounds normal. No respiratory distress.  Abdominal: Soft. Bowel sounds are normal. She exhibits no distension. There is no tenderness.  Musculoskeletal: She exhibits no edema and no tenderness.  Neurological: She is alert.  Skin: Skin is warm and dry. She is not diaphoretic.      Labs reviewed: 06/07/12: wbc 3.8, rbc 4.26, hgb 12.2, hct 36.2  Sodium 133, potassium 4.7, glucose 80, BUN 38, Cr 1.15 06/21/12 sodium 132, potassium 4.5, glucose 67, BUN 8 Cr 0.78  Assessment/Plan      Dementia, without behavioral disturbance 294.20     Dementia is stable; will change namenda to XR dosing    2. Osteoarthritis   stable at this time   3.   GERD 530.81     Patient is stable; continue current regimen. Will monitor and make changes as necessary.   4.   HYPERTENSION 401.9     Patient is stable; continue current regimen. Will monitor and make changes as necessary.   5.   Hyponatremia   Will follow up BMP

## 2012-08-20 ENCOUNTER — Non-Acute Institutional Stay (SKILLED_NURSING_FACILITY): Payer: Medicare Other | Admitting: Adult Health

## 2012-08-20 DIAGNOSIS — I10 Essential (primary) hypertension: Secondary | ICD-10-CM

## 2012-08-20 DIAGNOSIS — M1990 Unspecified osteoarthritis, unspecified site: Secondary | ICD-10-CM

## 2012-08-20 DIAGNOSIS — F0391 Unspecified dementia with behavioral disturbance: Secondary | ICD-10-CM

## 2012-08-20 DIAGNOSIS — F03918 Unspecified dementia, unspecified severity, with other behavioral disturbance: Secondary | ICD-10-CM

## 2012-08-20 DIAGNOSIS — F29 Unspecified psychosis not due to a substance or known physiological condition: Secondary | ICD-10-CM

## 2012-08-20 DIAGNOSIS — K5900 Constipation, unspecified: Secondary | ICD-10-CM

## 2012-10-08 ENCOUNTER — Non-Acute Institutional Stay (SKILLED_NURSING_FACILITY): Payer: Medicare Other | Admitting: Nurse Practitioner

## 2012-10-08 DIAGNOSIS — K5900 Constipation, unspecified: Secondary | ICD-10-CM

## 2012-10-08 DIAGNOSIS — F0391 Unspecified dementia with behavioral disturbance: Secondary | ICD-10-CM

## 2012-10-08 DIAGNOSIS — I10 Essential (primary) hypertension: Secondary | ICD-10-CM

## 2012-10-08 DIAGNOSIS — F03918 Unspecified dementia, unspecified severity, with other behavioral disturbance: Secondary | ICD-10-CM

## 2012-10-08 DIAGNOSIS — M1990 Unspecified osteoarthritis, unspecified site: Secondary | ICD-10-CM

## 2012-10-08 NOTE — Progress Notes (Signed)
Patient ID: [name], female   DOB: 01-02-1923, 79 y.o.   MRN: [id number]   PCP: Bufford Spikes, DO   Allergies  Allergen Reactions  . Amlodipine Other (See Comments)    Dizziness & elevated BP  . Hydrocodone Itching  . Penicillins Other (See Comments)    Per [facility]    Chief Complaint  Patient presents with  . Medical Managment of Chronic Issues    HPI:  79 year old female who is a long term resident at [facility] is being seen for routine visit and is currently without any complaints. Nursing is without any concerns at this time.   Reassessment of ongoing issues DEMENTIA, VASCULAR, UNCOMPLICATED The Alzheimer's disease symptoms have not changed.The patient has agitation.No complications noted from the medication presently being used.followed by psych services.   HTN UNSPECIFIED The blood pressure readings taken outside the office since the last visit have been in the target range. No complications noted from the medication presently being used CONSTIPATION The symptoms are stable. Currently the patient is not taking medication for this problem. No other therapies have been tried.takes MiraLax qod.   -OSTEOARTHRITIS The arthritis is stable.No complications noted from the medication presently being used.no complaints of joint pain while taking aleve twice daily and uses biofreeze four times as a day as needed for joint pain.    Review of Systems:  Review of Systems  Constitutional: Negative for malaise/fatigue.  HENT: Negative.   Respiratory: Negative for cough and shortness of breath.   Cardiovascular: Negative for chest pain.  Gastrointestinal: Negative for abdominal pain, diarrhea and constipation.  Genitourinary: Negative for dysuria, urgency and frequency.  Musculoskeletal: Positive for myalgias and joint pain.       Relieved by medication   Skin: Negative.   Neurological: Negative for dizziness, weakness and headaches.  Psychiatric/Behavioral: Negative for depression. The  patient does not have insomnia.      Past Medical History  Diagnosis Date  . Hypertension   . Hyperlipidemia   . Cardiomyopathy   . Pacemaker     [employer] [id number]  . PVD (peripheral vascular disease) 2007    stenting b legs 2007,   . Allergic rhinitis   . GERD (gastroesophageal reflux disease)   . Osteoporosis   . Osteoarthritis   . Gallstones     per [place] 11-2008, also no AAA  . CHF (congestive heart failure)   . Pacemaker    Past Surgical History  Procedure Laterality Date  . Pacemaker insertion      permanent   . Breast biopsy benign     Social History:   reports that she has quit smoking. She does not have any smokeless tobacco history on file. She reports that she does not drink alcohol. Her drug history is not on file.  Family History  Problem Relation Age of Onset  . Hypertension    . Breast cancer Neg Hx   . Colon cancer Neg Hx     Medications: Patient's Medications  New Prescriptions   No medications on file  Previous Medications   ACETAMINOPHEN (TYLENOL) 325 MG TABLET    Take 2 tablets (650 mg total) by mouth every 6 (six) hours as needed.   ASPIRIN EC 81 MG TABLET    Take 81 mg by mouth daily.     CALCIUM-VITAMIN D (OSCAL WITH D) 500-200 MG-UNIT PER TABLET    Take 1 tablet by mouth daily.   CYANOCOBALAMIN 500 MCG TABLET    Take 1,000  mcg by mouth daily.   FEEDING SUPPLEMENT (ENSURE COMPLETE) LIQD    Take 237 mLs by mouth 2 (two) times daily between meals.   FEXOFENADINE (ALLEGRA) 180 MG TABLET    Take 180 mg by mouth daily.   MEMANTINE (NAMENDA) 10 MG TABLET    Take 10 mg by mouth 2 (two) times daily.    MENTHOL, TOPICAL ANALGESIC, (BIOFREEZE) 4 % GEL    Apply 1 application topically 4 (four) times daily as needed. For pain.   OLOPATADINE HCL (PATADAY) 0.2 % SOLN    Place 1 drop into both eyes daily.   POLYETHYLENE GLYCOL (MIRALAX / GLYCOLAX) PACKET    Take 17 g by mouth every other day.    PROPYLENE GLYCOL 0.6 % SOLN    Place 1 drop into  both eyes 3 (three) times daily.  Modified Medications   No medications on file  Discontinued Medications   LORAZEPAM (ATIVAN) 2 MG/ML INJECTION    Inject 0.5 mg into the vein every 12 (twelve) hours as needed for anxiety.   RISPERIDONE (RISPERDAL) 1 MG/ML ORAL SOLUTION    Take 0.5 mg by mouth 2 (two) times daily.     Physical Exam: Constitutional: She is well-developed, well-nourished, and in no distress. No distress.  HENT:   Head: Normocephalic and atraumatic.   Mouth/Throat: Oropharynx is clear and moist. No oropharyngeal exudate.  Eyes: Conjunctivae and EOM are normal. Pupils are equal, round, and reactive to light.  Neck: Normal range of motion. Neck supple.  Cardiovascular: Normal rate, regular rhythm and normal heart sounds.   Pulmonary/Chest: Effort normal and breath sounds normal. No respiratory distress.  Abdominal: Soft. Bowel sounds are normal. She exhibits no distension. There is no tenderness.  Musculoskeletal: She exhibits no edema and no tenderness.  Neurological: She is alert.  Skin: Skin is warm and dry. She is not diaphoretic.   Filed Vitals:   10/08/12 1121  BP: 145/54  Pulse: 80  Temp: 98.7 F (37.1 C)  Resp: 20      Labs reviewed: Basic Metabolic Panel:  Recent Labs  96/04/54 2218 06/14/12 0510 06/15/12 0330  NA 129* 130* 135  K 5.4* 5.4* 4.1  CL 96 101 105  CO2 20 20 22   GLUCOSE 148* 106* 73  BUN 40* 38* 25*  CREATININE 1.29* 0.99 0.84  CALCIUM 9.3 8.6 7.8*   Liver Function Tests: No results found for this basename: AST, ALT, ALKPHOS, BILITOT, PROT, ALBUMIN,  in the last 8760 hours No results found for this basename: LIPASE, AMYLASE,  in the last 8760 hours No results found for this basename: AMMONIA,  in the last 8760 hours CBC:  Recent Labs  06/09/12 2135 06/10/12 0520 06/13/12 1750 06/14/12 0510  WBC 7.8 8.4 7.6 8.0  NEUTROABS 5.1  --  5.8  --   HGB 13.0 11.9* 12.1 11.0*  HCT 38.0 36.0 34.8* 31.8*  MCV 85.4 85.9 86.1 84.8   PLT 210 210 234 226   Cardiac Enzymes:  Recent Labs  06/13/12 1750 06/13/12 2056 06/14/12 0142  TROPONINI <0.30 <0.30 <0.30   BNP: No components found with this basename: POCBNP,  CBG:  Recent Labs  06/09/12 1938  GLUCAP 97    06/07/12: wbc 3.8, rbc 4.26, hgb 12.2, hct 36.2             Sodium 133, potassium 4.7, glucose 80, BUN 38, Cr 1.15 06/21/12 sodium 132, potassium 4.5, glucose 67, BUN 8 Cr 0.78   Assessment/Plan 1. HYPERTENSION Remains stable;  off all medications  2. Constipation Patient is stable; continue current regimen. Will monitor and make changes as necessary.  3. Dementia Functions well in curent setting will cont namenda  4. OSTEOARTHRITIS Pain is stable on current medications    Labs/tests ordered Will follow up cbc and bmp at this time

## 2012-10-29 ENCOUNTER — Ambulatory Visit (INDEPENDENT_AMBULATORY_CARE_PROVIDER_SITE_OTHER): Payer: Medicare Other | Admitting: Internal Medicine

## 2012-10-29 ENCOUNTER — Encounter: Payer: Self-pay | Admitting: Internal Medicine

## 2012-10-29 VITALS — BP 153/71 | HR 69 | Ht 67.0 in | Wt 180.0 lb

## 2012-10-29 DIAGNOSIS — I442 Atrioventricular block, complete: Secondary | ICD-10-CM

## 2012-10-29 DIAGNOSIS — I10 Essential (primary) hypertension: Secondary | ICD-10-CM

## 2012-10-29 DIAGNOSIS — Z950 Presence of cardiac pacemaker: Secondary | ICD-10-CM

## 2012-10-29 LAB — PACEMAKER DEVICE OBSERVATION
AL IMPEDENCE PM: 300 Ohm
ATRIAL PACING PM: 2
BAMS-0002: 8 ms
BAMS-0003: 70 {beats}/min
RV LEAD IMPEDENCE PM: 500 Ohm
RV LEAD THRESHOLD: 1.08 V

## 2012-10-29 NOTE — Patient Instructions (Signed)
Your physician recommends that you schedule a follow-up appointment in: 3 months with device clinic  

## 2012-10-31 ENCOUNTER — Encounter: Payer: Self-pay | Admitting: Internal Medicine

## 2012-10-31 NOTE — Assessment & Plan Note (Signed)
Her systolic blood pressure is elevated today. She states that normally it is under better control. I've asked the patient to reduce her salt intake. She is on no blood pressure lowering medications at the present time. If her blood pressure remains elevated, she will need medical therapy.

## 2012-10-31 NOTE — Progress Notes (Signed)
HPI [name] returns today for followup. She is a very pleasant elderly woman with syncope and documented bradycardia, status post pacemaker insertion. In the interim, she has done well. She has had no recurrent syncope. She is in assisted living. She denies chest pain, shortness of breath, or syncope. Minimal peripheral edema. She notes chronic arthritic pain. Allergies  Allergen Reactions  . Amlodipine Other (See Comments)    Dizziness & elevated BP  . Hydrocodone Itching  . Penicillins Other (See Comments)    Per [facility]     Current Outpatient Prescriptions  Medication Sig Dispense Refill  . acetaminophen (TYLENOL) 325 MG tablet Take 2 tablets (650 mg total) by mouth every 6 (six) hours as needed.      Marland Kitchen aspirin EC 81 MG tablet Take 81 mg by mouth daily.        . calcium-vitamin D (OSCAL WITH D) 500-200 MG-UNIT per tablet Take 1 tablet by mouth daily.      . clotrimazole (LOTRIMIN) 1 % cream Apply 1 application topically 2 (two) times daily.      . cyanocobalamin 500 MCG tablet Take 1,000 mcg by mouth daily.      . fexofenadine (ALLEGRA) 180 MG tablet Take 180 mg by mouth daily.      . memantine (NAMENDA) 10 MG tablet Take 10 mg by mouth 2 (two) times daily.       . Menthol, Topical Analgesic, (BIOFREEZE) 4 % GEL Apply 1 application topically 4 (four) times daily as needed. For pain.      . polyethylene glycol (MIRALAX / GLYCOLAX) packet Take 17 g by mouth every other day.        No current facility-administered medications for this visit.     Past Medical History  Diagnosis Date  . Hypertension   . Hyperlipidemia   . Cardiomyopathy   . Pacemaker     [employer] [id number]  . PVD (peripheral vascular disease) 2007    stenting b legs 2007,   . Allergic rhinitis   . GERD (gastroesophageal reflux disease)   . Osteoporosis   . Osteoarthritis   . Gallstones     per [place] 11-2008, also no AAA  . CHF (congestive heart failure)   . Pacemaker     ROS:   All systems  reviewed and negative except as noted in the HPI.   Past Surgical History  Procedure Laterality Date  . Pacemaker insertion      permanent   . Breast biopsy benign       Family History  Problem Relation Age of Onset  . Hypertension    . Breast cancer Neg Hx   . Colon cancer Neg Hx      History   Social History  . Marital Status: Widowed    Spouse Name: N/A    Number of Children: N/A  . Years of Education: N/A   Occupational History  . retired    Social History Main Topics  . Smoking status: Former Games developer  . Smokeless tobacco: Not on file  . Alcohol Use: No  . Drug Use: Not on file  . Sexual Activity: Not on file   Other Topics Concern  . Not on file   Social History Narrative   Comes to the office frequently with her nephew [name]...   Lives by her self...   ADL independent...   Still drives...   No children but has family around...Marland KitchenMarland KitchenMarland Kitchen   End of life issues discussed  Today 05-16-10,  again she does not like heroic measures, intubation or CPR.     BP 153/71  Pulse 69  Ht 5\' 7"  (1.702 m)  Wt 180 lb (81.647 kg)  BMI 28.19 kg/m2  Physical Exam:  Well appearing elderly woman, NAD HEENT: Unremarkable Neck:  No JVD, no thyromegally Lungs:  Clear with no wheezes, rales, or rhonchi.  HEART:  Regular rate rhythm, no murmurs, no rubs, no clicks Abd:  soft, positive bowel sounds, no organomegally, no rebound, no guarding Ext:  2 plus pulses, no edema, no cyanosis, no clubbing Skin:  No rashes no nodules Neuro:  CN II through XII intact, motor grossly intact  DEVICE  Normal device function.  See PaceArt for details.   Assess/Plan:

## 2012-10-31 NOTE — Assessment & Plan Note (Signed)
Her [employer] dual-chamber pacemaker is working normally. We'll plan to recheck in several months. 

## 2012-11-12 ENCOUNTER — Non-Acute Institutional Stay (SKILLED_NURSING_FACILITY): Payer: Medicare Other | Admitting: Nurse Practitioner

## 2012-11-12 ENCOUNTER — Encounter: Payer: Self-pay | Admitting: Nurse Practitioner

## 2012-11-12 DIAGNOSIS — M1990 Unspecified osteoarthritis, unspecified site: Secondary | ICD-10-CM

## 2012-11-12 DIAGNOSIS — F03918 Unspecified dementia, unspecified severity, with other behavioral disturbance: Secondary | ICD-10-CM

## 2012-11-12 DIAGNOSIS — B353 Tinea pedis: Secondary | ICD-10-CM

## 2012-11-12 DIAGNOSIS — I10 Essential (primary) hypertension: Secondary | ICD-10-CM

## 2012-11-12 DIAGNOSIS — K5900 Constipation, unspecified: Secondary | ICD-10-CM

## 2012-11-12 DIAGNOSIS — F0391 Unspecified dementia with behavioral disturbance: Secondary | ICD-10-CM

## 2012-11-12 NOTE — Progress Notes (Signed)
Patient ID: [name], female   DOB: 17-Jul-1922, 79 y.o.   MRN: [id number] Nursing Home Location:  [facility] [place]   Place of Service: SNF (31)  PCP: REED, TIFFANY, DO   Allergies  Allergen Reactions  . Amlodipine Other (See Comments)    Dizziness & elevated BP  . Hydrocodone Itching  . Penicillins Other (See Comments)    Per [facility]    Chief Complaint  Patient presents with  . Medical Managment of Chronic Issues    HPI:  79 year old female who is a long term resident at [facility] is being seen for routine visit and is currently without any complaints. Nursing is without any concerns at this time.  Reassessment of ongoing issues  DEMENTIA, VASCULAR, UNCOMPLICATED The Alzheimer's disease symptoms have not changed.The patient has agitation.No complications noted from the medication presently being used.followed by psych services.  HTN UNSPECIFIED The blood pressure readings have been in the target range. No complications noted from the medication presently being used  CONSTIPATION The symptoms are stable. Currently the patient is not taking medication for this problem. No other therapies have been tried.takes MiraLax qod.  -OSTEOARTHRITIS The arthritis is stable.No complications noted from the medication presently being used.no complaints of joint pain while taking aleve twice daily and uses biofreeze four times as a day as needed for joint pain.   Review of Systems:  Review of Systems  Constitutional: Negative for malaise/fatigue.  HENT: Negative.   Respiratory: Negative for cough and shortness of breath.   Cardiovascular: Negative for chest pain.  Gastrointestinal: Negative for abdominal pain, diarrhea and constipation.  Genitourinary: Negative for dysuria, urgency and frequency.  Musculoskeletal: Positive for joint pain (knees) and myalgias.       Relieved by medication   Skin: Negative.   Neurological: Negative for dizziness, weakness and headaches.   Psychiatric/Behavioral: Negative for depression. The patient does not have insomnia.      Past Medical History  Diagnosis Date  . Hypertension   . Hyperlipidemia   . Cardiomyopathy   . Pacemaker     [employer] [id number]  . PVD (peripheral vascular disease) 2007    stenting b legs 2007,   . Allergic rhinitis   . GERD (gastroesophageal reflux disease)   . Osteoporosis   . Osteoarthritis   . Gallstones     per [place] 11-2008, also no AAA  . CHF (congestive heart failure)   . Pacemaker    Past Surgical History  Procedure Laterality Date  . Pacemaker insertion      permanent   . Breast biopsy benign     Social History:   reports that she has quit smoking. She does not have any smokeless tobacco history on file. She reports that she does not drink alcohol. Her drug history is not on file.  Family History  Problem Relation Age of Onset  . Hypertension    . Breast cancer Neg Hx   . Colon cancer Neg Hx     Medications: Patient's Medications  New Prescriptions   No medications on file  Previous Medications   ACETAMINOPHEN (TYLENOL) 325 MG TABLET    Take 2 tablets (650 mg total) by mouth every 6 (six) hours as needed.   ASPIRIN EC 81 MG TABLET    Take 81 mg by mouth daily.     CALCIUM-VITAMIN D (OSCAL WITH D) 500-200 MG-UNIT PER TABLET    Take 1 tablet by mouth daily.   CLOTRIMAZOLE (LOTRIMIN) 1 %  CREAM    Apply 1 application topically 2 (two) times daily.   CYANOCOBALAMIN 500 MCG TABLET    Take 1,000 mcg by mouth daily.   FEXOFENADINE (ALLEGRA) 180 MG TABLET    Take 180 mg by mouth daily.   MEMANTINE (NAMENDA) 10 MG TABLET    Take 10 mg by mouth 2 (two) times daily.    MENTHOL, TOPICAL ANALGESIC, (BIOFREEZE) 4 % GEL    Apply 1 application topically 4 (four) times daily as needed. For pain.   POLYETHYLENE GLYCOL (MIRALAX / GLYCOLAX) PACKET    Take 17 g by mouth every other day.   Modified Medications   No medications on file  Discontinued Medications   No  medications on file     Physical Exam:  Filed Vitals:   11/12/12 1311  BP: 128/78  Pulse: 74  Temp: 98.2 F (36.8 C)  Resp: 20    Constitutional: She is well-developed, well-nourished, and in no distress.   HENT: unremarkable  Neck: Normal range of motion. Neck supple.  Cardiovascular: Normal rate, regular rhythm and normal heart sounds.  Pulmonary/Chest: Effort normal and breath sounds normal. No respiratory distress.  Abdominal: Soft. Bowel sounds are normal. She exhibits no distension. There is no tenderness.  Musculoskeletal: She exhibits no edema and no tenderness.  Neurological: She is alert. Oriented to self only.  Skin: Skin is warm and dry. She is not diaphoretic.    Labs reviewed: Basic Metabolic Panel:  Recent Labs  65/78/46 2218 06/14/12 0510 06/15/12 0330  NA 129* 130* 135  K 5.4* 5.4* 4.1  CL 96 101 105  CO2 20 20 22   GLUCOSE 148* 106* 73  BUN 40* 38* 25*  CREATININE 1.29* 0.99 0.84  CALCIUM 9.3 8.6 7.8*   Liver Function Tests: No results found for this basename: AST, ALT, ALKPHOS, BILITOT, PROT, ALBUMIN,  in the last 8760 hours No results found for this basename: LIPASE, AMYLASE,  in the last 8760 hours No results found for this basename: AMMONIA,  in the last 8760 hours CBC:  Recent Labs  06/09/12 2135 06/10/12 0520 06/13/12 1750 06/14/12 0510  WBC 7.8 8.4 7.6 8.0  NEUTROABS 5.1  --  5.8  --   HGB 13.0 11.9* 12.1 11.0*  HCT 38.0 36.0 34.8* 31.8*  MCV 85.4 85.9 86.1 84.8  PLT 210 210 234 226   Cardiac Enzymes:  Recent Labs  06/13/12 1750 06/13/12 2056 06/14/12 0142  TROPONINI <0.30 <0.30 <0.30   BNP: No components found with this basename: POCBNP,  CBG:  Recent Labs  06/09/12 1938  GLUCAP 97   TSH:  Recent Labs  06/09/12 2325  TSH 0.949   A1C: Lab Results  Component Value Date   HGBA1C 5.3 06/09/2012  06/07/12: wbc 3.8, rbc 4.26, hgb 12.2, hct 36.2  Sodium 133, potassium 4.7, glucose 80, BUN 38, Cr 1.15  06/21/12  sodium 132, potassium 4.5, glucose 67, BUN 8 Cr 0.78     CBC NO Diff (Complete Blood Count)    Result: 10/11/2012 12:27 PM   ( Status: F )     C WBC 5.9     4.0-10.5 K/uL SLN   RBC 3.57   L 3.87-5.11 MIL/uL SLN   Hemoglobin 10.3   L 12.0-15.0 g/dL SLN   Hematocrit 96.2   L 36.0-46.0 % SLN   MCV 87.4     78.0-100.0 fL SLN   MCH 28.9     26.0-34.0 pg SLN   MCHC 33.0  30.0-36.0 g/dL SLN   RDW 16.1     09.6-04.5 % SLN   Platelet Count 223     150-400 K/uL SLN   Basic Metabolic Panel    Result: 10/11/2012 11:58 AM   ( Status: F )       Sodium 132   L 135-145 mEq/L SLN   Potassium 4.2     3.5-5.3 mEq/L SLN   Chloride 100     96-112 mEq/L SLN   CO2 26     19-32 mEq/L SLN   Glucose 71     70-99 mg/dL SLN   BUN 19     4-09 mg/dL SLN   Creatinine 8.11     0.50-1.10 mg/dL SLN   Calcium 8.7     9.1-47.8 mg/dL SLN      Assessment/Plan 1. OSTEOARTHRITIS With pain in knees and shoulders; reports pain medication helps  2. Constipation -stable at this time  3. Fungal infection of right foot -heel; currently being treated with diflucan  4. Essential hypertension, benign -elevated at cardiologist office; improved currently  5. Dementia with behavioral disturbance -does well in current environment; cont namenda   6. Anemia Worse; 12. 2 in may; no signs of blood loss; will cont to monitor

## 2012-12-07 NOTE — Progress Notes (Signed)
Patient ID: [name], female   DOB: [birth year], 79 y.o.   MRN: [id number]     [facility]  Allergies  Allergen Reactions  . Amlodipine Other (See Comments)    Dizziness & elevated BP  . Hydrocodone Itching  . Penicillins Other (See Comments)    Per [facility]    Chief Complaint  Patient presents with  . Medical Managment of Chronic Issues    HPI  She is being seen for the management of her chronic illnesses. Overall her status remains unchanged in the recent past. The nursing staff is not voicing any concerns at this time. She is unable to fully participate in the hpi or ros.   Past Medical History  Diagnosis Date  . Hypertension   . Hyperlipidemia   . Cardiomyopathy   . Pacemaker     [employer] [id number]  . PVD (peripheral vascular disease) 2007    stenting b legs 2007,   . Allergic rhinitis   . GERD (gastroesophageal reflux disease)   . Osteoporosis   . Osteoarthritis   . Gallstones     per [place] 11-2008, also no AAA  . CHF (congestive heart failure)   . Pacemaker     Past Surgical History  Procedure Laterality Date  . Pacemaker insertion      permanent   . Breast biopsy benign      Filed Vitals:   08/20/12 1431  BP: 139/88  Pulse: 80  Height: 5\' 4"  (1.626 m)  Weight: 165 lb (74.844 kg)    MEDICATIONS Asa 81 mg daily biofreeze four times daily as needed Ca++ 500/200 daily Vit b12 500 mcg daily namenda 10 mg twice daily miralax daily pataday to both eyes daily Liquid tear to both eyes twice daily risperdal 0.5 mg twice daily Lisinopril 5 mg daily   LABS REVIEWED;   04-19-12: wbc 6.2; hgb 10.0; hct 30.0; mcv 87.2; plt 246;glucose 62; bun 23; create 0.85; k+4.5; na++131; liver normal albumin 3.5 05-03-12: urine culture: c koeri/ staph 45,000 colonies 05-28-12: urine culture: mixed   Review of Systems  Unable to perform ROS   Physical Exam  Constitutional: No distress.  thin  Neck: Neck supple. No JVD present.  Cardiovascular: Normal  rate, regular rhythm and intact distal pulses.   Respiratory: Effort normal and breath sounds normal. No respiratory distress. She has no wheezes.  GI: Soft. Bowel sounds are normal. She exhibits no distension. There is no tenderness.  Musculoskeletal: Normal range of motion. She exhibits no edema.  Neurological: She is alert.  Skin: Skin is warm and dry. She is not diaphoretic.      ASSESSMENT/PLAN  1. Hypertension: is stable will continue lisinopril 5 mg daily  2 dementia; is without change in status; will continue namenda 10 mg twice daily and will monitor  3. Psychosis: she is presently stable will continue her risperdal 0.5 mg twice daily and will monitor  4. Constipation: will continue miralax daily  5. Osteoarthritis: is without change is not taking medication on a routine basis; will not make changes and will monitor

## 2012-12-20 ENCOUNTER — Non-Acute Institutional Stay (SKILLED_NURSING_FACILITY): Payer: Medicare Other | Admitting: Nurse Practitioner

## 2012-12-20 DIAGNOSIS — M1990 Unspecified osteoarthritis, unspecified site: Secondary | ICD-10-CM

## 2012-12-20 DIAGNOSIS — F03918 Unspecified dementia, unspecified severity, with other behavioral disturbance: Secondary | ICD-10-CM

## 2012-12-20 DIAGNOSIS — B353 Tinea pedis: Secondary | ICD-10-CM

## 2012-12-20 DIAGNOSIS — K219 Gastro-esophageal reflux disease without esophagitis: Secondary | ICD-10-CM

## 2012-12-20 DIAGNOSIS — I10 Essential (primary) hypertension: Secondary | ICD-10-CM

## 2012-12-20 DIAGNOSIS — F0391 Unspecified dementia with behavioral disturbance: Secondary | ICD-10-CM

## 2012-12-20 NOTE — Progress Notes (Signed)
Patient ID: [name], female   DOB: 09-29-1922, 79 y.o.   MRN: [id number]    Nursing Home Location:  [facility] [place]   Place of Service: SNF (31)  PCP: REED, TIFFANY, DO  Allergies  Allergen Reactions  . Amlodipine Other (See Comments)    Dizziness & elevated BP  . Hydrocodone Itching  . Penicillins Other (See Comments)    Per [facility]    Chief Complaint  Patient presents with  . Medical Managment of Chronic Issues    HPI:  79 year old female who is a long term resident at [facility] is being seen for routine visit and is currently without any complaints. Nursing is without any concerns at this time.  Reassessment of ongoing issues  DEMENTIA, VASCULAR, UNCOMPLICATED The Alzheimer's disease symptoms have not changed.The patient has agitation.No complications noted from the medication presently being used.followed by psych services. Taking namenda HTN UNSPECIFIED The blood pressure readings have been in the target range. No complications noted from the medication presently being used  CONSTIPATION The symptoms are stable. Currently the patient is not taking medication for this problem. No other therapies have been tried.takes MiraLax qod.  -OSTEOARTHRITIS The arthritis is stable.No complications noted from the medication presently being used uses biofreeze four times as a day as needed and tylenol as needed for joint pain.    Review of Systems:  Review of Systems  Constitutional: Negative for weight loss and malaise/fatigue.  HENT: Negative.   Respiratory: Negative for cough and shortness of breath.   Cardiovascular: Negative for chest pain.  Gastrointestinal: Negative for abdominal pain, diarrhea and constipation.  Genitourinary: Negative for dysuria and frequency.  Musculoskeletal: Positive for joint pain (knees).  Skin: Negative.   Neurological: Negative for dizziness, weakness and headaches.  Psychiatric/Behavioral: Negative for depression. The patient does  not have insomnia.      Past Medical History  Diagnosis Date  . Hypertension   . Hyperlipidemia   . Cardiomyopathy   . Pacemaker     [employer] [id number]  . PVD (peripheral vascular disease) 2007    stenting b legs 2007,   . Allergic rhinitis   . GERD (gastroesophageal reflux disease)   . Osteoporosis   . Osteoarthritis   . Gallstones     per [place] 11-2008, also no AAA  . CHF (congestive heart failure)   . Pacemaker    Past Surgical History  Procedure Laterality Date  . Pacemaker insertion      permanent   . Breast biopsy benign     Social History:   reports that she has quit smoking. She does not have any smokeless tobacco history on file. She reports that she does not drink alcohol. Her drug history is not on file.  Family History  Problem Relation Age of Onset  . Hypertension    . Breast cancer Neg Hx   . Colon cancer Neg Hx     Medications: Patient's Medications  New Prescriptions   No medications on file  Previous Medications   ACETAMINOPHEN (TYLENOL) 325 MG TABLET    Take 2 tablets (650 mg total) by mouth every 6 (six) hours as needed.   ASPIRIN EC 81 MG TABLET    Take 81 mg by mouth daily.     CALCIUM-VITAMIN D (OSCAL WITH D) 500-200 MG-UNIT PER TABLET    Take 1 tablet by mouth daily.   CLOTRIMAZOLE (LOTRIMIN) 1 % CREAM    Apply 1 application topically 2 (two) times daily.  CYANOCOBALAMIN 500 MCG TABLET    Take 1,000 mcg by mouth daily.   FEXOFENADINE (ALLEGRA) 180 MG TABLET    Take 180 mg by mouth daily.   MEMANTINE (NAMENDA) 10 MG TABLET    Take 10 mg by mouth 2 (two) times daily.    MENTHOL, TOPICAL ANALGESIC, (BIOFREEZE) 4 % GEL    Apply 1 application topically 4 (four) times daily as needed. For pain.   POLYETHYLENE GLYCOL (MIRALAX / GLYCOLAX) PACKET    Take 17 g by mouth every other day.   Modified Medications   No medications on file  Discontinued Medications   No medications on file     Physical Exam: Physical Exam  Constitutional:  She appears well-developed and well-nourished. No distress.  HENT:  Mouth/Throat: Oropharynx is clear and moist. No oropharyngeal exudate.  Neck: Normal range of motion. Neck supple. No thyromegaly present.  Cardiovascular: Normal rate, regular rhythm and normal heart sounds.   Pulmonary/Chest: Effort normal and breath sounds normal. No respiratory distress.  Abdominal: Soft. Bowel sounds are normal. She exhibits no distension. There is no tenderness.  Musculoskeletal: She exhibits no edema and no tenderness.  Lymphadenopathy:    She has no cervical adenopathy.  Neurological: She is alert.  Skin: Skin is warm and dry. She is not diaphoretic. No erythema.  Psychiatric: She has a normal mood and affect.    Filed Vitals:   12/20/12 1420  BP: 100/81  Pulse: 72  Temp: 98.6 F (37 C)  Resp: 20      Labs reviewed: Basic Metabolic Panel:  Recent Labs  16/10/96 2218 06/14/12 0510 06/15/12 0330  NA 129* 130* 135  K 5.4* 5.4* 4.1  CL 96 101 105  CO2 20 20 22   GLUCOSE 148* 106* 73  BUN 40* 38* 25*  CREATININE 1.29* 0.99 0.84  CALCIUM 9.3 8.6 7.8*   CBC:  Recent Labs  06/09/12 2135 06/10/12 0520 06/13/12 1750 06/14/12 0510  WBC 7.8 8.4 7.6 8.0  NEUTROABS 5.1  --  5.8  --   HGB 13.0 11.9* 12.1 11.0*  HCT 38.0 36.0 34.8* 31.8*  MCV 85.4 85.9 86.1 84.8  PLT 210 210 234 226   Cardiac Enzymes:  Recent Labs  06/13/12 1750 06/13/12 2056 06/14/12 0142  TROPONINI <0.30 <0.30 <0.30   BNP: No components found with this basename: POCBNP,  CBG:  Recent Labs  06/09/12 1938  GLUCAP 97   TSH:  Recent Labs  06/09/12 2325  TSH 0.949   A1C: Lab Results  Component Value Date   HGBA1C 5.3 06/09/2012   CBC NO Diff (Complete Blood Count)  Result: 10/11/2012 12:27 PM ( Status: F ) C  WBC 5.9 4.0-10.5 K/uL SLN  RBC 3.57 L 3.87-5.11 MIL/uL SLN  Hemoglobin 10.3 L 12.0-15.0 g/dL SLN  Hematocrit 04.5 L 36.0-46.0 % SLN  MCV 87.4 78.0-100.0 fL SLN  MCH 28.9 26.0-34.0  pg SLN  MCHC 33.0 30.0-36.0 g/dL SLN  RDW 40.9 81.1-91.4 % SLN  Platelet Count 223 150-400 K/uL SLN  Basic Metabolic Panel  Result: 10/11/2012 11:58 AM ( Status: F )  Sodium 132 L 135-145 mEq/L SLN  Potassium 4.2 3.5-5.3 mEq/L SLN  Chloride 100 96-112 mEq/L SLN  CO2 26 19-32 mEq/L SLN  Glucose 71 70-99 mg/dL SLN  BUN 19 7-82 mg/dL SLN  Creatinine 9.56 2.13-0.86 mg/dL SLN  Calcium 8.7 5.7-84.6 mg/dL SLN    Assessment/Plan 1. Dementia with behavioral disturbance -conts with namenda; no recent behaviors pt is pleasantly confused  2.  OSTEOARTHRITIS -remains with shoulder pain, and bilateral knee pain; conts on tylenol and biofreeze  3. HYPERTENSION -stable; not on medications at this time  4. GERD -with occasional GERD however is short lived; will cont to monitor  5. Fungal infection of foot -resolved  6. Anemia Will follow up CBC  7. Allergies  -stable; conts on allegra   Labs/tests ordered CBC and BMP

## 2013-02-22 ENCOUNTER — Encounter: Payer: Self-pay | Admitting: Internal Medicine

## 2013-02-22 ENCOUNTER — Non-Acute Institutional Stay (SKILLED_NURSING_FACILITY): Payer: Medicare Other | Admitting: Internal Medicine

## 2013-02-22 DIAGNOSIS — M1990 Unspecified osteoarthritis, unspecified site: Secondary | ICD-10-CM

## 2013-02-22 DIAGNOSIS — E871 Hypo-osmolality and hyponatremia: Secondary | ICD-10-CM

## 2013-02-22 DIAGNOSIS — F0391 Unspecified dementia with behavioral disturbance: Secondary | ICD-10-CM

## 2013-02-22 DIAGNOSIS — I10 Essential (primary) hypertension: Secondary | ICD-10-CM

## 2013-02-22 DIAGNOSIS — M810 Age-related osteoporosis without current pathological fracture: Secondary | ICD-10-CM

## 2013-02-22 DIAGNOSIS — F03918 Unspecified dementia, unspecified severity, with other behavioral disturbance: Secondary | ICD-10-CM

## 2013-02-22 DIAGNOSIS — D638 Anemia in other chronic diseases classified elsewhere: Secondary | ICD-10-CM

## 2013-02-22 DIAGNOSIS — K219 Gastro-esophageal reflux disease without esophagitis: Secondary | ICD-10-CM

## 2013-02-22 NOTE — Assessment & Plan Note (Addendum)
On no  meds but needs to be;start Lisinopril 5 mg daily

## 2013-02-22 NOTE — Assessment & Plan Note (Signed)
Pt c/o R shoulder pain when it rains;she has prn biofreeze but will order it scheduled BID

## 2013-02-22 NOTE — Assessment & Plan Note (Signed)
Controlled at this time on no meds

## 2013-02-22 NOTE — Assessment & Plan Note (Signed)
Early January 2015 pt'd Na was 125; aftersodium tablets started BId it came up to 133; will continue

## 2013-02-22 NOTE — Assessment & Plan Note (Signed)
Pt on daily Ca+ and vit D;to continue

## 2013-02-22 NOTE — Assessment & Plan Note (Signed)
H/H 01/14/2013 was 11.3;prior in 09/2012 was 10.3; pt on daily Vit B12

## 2013-02-22 NOTE — Assessment & Plan Note (Signed)
Very sweet lady; continue namenda

## 2013-02-22 NOTE — Progress Notes (Signed)
MRN: [id number] Name: Ashley LouisGertrude K Pintor  Sex: female Age: 80 y.o. DOB: 11/29/1922  [facility] Facility[facility]Room:222B Level Of Care: SNF Provider: Merrilee SeashoreALEXANDER, ANNE D Emergency Contacts: Extended Emergency Contact Information Primary Emergency Contact: Hardin NegusJackson,Flossie Address: [address]          [place], [place] Home Phone: [phone] Mobile Phone: [phone] Relation: Niece Secondary Emergency Contact: Gwynn,John&Karen  [place] Home Phone: [phone] Relation: Nephew  Code Status: DNR  Allergies: Amlodipine; Hydrocodone; and Penicillins  Chief Complaint  Patient presents with  . Medical Managment of Chronic Issues    HPI: Patient is 80 y.o. female who is being seen for routine problems.  Past Medical History  Diagnosis Date  . Hypertension   . Hyperlipidemia   . Cardiomyopathy   . Pacemaker     [employer] [id number]  . PVD (peripheral vascular disease) 2007    stenting b legs 2007,   . Allergic rhinitis   . GERD (gastroesophageal reflux disease)   . Osteoporosis   . Osteoarthritis   . Gallstones     per US 11-2008, also no AAA  . CHF (congestive heart failure)   . Pacemaker     Past Surgical History  Procedure Laterality Date  . Pacemaker insertion      permanent   . Breast biopsy benign        Medication List       This list is accurate as of: 02/22/13  4:14 PM.  Always use your most recent med list.               acetaminophen 325 MG tablet  Commonly known as:  TYLENOL  Take 2 tablets (650 mg total) by mouth every 6 (six) hours as needed.     aspirin EC 81 MG tablet  Take 81 mg by mouth daily.     BIOFREEZE 4 % Gel  Generic drug:  Menthol (Topical Analgesic)  Apply 1 application topically 4 (four) times daily as needed. For pain.     calcium-vitamin D 500-200 MG-UNIT per tablet  Commonly known as:  OSCAL WITH D  Take 1 tablet by mouth daily.     clotrimazole 1 % cream   Commonly known as:  LOTRIMIN  Apply 1 application topically 2 (two) times daily.     cyanocobalamin 500 MCG tablet  Take 1,000 mcg by mouth daily.     fexofenadine 180 MG tablet  Commonly known as:  ALLEGRA  Take 180 mg by mouth daily.     memantine 10 MG tablet  Commonly known as:  NAMENDA  Take 10 mg by mouth 2 (two) times daily.     polyethylene glycol packet  Commonly known as:  MIRALAX / GLYCOLAX  Take 17 g by mouth every other day.        No orders of the defined types were placed in this encounter.    Immunization History  Administered Date(s) Administered  . Influenza Whole 11/05/2009, 10/25/2012  . Pneumococcal Polysaccharide-23 08/24/2000, 03/03/2008  . Pneumococcal-Unspecified 06/21/2012  . Td 06/17/2001    History  Substance Use Topics  . Smoking status: Former Games developermoker  . Smokeless tobacco: Not on file  . Alcohol Use: No    Review of Systems  DATA OBTAINED: from patient; C/O ONLY ARTHRITIS ESPECIALLY R SHOULDER-would like something she can rub on it GENERAL: Feels well no fevers, fatigue, appetite changes SKIN: No itching, rash HEENT: No complaint RESPIRATORY: No cough, wheezing, SOB  CARDIAC: No chest pain, palpitations, lower extremity edema  GI: No abdominal pain, No N/V/D or constipation, No heartburn or reflux  GU: No dysuria, frequency or urgency, or incontinence  MUSCULOSKELETAL: No unrelieved bone/joint pain NEUROLOGIC: No headache, dizziness or focal weakness PSYCHIATRIC: No overt anxiety or sadness. Sleeps well.   Filed Vitals:   02/22/13 1555  BP: 161/90  Pulse: 81  Temp: 97.9 F (36.6 C)  Resp: 17    Physical Exam  GENERAL APPEARANCE: Alert, conversant. Appropriately groomed. No acute distress  SKIN: No diaphoresis rash HEENT: Unremarkable RESPIRATORY: Breathing is even, unlabored. Lung sounds are clear   CARDIOVASCULAR: Heart RRR no murmurs, rubs or gallops. No peripheral edema  GASTROINTESTINAL: Abdomen is soft,  non-tender, not distended w/ normal bowel sounds.  GENITOURINARY: Bladder non tender, not distended  MUSCULOSKELETAL: No abnormal joints or musculature NEUROLOGIC: Cranial nerves 2-12 grossly intact. Moves all extremities no tremor. PSYCHIATRIC: Mood and affect appropriate to situation, no behavioral issues  Patient Active Problem List   Diagnosis Date Noted  . Anemia of chronic disease 02/22/2013  . Psychosis 08/20/2012  . Hyponatremia 07/15/2012  . Mouth ulcers 06/15/2012  . Dehydration 06/14/2012  . FTT (failure to thrive) in adult 06/14/2012  . Abnormal urinalysis 06/14/2012  . Agitated 06/14/2012  . Palliative care encounter 06/14/2012  . Hypotension 06/13/2012  . Acute respiratory failure with hypoxia 06/13/2012  . Altered mental state 06/09/2012  . Dementia with behavioral disturbance 04/28/2012  . Constipation 08/26/2010  . Weight loss 08/26/2010  . General medical examination 05/16/2010  . PRURITUS 03/14/2010  . OTHER B-COMPLEX DEFICIENCIES 09/19/2009  . CHOLELITHIASIS 12/13/2008  . AV BLOCK, COMPLETE 05/03/2008  . BRADYCARDIA 05/03/2008  . OSTEOARTHRITIS 10/18/2007  . OSTEOPOROSIS 06/15/2007  . DIZZINESS 03/05/2007  . HYPERLIPIDEMIA 12/18/2006  . History of cardiomyopathy 06/17/2006  . PVD 06/17/2006  . HYPERTENSION 06/12/2006  . ALLERGIC RHINITIS 06/12/2006  . GERD 06/12/2006  . PACEMAKER-[employer] 06/12/2006    CBC    Component Value Date/Time   WBC 8.0 06/14/2012 0510   RBC 3.75* 06/14/2012 0510   HGB 11.0* 06/14/2012 0510   HCT 31.8* 06/14/2012 0510   PLT 226 06/14/2012 0510   MCV 84.8 06/14/2012 0510   LYMPHSABS 0.9 06/13/2012 1750   MONOABS 0.3 06/13/2012 1750   EOSABS 0.5 06/13/2012 1750   BASOSABS 0.0 06/13/2012 1750    CMP     Component Value Date/Time   NA 135 06/15/2012 0330   K 4.1 06/15/2012 0330   CL 105 06/15/2012 0330   CO2 22 06/15/2012 0330   GLUCOSE 73 06/15/2012 0330   GLUCOSE 83 08/31/2009 1451   BUN 25* 06/15/2012 0330   CREATININE 0.84  06/15/2012 0330   CALCIUM 7.8* 06/15/2012 0330   PROT 7.3 08/27/2011 1810   ALBUMIN 3.9 08/27/2011 1810   AST 28 08/27/2011 1810   ALT 10 08/27/2011 1810   ALKPHOS 77 08/27/2011 1810   BILITOT 0.5 08/27/2011 1810   GFRNONAA 60* 06/15/2012 0330   GFRAA 70* 06/15/2012 0330    Assessment and Plan  Hyponatremia Early January 2015 pt'd Na was 125; aftersodium tablets started BId it came up to 133; will continue  Anemia of chronic disease H/H 01/14/2013 was 11.3;prior in 09/2012 was 10.3; pt on daily Vit B12  HYPERTENSION On no  meds but needs to be;start Lisinopril 5 mg daily  Dementia with behavioral disturbance Very sweet lady; continue namenda  OSTEOARTHRITIS Pt c/o R shoulder pain when it rains;she has prn biofreeze but will order  it scheduled BID  OSTEOPOROSIS Pt on daily Ca+ and vit D;to continue  GERD Controlled at this time on no meds    Margit Hanks, MD

## 2013-03-01 ENCOUNTER — Encounter (HOSPITAL_COMMUNITY): Payer: Self-pay | Admitting: Emergency Medicine

## 2013-03-01 ENCOUNTER — Emergency Department (HOSPITAL_COMMUNITY): Payer: Medicare Other

## 2013-03-01 ENCOUNTER — Observation Stay (HOSPITAL_COMMUNITY)
Admission: EM | Admit: 2013-03-01 | Discharge: 2013-03-02 | Disposition: A | Discharge status: Home / Self Care | Payer: Medicare Other | Attending: Internal Medicine | Admitting: Internal Medicine

## 2013-03-01 DIAGNOSIS — E785 Hyperlipidemia, unspecified: Secondary | ICD-10-CM | POA: Diagnosis not present

## 2013-03-01 DIAGNOSIS — Z950 Presence of cardiac pacemaker: Secondary | ICD-10-CM | POA: Insufficient documentation

## 2013-03-01 DIAGNOSIS — Z885 Allergy status to narcotic agent status: Secondary | ICD-10-CM | POA: Insufficient documentation

## 2013-03-01 DIAGNOSIS — I10 Essential (primary) hypertension: Secondary | ICD-10-CM | POA: Insufficient documentation

## 2013-03-01 DIAGNOSIS — Z87891 Personal history of nicotine dependence: Secondary | ICD-10-CM | POA: Insufficient documentation

## 2013-03-01 DIAGNOSIS — Z880 Allergy status to penicillin: Secondary | ICD-10-CM | POA: Diagnosis not present

## 2013-03-01 DIAGNOSIS — I739 Peripheral vascular disease, unspecified: Secondary | ICD-10-CM | POA: Insufficient documentation

## 2013-03-01 DIAGNOSIS — R112 Nausea with vomiting, unspecified: Secondary | ICD-10-CM | POA: Insufficient documentation

## 2013-03-01 DIAGNOSIS — R1013 Epigastric pain: Secondary | ICD-10-CM | POA: Insufficient documentation

## 2013-03-01 DIAGNOSIS — I442 Atrioventricular block, complete: Secondary | ICD-10-CM | POA: Diagnosis not present

## 2013-03-01 DIAGNOSIS — Z8709 Personal history of other diseases of the respiratory system: Secondary | ICD-10-CM | POA: Diagnosis not present

## 2013-03-01 DIAGNOSIS — I509 Heart failure, unspecified: Secondary | ICD-10-CM | POA: Diagnosis not present

## 2013-03-01 DIAGNOSIS — F0281 Dementia in other diseases classified elsewhere with behavioral disturbance: Secondary | ICD-10-CM | POA: Insufficient documentation

## 2013-03-01 DIAGNOSIS — R0789 Other chest pain: Principal | ICD-10-CM | POA: Insufficient documentation

## 2013-03-01 DIAGNOSIS — Z8679 Personal history of other diseases of the circulatory system: Secondary | ICD-10-CM

## 2013-03-01 DIAGNOSIS — M810 Age-related osteoporosis without current pathological fracture: Secondary | ICD-10-CM | POA: Insufficient documentation

## 2013-03-01 DIAGNOSIS — M1990 Unspecified osteoarthritis, unspecified site: Secondary | ICD-10-CM | POA: Insufficient documentation

## 2013-03-01 DIAGNOSIS — F0391 Unspecified dementia with behavioral disturbance: Secondary | ICD-10-CM

## 2013-03-01 DIAGNOSIS — F03918 Unspecified dementia, unspecified severity, with other behavioral disturbance: Secondary | ICD-10-CM

## 2013-03-01 DIAGNOSIS — K219 Gastro-esophageal reflux disease without esophagitis: Secondary | ICD-10-CM | POA: Diagnosis not present

## 2013-03-01 DIAGNOSIS — Z79899 Other long term (current) drug therapy: Secondary | ICD-10-CM | POA: Insufficient documentation

## 2013-03-01 DIAGNOSIS — Z888 Allergy status to other drugs, medicaments and biological substances status: Secondary | ICD-10-CM | POA: Diagnosis not present

## 2013-03-01 DIAGNOSIS — Z7982 Long term (current) use of aspirin: Secondary | ICD-10-CM | POA: Insufficient documentation

## 2013-03-01 DIAGNOSIS — R079 Chest pain, unspecified: Secondary | ICD-10-CM | POA: Diagnosis present

## 2013-03-01 DIAGNOSIS — F02818 Dementia in other diseases classified elsewhere, unspecified severity, with other behavioral disturbance: Secondary | ICD-10-CM | POA: Insufficient documentation

## 2013-03-01 DIAGNOSIS — R451 Restlessness and agitation: Secondary | ICD-10-CM

## 2013-03-01 LAB — HEPATIC FUNCTION PANEL
ALK PHOS: 102 U/L (ref 39–117)
ALT: 14 U/L (ref 0–35)
AST: 28 U/L (ref 0–37)
Albumin: 3.3 g/dL — ABNORMAL LOW (ref 3.5–5.2)
BILIRUBIN TOTAL: 0.3 mg/dL (ref 0.3–1.2)
Bilirubin, Direct: <0.2 mg/dL (ref 0.0–0.3)
Total Protein: 7.5 g/dL (ref 6.0–8.3)

## 2013-03-01 LAB — POCT I-STAT, CHEM 8
BUN: 18 mg/dL (ref 6–23)
CREATININE: 0.9 mg/dL (ref 0.50–1.10)
Calcium, Ion: 1.23 mmol/L (ref 1.13–1.30)
Chloride: 101 mEq/L (ref 96–112)
Glucose, Bld: 93 mg/dL (ref 70–99)
HEMATOCRIT: 42 % (ref 36.0–46.0)
HEMOGLOBIN: 14.3 g/dL (ref 12.0–15.0)
POTASSIUM: 4.5 meq/L (ref 3.7–5.3)
Sodium: 138 mEq/L (ref 137–147)
TCO2: 26 mmol/L (ref 0–100)

## 2013-03-01 LAB — CBC
HEMATOCRIT: 36.8 % (ref 36.0–46.0)
Hemoglobin: 12.4 g/dL (ref 12.0–15.0)
MCH: 30.3 pg (ref 26.0–34.0)
MCHC: 33.7 g/dL (ref 30.0–36.0)
MCV: 90 fL (ref 78.0–100.0)
Platelets: 239 10*3/uL (ref 150–400)
RBC: 4.09 MIL/uL (ref 3.87–5.11)
RDW: 14.1 % (ref 11.5–15.5)
WBC: 7.3 10*3/uL (ref 4.0–10.5)

## 2013-03-01 LAB — CBC WITH DIFFERENTIAL/PLATELET
Basophils Absolute: 0 10*3/uL (ref 0.0–0.1)
Basophils Relative: 0 % (ref 0–1)
EOS ABS: 0.6 10*3/uL (ref 0.0–0.7)
EOS PCT: 9 % — AB (ref 0–5)
HCT: 37.6 % (ref 36.0–46.0)
HEMOGLOBIN: 12.6 g/dL (ref 12.0–15.0)
Lymphocytes Relative: 36 % (ref 12–46)
Lymphs Abs: 2.7 10*3/uL (ref 0.7–4.0)
MCH: 30 pg (ref 26.0–34.0)
MCHC: 33.5 g/dL (ref 30.0–36.0)
MCV: 89.5 fL (ref 78.0–100.0)
MONOS PCT: 8 % (ref 3–12)
Monocytes Absolute: 0.6 10*3/uL (ref 0.1–1.0)
Neutro Abs: 3.6 10*3/uL (ref 1.7–7.7)
Neutrophils Relative %: 47 % (ref 43–77)
PLATELETS: 215 10*3/uL (ref 150–400)
RBC: 4.2 MIL/uL (ref 3.87–5.11)
RDW: 14.1 % (ref 11.5–15.5)
WBC: 7.5 10*3/uL (ref 4.0–10.5)

## 2013-03-01 LAB — POCT I-STAT TROPONIN I
TROPONIN I, POC: 0.03 ng/mL (ref 0.00–0.08)
TROPONIN I, POC: 0.03 ng/mL (ref 0.00–0.08)

## 2013-03-01 LAB — CREATININE, SERUM
CREATININE: 0.93 mg/dL (ref 0.50–1.10)
GFR calc Af Amer: 61 mL/min — ABNORMAL LOW (ref >90)
GFR, EST NON AFRICAN AMERICAN: 53 mL/min — AB (ref >90)

## 2013-03-01 LAB — CG4 I-STAT (LACTIC ACID): Lactic Acid, Venous: 1.15 mmol/L (ref 0.5–2.2)

## 2013-03-01 LAB — TROPONIN I: Troponin I: <0.3 ng/mL (ref <0.30)

## 2013-03-01 LAB — LIPASE, BLOOD: LIPASE: 16 U/L (ref 11–59)

## 2013-03-01 MED ORDER — ONDANSETRON HCL 4 MG/2ML IJ SOLN: 4.0000 mg | Freq: Four times a day (QID) | INTRAMUSCULAR | Status: DC | PRN

## 2013-03-01 MED ORDER — ONDANSETRON HCL 4 MG PO TABS: 4.0000 mg | ORAL_TABLET | Freq: Four times a day (QID) | ORAL | Status: DC | PRN

## 2013-03-01 MED ORDER — ASPIRIN 81 MG PO CHEW: 324.0000 mg | CHEWABLE_TABLET | Freq: Once | ORAL | Status: DC

## 2013-03-01 MED ORDER — ALUM & MAG HYDROXIDE-SIMETH 200-200-20 MG/5ML PO SUSP
30.0000 mL | Freq: Once | ORAL | Status: AC
  Administered 2013-03-01: 30 mL via ORAL
  Filled 2013-03-01: qty 30

## 2013-03-01 MED ORDER — ENOXAPARIN SODIUM 30 MG/0.3ML ~~LOC~~ SOLN
30.0000 mg | SUBCUTANEOUS | Status: DC
  Administered 2013-03-01: 30 mg via SUBCUTANEOUS
  Filled 2013-03-01 (×2): qty 0.3

## 2013-03-01 MED ORDER — ASPIRIN EC 81 MG PO TBEC
81.0000 mg | DELAYED_RELEASE_TABLET | Freq: Every day | ORAL | Status: DC
  Administered 2013-03-02: 81 mg via ORAL
  Filled 2013-03-01: qty 1

## 2013-03-01 MED ORDER — POLYETHYLENE GLYCOL 3350 17 G PO PACK
17.0000 g | PACK | ORAL | Status: DC
  Administered 2013-03-01: 17 g via ORAL
  Filled 2013-03-01: qty 1

## 2013-03-01 MED ORDER — MUSCLE RUB 10-15 % EX CREA
TOPICAL_CREAM | Freq: Two times a day (BID) | CUTANEOUS | Status: DC | PRN
  Filled 2013-03-01: qty 85

## 2013-03-01 MED ORDER — MEMANTINE HCL 10 MG PO TABS
10.0000 mg | ORAL_TABLET | Freq: Two times a day (BID) | ORAL | Status: DC
  Administered 2013-03-01 – 2013-03-02 (×2): 10 mg via ORAL
  Filled 2013-03-01 (×3): qty 1

## 2013-03-01 MED ORDER — IOHEXOL 300 MG/ML  SOLN: 25.0000 mL | INTRAMUSCULAR | Status: AC

## 2013-03-01 MED ORDER — MORPHINE SULFATE 2 MG/ML IJ SOLN: 2.0000 mg | INTRAMUSCULAR | Status: DC | PRN

## 2013-03-01 MED ORDER — CALCIUM CARBONATE-VITAMIN D 500-200 MG-UNIT PO TABS
1.0000 | ORAL_TABLET | Freq: Every day | ORAL | Status: DC
  Administered 2013-03-02: 1 via ORAL
  Filled 2013-03-01: qty 1

## 2013-03-01 MED ORDER — PROPYLENE GLYCOL 0.6 % OP SOLN: 1.0000 [drp] | Freq: Three times a day (TID) | OPHTHALMIC | Status: DC

## 2013-03-01 MED ORDER — MENTHOL (TOPICAL ANALGESIC) 4 % EX GEL: 1.0000 "application " | Freq: Two times a day (BID) | CUTANEOUS | Status: DC | PRN

## 2013-03-01 MED ORDER — PANTOPRAZOLE SODIUM 20 MG PO TBEC
20.0000 mg | DELAYED_RELEASE_TABLET | Freq: Every day | ORAL | Status: DC
Start: 2013-03-01 — End: 2013-03-02
  Administered 2013-03-02: 20 mg via ORAL
  Filled 2013-03-01 (×2): qty 1

## 2013-03-01 MED ORDER — ACETAMINOPHEN 650 MG RE SUPP: 650.0000 mg | Freq: Four times a day (QID) | RECTAL | Status: DC | PRN

## 2013-03-01 MED ORDER — OLOPATADINE HCL 0.1 % OP SOLN
1.0000 [drp] | Freq: Two times a day (BID) | OPHTHALMIC | Status: DC
  Administered 2013-03-02: 1 [drp] via OPHTHALMIC
  Filled 2013-03-01: qty 5

## 2013-03-01 MED ORDER — SODIUM CHLORIDE 0.9 % IJ SOLN: 3.0000 mL | INTRAMUSCULAR | Status: DC | PRN

## 2013-03-01 MED ORDER — POLYVINYL ALCOHOL 1.4 % OP SOLN
1.0000 [drp] | Freq: Three times a day (TID) | OPHTHALMIC | Status: DC
Start: 2013-03-01 — End: 2013-03-02
  Administered 2013-03-02: 1 [drp] via OPHTHALMIC
  Filled 2013-03-01: qty 15

## 2013-03-01 MED ORDER — HYDROCODONE-ACETAMINOPHEN 5-325 MG PO TABS: 1.0000 | ORAL_TABLET | ORAL | Status: DC | PRN

## 2013-03-01 MED ORDER — SODIUM CHLORIDE 0.9 % IV SOLN: 250.0000 mL | INTRAVENOUS | Status: DC | PRN

## 2013-03-01 MED ORDER — METOPROLOL TARTRATE 25 MG PO TABS
25.0000 mg | ORAL_TABLET | Freq: Two times a day (BID) | ORAL | Status: DC
Start: 2013-03-01 — End: 2013-03-02
  Administered 2013-03-01 – 2013-03-02 (×2): 25 mg via ORAL
  Filled 2013-03-01 (×3): qty 1

## 2013-03-01 MED ORDER — METOPROLOL TARTRATE 1 MG/ML IV SOLN: 2.5000 mg | Freq: Four times a day (QID) | INTRAVENOUS | Status: DC | PRN

## 2013-03-01 MED ORDER — SODIUM CHLORIDE 0.9 % IJ SOLN
3.0000 mL | Freq: Two times a day (BID) | INTRAMUSCULAR | Status: DC
  Administered 2013-03-01 – 2013-03-02 (×2): 3 mL via INTRAVENOUS

## 2013-03-01 MED ORDER — IOHEXOL 300 MG/ML  SOLN
100.0000 mL | Freq: Once | INTRAMUSCULAR | Status: AC | PRN
  Administered 2013-03-01: 100 mL via INTRAVENOUS

## 2013-03-01 MED ORDER — SODIUM CHLORIDE 0.9 % IJ SOLN: 3.0000 mL | Freq: Two times a day (BID) | INTRAMUSCULAR | Status: DC

## 2013-03-01 MED ORDER — VITAMIN B-12 1000 MCG PO TABS
1000.0000 ug | ORAL_TABLET | Freq: Every day | ORAL | Status: DC
  Administered 2013-03-02: 1000 ug via ORAL
  Filled 2013-03-01: qty 1

## 2013-03-01 MED ORDER — LISINOPRIL 5 MG PO TABS
5.0000 mg | ORAL_TABLET | Freq: Every day | ORAL | Status: DC
  Administered 2013-03-02: 5 mg via ORAL
  Filled 2013-03-01: qty 1

## 2013-03-01 MED ORDER — NITROGLYCERIN 2 % TD OINT
0.5000 [in_us] | TOPICAL_OINTMENT | Freq: Four times a day (QID) | TRANSDERMAL | Status: DC
  Filled 2013-03-01: qty 30

## 2013-03-01 MED ORDER — ACETAMINOPHEN 325 MG PO TABS
650.0000 mg | ORAL_TABLET | Freq: Four times a day (QID) | ORAL | Status: DC | PRN
Start: 2013-03-01 — End: 2013-03-02

## 2013-03-01 MED ORDER — SODIUM CHLORIDE 1 G PO TABS
1.0000 g | ORAL_TABLET | Freq: Two times a day (BID) | ORAL | Status: DC
  Administered 2013-03-01 – 2013-03-02 (×2): 1 g via ORAL
  Filled 2013-03-01 (×3): qty 1

## 2013-03-01 NOTE — ED Notes (Signed)
Patient transported to CT 

## 2013-03-01 NOTE — ED Notes (Signed)
Pt is drinking oral contrast for CT. Is still belching. Reporting her nausea has improved. Pt is very pleasant.

## 2013-03-01 NOTE — ED Notes (Signed)
Per EMS- Pt comes from [facility] c/o cp since yesterday and has been burping frequently. Describes as achy. Pt is a x 4. Given 324 aspirin, 2 nitro SL with no relief.

## 2013-03-01 NOTE — ED Provider Notes (Signed)
CSN: [id number]     Arrival date & time 03/01/13  1646 History   First MD Initiated Contact with Patient 03/01/13 1648     Chief Complaint  Patient presents with  . Chest Pain     Patient is a 80 y.o. female presenting with chest pain and abdominal pain.  Chest Pain Associated symptoms: abdominal pain, nausea and vomiting   Associated symptoms: no anorexia, no cough, no fatigue, no fever and no shortness of breath   Abdominal Pain Pain location:  Epigastric Pain quality: aching and bloating   Pain radiates to:  Does not radiate Pain severity:  Mild Onset quality:  Gradual Duration:  2 days Timing:  Intermittent Progression:  Waxing and waning Chronicity:  New Context: not alcohol use, not awakening from sleep, not diet changes, not eating and not laxative use   Relieved by:  None tried Worsened by:  Palpation Ineffective treatments: nitro and asa. Associated symptoms: belching, chest pain, nausea and vomiting   Associated symptoms: no anorexia, no chills, no constipation, no cough, no diarrhea, no dysuria, no fatigue, no fever, no flatus, no hematemesis, no hematochezia, no hematuria, no melena, no shortness of breath, no sore throat, no vaginal bleeding and no vaginal discharge   Risk factors: aspirin, being elderly and obesity       Past Medical History  Diagnosis Date  . Hypertension   . Hyperlipidemia   . Cardiomyopathy   . Pacemaker     [employer] [id number]  . PVD (peripheral vascular disease) 2007    stenting b legs 2007,   . Allergic rhinitis   . GERD (gastroesophageal reflux disease)   . Osteoporosis   . Osteoarthritis   . Gallstones     per US 11-2008, also no AAA  . CHF (congestive heart failure)   . Pacemaker    Past Surgical History  Procedure Laterality Date  . Pacemaker insertion      permanent   . Breast biopsy benign     Family History  Problem Relation Age of Onset  . Hypertension    . Breast cancer Neg Hx   . Colon cancer Neg Hx     History  Substance Use Topics  . Smoking status: Former Games developermoker  . Smokeless tobacco: Not on file  . Alcohol Use: No   OB History   Grav Para Term Preterm Abortions TAB SAB Ect Mult Living                 Review of Systems  Constitutional: Negative for fever, chills and fatigue.  HENT: Negative for sore throat.   Respiratory: Negative for cough and shortness of breath.   Cardiovascular: Positive for chest pain.  Gastrointestinal: Positive for nausea, vomiting and abdominal pain. Negative for diarrhea, constipation, melena, hematochezia, anorexia, flatus and hematemesis.  Genitourinary: Negative for dysuria, hematuria, vaginal bleeding and vaginal discharge.      Allergies  Amlodipine; Hydrocodone; and Penicillins  Home Medications   Current Outpatient Rx  Name  Route  Sig  Dispense  Refill  . acetaminophen (TYLENOL) 325 MG tablet   Oral   Take 650 mg by mouth every 6 (six) hours as needed for moderate pain or fever.         Marland Kitchen. aspirin EC 81 MG tablet   Oral   Take 81 mg by mouth daily.           . calcium-vitamin D (OSCAL WITH D) 500-200 MG-UNIT per tablet   Oral  Take 1 tablet by mouth daily.         . cyanocobalamin 500 MCG tablet   Oral   Take 1,000 mcg by mouth daily.         . fexofenadine (ALLEGRA) 180 MG tablet   Oral   Take 180 mg by mouth daily.         Marland Kitchen lisinopril (PRINIVIL,ZESTRIL) 5 MG tablet   Oral   Take 5 mg by mouth daily.         . memantine (NAMENDA) 10 MG tablet   Oral   Take 10 mg by mouth 2 (two) times daily.          . Menthol, Topical Analgesic, (BIOFREEZE) 4 % GEL   Topical   Apply 1 application topically 2 (two) times daily as needed (for osteoarthrosis). For pain.         Marland Kitchen Olopatadine HCl (PATADAY) 0.2 % SOLN   Both Eyes   Place 1 drop into both eyes daily.         . polyethylene glycol (MIRALAX / GLYCOLAX) packet   Oral   Take 17 g by mouth every other day.          Marland Kitchen Propylene Glycol 0.6 % SOLN    Both Eyes   Place 1 drop into both eyes 3 (three) times daily.         . sodium chloride 1 G tablet   Oral   Take 1 g by mouth 2 (two) times daily.          BP 177/63  Pulse 66  Temp(Src) 98.4 F (36.9 C) (Oral)  Resp 18  Ht 5\' 4"  (1.626 m)  Wt 183 lb (83.008 kg)  BMI 31.40 kg/m2  SpO2 97% Physical Exam  Constitutional: She is oriented to person, place, and time. She appears well-developed and well-nourished. No distress.  HENT:  Head: Normocephalic and atraumatic.  Nose: Nose normal.  Mouth/Throat: Oropharynx is clear and moist.  Eyes: EOM are normal. Pupils are equal, round, and reactive to light.  Neck: Normal range of motion. Neck supple. No tracheal deviation present.  Cardiovascular: Normal rate, regular rhythm, normal heart sounds and intact distal pulses.   Pulmonary/Chest: Effort normal and breath sounds normal. She has no rales.  Abdominal: Soft. Bowel sounds are normal. She exhibits no distension. There is tenderness ( epigastric). There is no rebound and no guarding.  Musculoskeletal: Normal range of motion. She exhibits no tenderness.  Neurological: She is alert and oriented to person, place, and time.  Skin: Skin is warm and dry. No rash noted.  Psychiatric: She has a normal mood and affect. Her behavior is normal.    ED Course  Procedures (including critical care time) Labs Review Labs Reviewed  CBC WITH DIFFERENTIAL - Abnormal; Notable for the following:    Eosinophils Relative 9 (*)    All other components within normal limits  HEPATIC FUNCTION PANEL - Abnormal; Notable for the following:    Albumin 3.3 (*)    All other components within normal limits  LIPASE, BLOOD  CG4 I-STAT (LACTIC ACID)  POCT I-STAT TROPONIN I  POCT I-STAT, CHEM 8   Results for orders placed during the hospital encounter of 03/01/13  CBC WITH DIFFERENTIAL      Result Value Ref Range   WBC 7.5  4.0 - 10.5 K/uL   RBC 4.20  3.87 - 5.11 MIL/uL   Hemoglobin 12.6  12.0 - 15.0  g/dL   HCT 37.6  36.0 - 46.0 %   MCV 89.5  78.0 - 100.0 fL   MCH 30.0  26.0 - 34.0 pg   MCHC 33.5  30.0 - 36.0 g/dL   RDW 12.2  44.9 - 75.3 %   Platelets 215  150 - 400 K/uL   Neutrophils Relative % 47  43 - 77 %   Neutro Abs 3.6  1.7 - 7.7 K/uL   Lymphocytes Relative 36  12 - 46 %   Lymphs Abs 2.7  0.7 - 4.0 K/uL   Monocytes Relative 8  3 - 12 %   Monocytes Absolute 0.6  0.1 - 1.0 K/uL   Eosinophils Relative 9 (*) 0 - 5 %   Eosinophils Absolute 0.6  0.0 - 0.7 K/uL   Basophils Relative 0  0 - 1 %   Basophils Absolute 0.0  0.0 - 0.1 K/uL  HEPATIC FUNCTION PANEL      Result Value Ref Range   Total Protein 7.5  6.0 - 8.3 g/dL   Albumin 3.3 (*) 3.5 - 5.2 g/dL   AST 28  0 - 37 U/L   ALT 14  0 - 35 U/L   Alkaline Phosphatase 102  39 - 117 U/L   Total Bilirubin 0.3  0.3 - 1.2 mg/dL   Bilirubin, Direct <0.0  0.0 - 0.3 mg/dL   Indirect Bilirubin NOT CALCULATED  0.3 - 0.9 mg/dL  LIPASE, BLOOD      Result Value Ref Range   Lipase 16  11 - 59 U/L  CG4 I-STAT (LACTIC ACID)      Result Value Ref Range   Lactic Acid, Venous 1.15  0.5 - 2.2 mmol/L  POCT I-STAT TROPONIN I      Result Value Ref Range   Troponin i, poc 0.03  0.00 - 0.08 ng/mL   Comment 3           POCT I-STAT, CHEM 8      Result Value Ref Range   Sodium 138  137 - 147 mEq/L   Potassium 4.5  3.7 - 5.3 mEq/L   Chloride 101  96 - 112 mEq/L   BUN 18  6 - 23 mg/dL   Creatinine, Ser 5.11  0.50 - 1.10 mg/dL   Glucose, Bld 93  70 - 99 mg/dL   Calcium, Ion 0.21  1.17 - 1.30 mmol/L   TCO2 26  0 - 100 mmol/L   Hemoglobin 14.3  12.0 - 15.0 g/dL   HCT 35.6  70.1 - 41.0 %     Imaging Review Dg Chest 2 View  03/01/2013   CLINICAL DATA:  Chest pain.  EXAM: CHEST  2 VIEW  COMPARISON:  Radiographs dated 06/15/2012 and 06/14/2012  FINDINGS: Two lead pacemaker in place. Heart size and pulmonary vascularity are normal. Slight tortuosity and calcification of the thoracic aorta. Lungs are clear. No effusions. No acute osseous abnormality.  Moderate arthritic changes of both shoulders.  IMPRESSION: No acute abnormality.   Electronically Signed   By: Geanie Cooley M.D.   On: 03/01/2013 18:46   Ct Abdomen Pelvis W Contrast  03/01/2013   CLINICAL DATA:  Pain.  Coughing.  EXAM: CT ABDOMEN AND PELVIS WITH CONTRAST  TECHNIQUE: Multidetector CT imaging of the abdomen and pelvis was performed using the standard protocol following bolus administration of intravenous contrast.  CONTRAST:  OMNIPAQUE IOHEXOL 300 MG/ML  SOLN  COMPARISON:  None.  FINDINGS: Liver normal. Spleen normal. Stenosis. Pancreas normal. No biliary distention. The gallbladder is  nondistended.  Adrenals normal. Multiple bilateral simple renal cysts. 6 mm calcified right renal caliceal stone, lower pole. No hydronephrosis. The bladder is nondistended. Calcified phleboliths. 2 x 2.9 cm simple right ovarian cyst.  No significant adenopathy. Aortic and visceral vascular atherosclerotic disease. No aneurysm.  Appendix normal. No inflammatory changes in the right or left lower quadrant. Diverticulosis. No evidence of diverticulitis. No bowel distention. Large amount of stool in rectum. Stomach is nondistended. No mesenteric masses. No free air.  Mild basilar atelectasis and/or scarring. Cardiac pacer. Diffuse severe degenerative changes thoracic spine and both hips. Grade 1-2 L3 on L4 spondylolisthesis.  IMPRESSION: 1. Nonobstructive calcified right renal calyceal stone. 2. 2 x 2.9 cm simple right ovarian cyst. 3. Mild bibasilar atelectasis and/or scarring 4. Diverticulosis.  No definite evidence of diverticulitis.   Electronically Signed   By: Maisie Fus  Register   On: 03/01/2013 19:42    EKG Interpretation    Date/Time:  Tuesday March 01 2013 16:52:09 EST Ventricular Rate:  79 PR Interval:  318 QRS Duration: 158 QT Interval:  422 QTC Calculation: 484 R Axis:   -72 Text Interpretation:  Sinus or ectopic atrial rhythm Prolonged PR interval Nonspecific IVCD with LAD LVH with  secondary repolarization abnormality Anterolateral infarct, age indeterminate Similar to previous. Confirmed by Jodi Mourning  MD, JOSHUA ([id number]) on 03/01/2013 5:15:48 PM            MDM   Final diagnoses:  Chest pain  Dementia with behavioral disturbance  History of cardiomyopathy  Other and unspecified hyperlipidemia   80 yo F in NAD AFVSS who presents from [facility] facility with frequent burping and epigastric discomfort. Patient also endorses nausea and one episode of NBNB emesis. Patient was given ASA 324 mg and 2 nitro by EMS without any relief. She denies any radiation of pain into her chest, no sob, pain reproducible on exam over the epigastric area. HEART score of 4 given age and risk factors.  Cannot r/o atypical presentation of ACS but feel it is less likely. Will obtain labs , EKG and CXR. Zofran and fluids.   8:52 PM CT CAP without acute findings to explain patients epigastric pain. Troponin wnl. EKG unchanged from prior. CXR with NACPD. Case discussed with my attending Dr. Jodi Mourning. Case discussed with hospitalist on call.   Patient admitted for further workup of CP/epigastric pain.     Nadara Mustard, MD 03/01/13 [phone]

## 2013-03-01 NOTE — ED Notes (Signed)
CT called and made aware pt is finished with her oral contrast.

## 2013-03-01 NOTE — H&P (Addendum)
[facility]                                                                                    Patient Demographics  [name], is a 80 y.o. female  CSN: [id number]  MRN: [id number]  DOB - 02/19/1922  Admit Date - 03/01/2013  Outpatient Primary MD for the patient is REED, TIFFANY, DO   With History of -  Past Medical History  Diagnosis Date  . Hypertension   . Hyperlipidemia   . Cardiomyopathy   . Pacemaker     [employer] [id number]  . PVD (peripheral vascular disease) 2007    stenting b legs 2007,   . Allergic rhinitis   . GERD (gastroesophageal reflux disease)   . Osteoporosis   . Osteoarthritis   . Gallstones     per US 11-2008, also no AAA  . CHF (congestive heart failure)   . Pacemaker       Past Surgical History  Procedure Laterality Date  . Pacemaker insertion      permanent   . Breast biopsy benign      in for   Chief Complaint  Patient presents with  . Chest Pain     HPI  Ashley PasseyGertrude Dolby  is a 80 y.o. female, with past medical history significant for hypertension, hyperlipidemia, cardiomyopathy, sick sinus syndrome status post pacemaker placement presenting today with 2 days history of chest pain episodes, on and off associated with nausea, no shortness of breath or cold sweats. Patient denies diarrhea but reports constipation at times. The chest pain is a retrosternal nonradiating, pressure like.  patient received 2 nitroglycerin sublinguals and aspirin with no improvement by EMS however she improved in the emergency room with Zofran .I was called to admit .    Review of Systems    In addition to the HPI above,  No Fever-chills, No Headache, No changes with Vision or hearing, No problems swallowing food or Liquids, No Blood in stool or Urine, No dysuria, No new skin rashes or bruises, No new joints pains-aches,  No new weakness, tingling, numbness in any extremity, No recent weight gain or loss, No polyuria,  polydypsia or polyphagia, No significant Mental Stressors.  A full 10 point Review of Systems was done, except as stated above, all other Review of Systems were negative.   Social History History  Substance Use Topics  . Smoking status: Former Games developermoker  . Smokeless tobacco: Not on file  . Alcohol Use: No     Family History Family History  Problem Relation Age of Onset  . Hypertension    . Breast cancer Neg Hx   . Colon cancer Neg Hx     Prior to Admission medications   Medication Sig Start Date End Date Taking? Authorizing Provider  acetaminophen (TYLENOL) 325 MG tablet Take 650 mg by mouth every 6 (six) hours as needed for moderate pain or fever.   Yes Historical Provider, MD  aspirin EC 81 MG tablet Take 81 mg by mouth daily.     Yes Historical Provider, MD  calcium-vitamin D (OSCAL WITH D) 500-200 MG-UNIT per tablet Take 1 tablet by mouth daily.  Yes Historical Provider, MD  cyanocobalamin 500 MCG tablet Take 1,000 mcg by mouth daily.   Yes Historical Provider, MD  fexofenadine (ALLEGRA) 180 MG tablet Take 180 mg by mouth daily.   Yes Historical Provider, MD  lisinopril (PRINIVIL,ZESTRIL) 5 MG tablet Take 5 mg by mouth daily.   Yes Historical Provider, MD  memantine (NAMENDA) 10 MG tablet Take 10 mg by mouth 2 (two) times daily.    Yes Historical Provider, MD  Menthol, Topical Analgesic, (BIOFREEZE) 4 % GEL Apply 1 application topically 2 (two) times daily as needed (for osteoarthrosis). For pain.   Yes Historical Provider, MD  Olopatadine HCl (PATADAY) 0.2 % SOLN Place 1 drop into both eyes daily.   Yes Historical Provider, MD  polyethylene glycol (MIRALAX / GLYCOLAX) packet Take 17 g by mouth every other day.    Yes Historical Provider, MD  Propylene Glycol 0.6 % SOLN Place 1 drop into both eyes 3 (three) times daily.   Yes Historical Provider, MD  sodium chloride 1 G tablet Take 1 g by mouth 2 (two) times daily.   Yes Historical Provider, MD    Allergies  Allergen  Reactions  . Amlodipine Other (See Comments)    Dizziness & elevated BP  . Hydrocodone Itching  . Penicillins Other (See Comments)    Per MAR    Physical Exam  Vitals  Blood pressure 195/76, pulse 74, temperature 98.4 F (36.9 C), temperature source Oral, resp. rate 17, height 5\' 4"  (1.626 m), weight 83.008 kg (183 lb), SpO2 97.00%.   1. General elderly African American female very pleasant in no acute distress  2. Normal affect and insight, Not Suicidal or Homicidal,  3. No F.N deficits, ALL C.Nerves Intact, Strength 5/5 all 4 extremities, Sensation intact all 4 extremities, Plantars down going.  4. Ears and Eyes appear Normal, Conjunctivae clear, PERRLA. Moist Oral Mucosa.  5. Supple Neck, No JVD, No cervical lymphadenopathy appriciated, No Carotid Bruits.  6. Symmetrical Chest wall movement, Good air movement bilaterally, CTAB.  7. RRR, No Gallops, Rubs or Murmurs, No Parasternal Heave.  8. Positive Bowel Sounds, Abdomen Soft, Non tender, No organomegaly appriciated,No rebound -guarding or rigidity.  9.  No Cyanosis, Normal Skin Turgor, No Skin Rash or Bruise.  10. Good muscle tone,  joints appear normal , no effusions, Normal ROM.  11. No Palpable Lymph Nodes in Neck or Axillae    Data Review  CBC  Recent Labs Lab 03/01/13 1650 03/01/13 1818  WBC 7.5  --   HGB 12.6 14.3  HCT 37.6 42.0  PLT 215  --   MCV 89.5  --   MCH 30.0  --   MCHC 33.5  --   RDW 14.1  --   LYMPHSABS 2.7  --   MONOABS 0.6  --   EOSABS 0.6  --   BASOSABS 0.0  --    ------------------------------------------------------------------------------------------------------------------  Chemistries   Recent Labs Lab 03/01/13 1701 03/01/13 1818  NA  --  138  K  --  4.5  CL  --  101  GLUCOSE  --  93  BUN  --  18  CREATININE  --  0.90  AST 28  --   ALT 14  --   ALKPHOS 102  --   BILITOT 0.3  --     ------------------------------------------------------------------------------------------------------------------ estimated creatinine clearance is 44.2 ml/min (by C-G formula based on Cr of 0.9).   Imaging results:   Dg Chest 2 View  03/01/2013   CLINICAL  DATA:  Chest pain.  EXAM: CHEST  2 VIEW  COMPARISON:  Radiographs dated 06/15/2012 and 06/14/2012  FINDINGS: Two lead pacemaker in place. Heart size and pulmonary vascularity are normal. Slight tortuosity and calcification of the thoracic aorta. Lungs are clear. No effusions. No acute osseous abnormality. Moderate arthritic changes of both shoulders.  IMPRESSION: No acute abnormality.   Electronically Signed   By: Geanie Cooley M.D.   On: 03/01/2013 18:46   Ct Abdomen Pelvis W Contrast  03/01/2013   CLINICAL DATA:  Pain.  Coughing.  EXAM: CT ABDOMEN AND PELVIS WITH CONTRAST  TECHNIQUE: Multidetector CT imaging of the abdomen and pelvis was performed using the standard protocol following bolus administration of intravenous contrast.  CONTRAST:  OMNIPAQUE IOHEXOL 300 MG/ML  SOLN  COMPARISON:  None.  FINDINGS: Liver normal. Spleen normal. Stenosis. Pancreas normal. No biliary distention. The gallbladder is nondistended.  Adrenals normal. Multiple bilateral simple renal cysts. 6 mm calcified right renal caliceal stone, lower pole. No hydronephrosis. The bladder is nondistended. Calcified phleboliths. 2 x 2.9 cm simple right ovarian cyst.  No significant adenopathy. Aortic and visceral vascular atherosclerotic disease. No aneurysm.  Appendix normal. No inflammatory changes in the right or left lower quadrant. Diverticulosis. No evidence of diverticulitis. No bowel distention. Large amount of stool in rectum. Stomach is nondistended. No mesenteric masses. No free air.  Mild basilar atelectasis and/or scarring. Cardiac pacer. Diffuse severe degenerative changes thoracic spine and both hips. Grade 1-2 L3 on L4 spondylolisthesis.  IMPRESSION: 1.  Nonobstructive calcified right renal calyceal stone. 2. 2 x 2.9 cm simple right ovarian cyst. 3. Mild bibasilar atelectasis and/or scarring 4. Diverticulosis.  No definite evidence of diverticulitis.   Electronically Signed   By: Maisie Fus  Register   On: 03/01/2013 19:42    My personal review of EKG: Paced rhythm at the rate of 79 beats per minute with no acute changes    Assessment & Plan  1. retrosternal chest pain     Serial troponins     Nitro paste to anterior chest wall     History of sick sinus syndrome status post pacemaker     Negative CT of abdomen except for ovarian cyst and nonobstx renal calculi . 2. Hypertension uncontrolled     Nitro paste to anterior chest wall     Lopressor twice a day 3. History of dementia     Continue with Namenda 4.CMP/HL/GERD   DVT Prophylaxis Lovenox  AM Labs Ordered, also please review Full Orders  Code Status DO NOT RESUSCITATE  Disposition Plan: Back to nursing home  Time spent in minutes : 32 minutes  Condition GUARDED

## 2013-03-01 NOTE — ED Notes (Signed)
RESIDENT NOTIFIED OF PATIENTS LAB RESULTS OF I-STAT TROPONIN ,CG4+ LACTIC ACID ,@17 :55 PM ,03/01/2013.

## 2013-03-02 DIAGNOSIS — I10 Essential (primary) hypertension: Secondary | ICD-10-CM

## 2013-03-02 DIAGNOSIS — Z950 Presence of cardiac pacemaker: Secondary | ICD-10-CM

## 2013-03-02 DIAGNOSIS — R0789 Other chest pain: Secondary | ICD-10-CM | POA: Diagnosis not present

## 2013-03-02 DIAGNOSIS — IMO0002 Reserved for concepts with insufficient information to code with codable children: Secondary | ICD-10-CM

## 2013-03-02 DIAGNOSIS — I442 Atrioventricular block, complete: Secondary | ICD-10-CM

## 2013-03-02 LAB — TROPONIN I: Troponin I: <0.3 ng/mL (ref <0.30)

## 2013-03-02 LAB — MRSA PCR SCREENING: MRSA by PCR: NEGATIVE

## 2013-03-02 MED ORDER — PANTOPRAZOLE SODIUM 40 MG PO TBEC: 40.0000 mg | DELAYED_RELEASE_TABLET | Freq: Every day | ORAL | Status: DC

## 2013-03-02 MED ORDER — ENOXAPARIN SODIUM 40 MG/0.4ML ~~LOC~~ SOLN
40.0000 mg | SUBCUTANEOUS | Status: DC
  Filled 2013-03-02: qty 0.4

## 2013-03-02 NOTE — Progress Notes (Signed)
CSW (Clinical Social Worker) prepared pt dc packet and placed with shadow chart. CSW arranged non-emergent ambulance transport. Pt, pt nurse, and facility informed. CSW signing off.  [name], LCSWA [phone]  

## 2013-03-02 NOTE — Progress Notes (Signed)
Clinical Social Work Department BRIEF PSYCHOSOCIAL ASSESSMENT 03/02/2013  Patient:  [name], [name]     Account Number:  [id number]     Admit date:  03/01/2013  Clinical Social Worker:  Harless Nakayama  Date/Time:  03/02/2013 09:45 AM  Referred by:  Physician  Date Referred:  03/02/2013 Referred for  SNF Placement   Other Referral:   Interview type:  Patient Other interview type:    PSYCHOSOCIAL DATA Living Status:  FACILITY Admitted from facility:  [facility] [place] Level of care:  Skilled Nursing Facility Primary support name:  [name] [phone] Primary support relationship to patient:  FAMILY Degree of support available:   Pt has supportive family and her main contact is her niece    CURRENT CONCERNS Current Concerns  Post-Acute Placement   Other Concerns:    SOCIAL WORK ASSESSMENT / PLAN CSW informed by RN that pt was admitted from facility. CSW spoke with pt who informed CSW she was admitted from [facility]. Pt reports being at the facility for 3.5 years and reported to CSW that this is her home and she is eager to return. CSW asked if any family/friends should be contacted. Pt said this was not necessary as the facility had already called and updated her niece and they would do so again when she returned. Pt will need non-emergent ambulance transport back. CSW called facility and confirmed that pt can return today if medically stable.   Assessment/plan status:  Psychosocial Support/Ongoing Assessment of Needs Other assessment/ plan:   Information/referral to community resources:   None needed    PATIENT'S/FAMILY'S RESPONSE TO PLAN OF CARE: Pt is agreeable to return to SNF       Brena Windsor, LCSWA [phone]

## 2013-03-02 NOTE — Significant Event (Signed)
Patient discharged to [facility], via ambulance. VS stable prior to discharge. Reviewed discharge instructions with patient and a copy of discharge instruction went with patient.  Patient's only belonging is a pajama top which is taken with her. Report given to Barron at [facility]. Tadeo Besecker, Charity fundraiser.

## 2013-03-02 NOTE — Discharge Summary (Signed)
Physician Discharge Summary  [name] OVZ:[id number] DOB: January 21, 1922 DOA: 03/01/2013  PCP: Bufford Spikes, DO  Admit date: 03/01/2013 Discharge date: 03/02/2013  Time spent: 40 minutes  Recommendations for Outpatient Follow-up:  1. Followup with nursing home physician within one week  Discharge Diagnoses:  Principal Problem:   Chest pain   Discharge Condition: Stable  Diet recommendation: Heart healthy diet  Filed Weights   03/01/13 1659 03/01/13 2147  Weight: 83.008 kg (183 lb) 83.416 kg (183 lb 14.4 oz)    History of present illness:  [name] is a 80 y.o. female, with past medical history significant for hypertension, hyperlipidemia, cardiomyopathy, sick sinus syndrome status post pacemaker placement presenting today with 2 days history of chest pain episodes, on and off associated with nausea, no shortness of breath or cold sweats. Patient denies diarrhea but reports constipation at times. The chest pain is a retrosternal nonradiating, pressure like.  patient received 2 nitroglycerin sublinguals and aspirin with no improvement by EMS however she improved in the emergency room with Zofran .I was called to admit .  Hospital Course:   1. Chest pain: Atypical chest pain, retrosternal but not pressure like or relieved by nitroglycerin. The pain is rather epigastric and related to some nausea. After admission to the hospital patient started on nitroglycerin paste without interval leave, that the patient is chest pain free. Has history of sick sinus syndrome status post pacemaker but no complaints of palpitations, shortness of breath or PND. Patient seen by cardiology, she has negative 3 sets of cardiac enzymes so cardiology recommended followup as outpatient with her cardiologist Dr. Ladona Ridgel. I think the pain she is having is epigastric pain might be related to GI, patient started on PPI on discharge.  2. Hypertension: Blood pressure was in the high side at the time of admission  and went up to 195/76, patient is on lisinopril she said she did not take it yesterday, high-pressure is likely secondary to not taking the medication along with pain. Blood pressure medication restarted, this needs close followup as outpatient as she might need more antihypertensives if blood pressure continues to be high.  3. History of dementia: Patient is on Namenda, patient seems very reasonable this morning without confusion.  4. Nursing home resident: Patient said she's been nursing home resident for the past 3.5 years secondary to arthritis and inability to ambulate by herself.  Procedures:  None  Consultations:  Cardiology  Discharge Exam: Filed Vitals:   03/02/13 0940  BP: 129/52  Pulse: 65  Temp:   Resp:    General: Alert and awake, oriented x3, not in any acute distress. HEENT: anicteric sclera, pupils reactive to light and accommodation, EOMI CVS: S1-S2 clear, no murmur rubs or gallops Chest: clear to auscultation bilaterally, no wheezing, rales or rhonchi Abdomen: soft nontender, nondistended, normal bowel sounds, no organomegaly Extremities: no cyanosis, clubbing or edema noted bilaterally Neuro: Cranial nerves II-XII intact, no focal neurological deficits  Discharge Instructions  Discharge Orders   Future Orders Complete By Expires   Diet - low sodium heart healthy  As directed    Increase activity slowly  As directed        Medication List         acetaminophen 325 MG tablet  Commonly known as:  TYLENOL  Take 650 mg by mouth every 6 (six) hours as needed for moderate pain or fever.     aspirin EC 81 MG tablet  Take 81 mg by mouth daily.  BIOFREEZE 4 % Gel  Generic drug:  Menthol (Topical Analgesic)  Apply 1 application topically 2 (two) times daily as needed (for osteoarthrosis). For pain.     calcium-vitamin D 500-200 MG-UNIT per tablet  Commonly known as:  OSCAL WITH D  Take 1 tablet by mouth daily.     cyanocobalamin 500 MCG tablet   Take 1,000 mcg by mouth daily.     fexofenadine 180 MG tablet  Commonly known as:  ALLEGRA  Take 180 mg by mouth daily.     lisinopril 5 MG tablet  Commonly known as:  PRINIVIL,ZESTRIL  Take 5 mg by mouth daily.     memantine 10 MG tablet  Commonly known as:  NAMENDA  Take 10 mg by mouth 2 (two) times daily.     pantoprazole 40 MG tablet  Commonly known as:  PROTONIX  Take 1 tablet (40 mg total) by mouth daily.     PATADAY 0.2 % Soln  Generic drug:  Olopatadine HCl  Place 1 drop into both eyes daily.     polyethylene glycol packet  Commonly known as:  MIRALAX / GLYCOLAX  Take 17 g by mouth every other day.     Propylene Glycol 0.6 % Soln  Place 1 drop into both eyes 3 (three) times daily.     sodium chloride 1 G tablet  Take 1 g by mouth 2 (two) times daily.       Allergies  Allergen Reactions  . Amlodipine Other (See Comments)    Dizziness & elevated BP  . Hydrocodone Itching  . Penicillins Other (See Comments)    Per MAR      The results of significant diagnostics from this hospitalization (including imaging, microbiology, ancillary and laboratory) are listed below for reference.    Significant Diagnostic Studies: Dg Chest 2 View  03/01/2013   CLINICAL DATA:  Chest pain.  EXAM: CHEST  2 VIEW  COMPARISON:  Radiographs dated 06/15/2012 and 06/14/2012  FINDINGS: Two lead pacemaker in place. Heart size and pulmonary vascularity are normal. Slight tortuosity and calcification of the thoracic aorta. Lungs are clear. No effusions. No acute osseous abnormality. Moderate arthritic changes of both shoulders.  IMPRESSION: No acute abnormality.   Electronically Signed   By: Geanie CooleyJim  Maxwell M.D.   On: 03/01/2013 18:46   Ct Abdomen Pelvis W Contrast  03/01/2013   CLINICAL DATA:  Pain.  Coughing.  EXAM: CT ABDOMEN AND PELVIS WITH CONTRAST  TECHNIQUE: Multidetector CT imaging of the abdomen and pelvis was performed using the standard protocol following bolus administration of  intravenous contrast.  CONTRAST:  100mL OMNIPAQUE IOHEXOL 300 MG/ML  SOLN  COMPARISON:  None.  FINDINGS: Liver normal. Spleen normal. Stenosis. Pancreas normal. No biliary distention. The gallbladder is nondistended.  Adrenals normal. Multiple bilateral simple renal cysts. 6 mm calcified right renal caliceal stone, lower pole. No hydronephrosis. The bladder is nondistended. Calcified phleboliths. 2 x 2.9 cm simple right ovarian cyst.  No significant adenopathy. Aortic and visceral vascular atherosclerotic disease. No aneurysm.  Appendix normal. No inflammatory changes in the right or left lower quadrant. Diverticulosis. No evidence of diverticulitis. No bowel distention. Large amount of stool in rectum. Stomach is nondistended. No mesenteric masses. No free air.  Mild basilar atelectasis and/or scarring. Cardiac pacer. Diffuse severe degenerative changes thoracic spine and both hips. Grade 1-2 L3 on L4 spondylolisthesis.  IMPRESSION: 1. Nonobstructive calcified right renal calyceal stone. 2. 2 x 2.9 cm simple right ovarian cyst. 3. Mild bibasilar atelectasis and/or  scarring 4. Diverticulosis.  No definite evidence of diverticulitis.   Electronically Signed   By: Maisie Fus  Register   On: 03/01/2013 19:42    Microbiology: Recent Results (from the past 240 hour(s))  MRSA PCR SCREENING     Status: None   Collection Time    03/01/13 10:14 PM      Result Value Ref Range Status   MRSA by PCR NEGATIVE  NEGATIVE Final   Comment:            The GeneXpert MRSA Assay (FDA     approved for NASAL specimens     only), is one component of a     comprehensive MRSA colonization     surveillance program. It is not     intended to diagnose MRSA     infection nor to guide or     monitor treatment for     MRSA infections.     Labs: Basic Metabolic Panel:  Recent Labs Lab 03/01/13 1818 03/01/13 2223  NA 138  --   K 4.5  --   CL 101  --   GLUCOSE 93  --   BUN 18  --   CREATININE 0.90 0.93   Liver Function  Tests:  Recent Labs Lab 03/01/13 1701  AST 28  ALT 14  ALKPHOS 102  BILITOT 0.3  PROT 7.5  ALBUMIN 3.3*    Recent Labs Lab 03/01/13 1701  LIPASE 16   No results found for this basename: AMMONIA,  in the last 168 hours CBC:  Recent Labs Lab 03/01/13 1650 03/01/13 1818 03/01/13 2223  WBC 7.5  --  7.3  NEUTROABS 3.6  --   --   HGB 12.6 14.3 12.4  HCT 37.6 42.0 36.8  MCV 89.5  --  90.0  PLT 215  --  239   Cardiac Enzymes:  Recent Labs Lab 03/01/13 2223 03/02/13 0506 03/02/13 0956  TROPONINI <0.30 <0.30 <0.30   BNP: BNP (last 3 results)  Recent Labs  06/09/12 2325  PROBNP 506.8*   CBG: No results found for this basename: GLUCAP,  in the last 168 hours     Signed:  Trevious Rampey A  [facility] 03/02/2013, 11:37 AM

## 2013-03-02 NOTE — Consult Note (Signed)
Admit date: 03/01/2013 Referring Physician  Dr. Sharyon MedicusHijazi Primary Physician REED, TIFFANY, DO Primary Cardiologist  Dr. Ladona Ridgelaylor Reason for Consultation  Chest pain  HPI: 80 year-old with pacemaker, sick sinus syndrome, peripheral vascular disease status post stenting in 2007 bilateral legs here with chest discomfort. She described a chest tightness off and on over the past 2 days associated with some nausea but no shortness of breath, no diaphoresis. Occasional constipation noted. Chest pain resolved in emergency department. She received 2 nitroglycerin by EMS which originally did not improve her symptoms but when she was here in the emergency department, her symptoms subsided with Zofran.  Currently she is eating breakfast in bed, no complaints.. Happy.    PMH:   Past Medical History  Diagnosis Date  . Hypertension   . Hyperlipidemia   . Cardiomyopathy   . Pacemaker     [employer] [id number]  . PVD (peripheral vascular disease) 2007    stenting b legs 2007,   . Allergic rhinitis   . GERD (gastroesophageal reflux disease)   . Osteoporosis   . Osteoarthritis   . Gallstones     per US 11-2008, also no AAA  . CHF (congestive heart failure)   . Pacemaker     PSH:   Past Surgical History  Procedure Laterality Date  . Pacemaker insertion      permanent   . Breast biopsy benign     Allergies:  Amlodipine; Hydrocodone; and Penicillins Prior to Admit Meds:   Prior to Admission medications   Medication Sig Start Date End Date Taking? Authorizing Provider  acetaminophen (TYLENOL) 325 MG tablet Take 650 mg by mouth every 6 (six) hours as needed for moderate pain or fever.   Yes Historical Provider, MD  aspirin EC 81 MG tablet Take 81 mg by mouth daily.     Yes Historical Provider, MD  calcium-vitamin D (OSCAL WITH D) 500-200 MG-UNIT per tablet Take 1 tablet by mouth daily.   Yes Historical Provider, MD  cyanocobalamin 500 MCG tablet Take 1,000 mcg by mouth daily.   Yes  Historical Provider, MD  fexofenadine (ALLEGRA) 180 MG tablet Take 180 mg by mouth daily.   Yes Historical Provider, MD  lisinopril (PRINIVIL,ZESTRIL) 5 MG tablet Take 5 mg by mouth daily.   Yes Historical Provider, MD  memantine (NAMENDA) 10 MG tablet Take 10 mg by mouth 2 (two) times daily.    Yes Historical Provider, MD  Menthol, Topical Analgesic, (BIOFREEZE) 4 % GEL Apply 1 application topically 2 (two) times daily as needed (for osteoarthrosis). For pain.   Yes Historical Provider, MD  Olopatadine HCl (PATADAY) 0.2 % SOLN Place 1 drop into both eyes daily.   Yes Historical Provider, MD  polyethylene glycol (MIRALAX / GLYCOLAX) packet Take 17 g by mouth every other day.    Yes Historical Provider, MD  Propylene Glycol 0.6 % SOLN Place 1 drop into both eyes 3 (three) times daily.   Yes Historical Provider, MD  sodium chloride 1 G tablet Take 1 g by mouth 2 (two) times daily.   Yes Historical Provider, MD   Fam HX:    Family History  Problem Relation Age of Onset  . Hypertension    . Breast cancer Neg Hx   . Colon cancer Neg Hx    Social HX:    History   Social History  . Marital Status: Widowed    Spouse Name: N/A    Number of Children: N/A  . Years  of Education: N/A   Occupational History  . retired    Social History Main Topics  . Smoking status: Former Games developer  . Smokeless tobacco: Not on file  . Alcohol Use: No  . Drug Use: Not on file  . Sexual Activity: Not on file   Other Topics Concern  . Not on file   Social History Narrative   Comes to the office frequently with her nephew [name]...   Lives by her self...   ADL independent...   Still drives...   No children but has family around...Marland KitchenMarland KitchenMarland Kitchen   End of life issues discussed  Today 05-16-10, again she does not like heroic measures, intubation or CPR.     ROS:  All 11 ROS were addressed and are negative except what is stated in the HPI   Physical Exam: Blood pressure 139/53, pulse 59, temperature 97.7 F (36.5 C),  temperature source Oral, resp. rate 20, height 5\' 4"  (1.626 m), weight 183 lb 14.4 oz (83.416 kg), SpO2 100.00%.   General: Well developed, well nourished, elderly in no acute distress Head: Eyes PERRLA, No xanthomas.   Normal cephalic and atramatic  Lungs:   Clear bilaterally to auscultation and percussion. Normal respiratory effort. No wheezes, no rales. Heart:   HRRR S1 S2 Pulses are 2+ & equal. No murmur, rubs, gallops.  No carotid bruit. No JVD.  No abdominal bruits.  Abdomen: Bowel sounds are positive, abdomen soft and non-tender without masses. No hepatosplenomegaly. Msk:  Back normal. Normal strength and tone for age. Extremities:  No clubbing, cyanosis or edema.  DP +1 Neuro: Alert and oriented X 3, non-focal, MAE x 4 GU: Deferred Rectal: Deferred Psych:  Good affect, responds appropriately      Labs: Lab Results  Component Value Date   WBC 7.3 03/01/2013   HGB 12.4 03/01/2013   HCT 36.8 03/01/2013   MCV 90.0 03/01/2013   PLT 239 03/01/2013     Recent Labs Lab 03/01/13 1701  03/01/13 1818 03/01/13 2223  NA  --   --  138  --   K  --   --  4.5  --   CL  --   --  101  --   BUN  --   --  18  --   CREATININE  --   < > 0.90 0.93  PROT 7.5  --   --   --   BILITOT 0.3  --   --   --   ALKPHOS 102  --   --   --   ALT 14  --   --   --   AST 28  --   --   --   GLUCOSE  --   --  93  --   < > = values in this interval not displayed.  Recent Labs  03/01/13 2223 03/02/13 0506  TROPONINI <0.30 <0.30   Lab Results  Component Value Date   CHOL 167 05/16/2010   HDL 55.90 05/16/2010   LDLCALC 101* 05/16/2010   TRIG 49.0 05/16/2010   Lab Results  Component Value Date   DDIMER 2.88* 10/26/2010     Radiology:  Dg Chest 2 View  03/01/2013   CLINICAL DATA:  Chest pain.  EXAM: CHEST  2 VIEW  COMPARISON:  Radiographs dated 06/15/2012 and 06/14/2012  FINDINGS: Two lead pacemaker in place. Heart size and pulmonary vascularity are normal. Slight tortuosity and calcification of the  thoracic aorta. Lungs are clear. No effusions. No acute osseous  abnormality. Moderate arthritic changes of both shoulders.  IMPRESSION: No acute abnormality.   Electronically Signed   By: Geanie Cooley M.D.   On: 03/01/2013 18:46   Ct Abdomen Pelvis W Contrast  03/01/2013   CLINICAL DATA:  Pain.  Coughing.  EXAM: CT ABDOMEN AND PELVIS WITH CONTRAST  TECHNIQUE: Multidetector CT imaging of the abdomen and pelvis was performed using the standard protocol following bolus administration of intravenous contrast.  CONTRAST:  OMNIPAQUE IOHEXOL 300 MG/ML  SOLN  COMPARISON:  None.  FINDINGS: Liver normal. Spleen normal. Stenosis. Pancreas normal. No biliary distention. The gallbladder is nondistended.  Adrenals normal. Multiple bilateral simple renal cysts. 6 mm calcified right renal caliceal stone, lower pole. No hydronephrosis. The bladder is nondistended. Calcified phleboliths. 2 x 2.9 cm simple right ovarian cyst.  No significant adenopathy. Aortic and visceral vascular atherosclerotic disease. No aneurysm.  Appendix normal. No inflammatory changes in the right or left lower quadrant. Diverticulosis. No evidence of diverticulitis. No bowel distention. Large amount of stool in rectum. Stomach is nondistended. No mesenteric masses. No free air.  Mild basilar atelectasis and/or scarring. Cardiac pacer. Diffuse severe degenerative changes thoracic spine and both hips. Grade 1-2 L3 on L4 spondylolisthesis.  IMPRESSION: 1. Nonobstructive calcified right renal calyceal stone. 2. 2 x 2.9 cm simple right ovarian cyst. 3. Mild bibasilar atelectasis and/or scarring 4. Diverticulosis.  No definite evidence of diverticulitis.   Electronically Signed   By: Maisie Fus  Register   On: 03/01/2013 19:42   Personally viewed.  EKG:  First degree AV block, sinus rhythm, left bundle branch block/IVCD. I do not see clear pacer spikes. Likely sensing atrial beat. Personally viewed.   ECHO: 06/10/12 - Left ventricle: Technically limited.  The cavity size was normal. Wall thickness was increased in a pattern of mild LVH. The estimated ejection fraction was 60%. Wall motion was normal; there were no regional wall motion abnormalities. - Aortic valve: Sclerosis without stenosis. - Mitral valve: Mildly calcified annulus. - Right ventricle: Poorly visualized. Suspect RV function is OK. I can not see well enough to comment on pacemaker mentioned in the history. - Pulmonary arteries: PA peak pressure: 32mm Hg (S).   ASSESSMENT/PLAN:   80 year old female with chest tightness, pacemaker, PVD.  1. Chest pain-currently resolved, troponins negative x3. EKG unremarkable. She is currently feeling well, eating breakfast. No further cardiac testing necessary. She is fine with this plan. Recent echocardiogram in May of 2014 demonstrated normal EF.  2. Peripheral vascular disease-continue with secondary prevention.  3. Pacemaker-appears to be functioning well. Dr. Ladona Ridgel monitoring.  4. Dementia-on current medications reviewed.  Continue with current clinical followup plan already in place by Dr. Ladona Ridgel.  Donato Schultz, MD  03/02/2013  8:08 AM

## 2013-03-03 NOTE — ED Provider Notes (Signed)
Medical screening examination/treatment/procedure(s) were conducted as a shared visit with non-physician practitioner(s) or resident  and myself.  I personally evaluated the patient during the encounter and agree with the findings and plan unless otherwise indicated.    I have personally reviewed any xrays and/ or EKG's with the provider and I agree with interpretation.   Epigastric pain, non radiating and vomiting.  Mild epigastric tender on exam, no guarding, ? Lower chest pain anterior. Non toxic appearing. Abdo and cardiac workup, pt brought in for observation.    Epigastric pain  Enid Skeens, MD 03/03/13 [phone]

## 2013-03-04 ENCOUNTER — Encounter: Payer: Self-pay | Admitting: Internal Medicine

## 2013-03-04 ENCOUNTER — Non-Acute Institutional Stay (SKILLED_NURSING_FACILITY): Payer: Medicare Other | Admitting: Internal Medicine

## 2013-03-04 DIAGNOSIS — M1990 Unspecified osteoarthritis, unspecified site: Secondary | ICD-10-CM

## 2013-03-04 DIAGNOSIS — F0391 Unspecified dementia with behavioral disturbance: Secondary | ICD-10-CM

## 2013-03-04 DIAGNOSIS — F03918 Unspecified dementia, unspecified severity, with other behavioral disturbance: Secondary | ICD-10-CM

## 2013-03-04 DIAGNOSIS — I10 Essential (primary) hypertension: Secondary | ICD-10-CM

## 2013-03-04 DIAGNOSIS — I442 Atrioventricular block, complete: Secondary | ICD-10-CM

## 2013-03-04 DIAGNOSIS — R079 Chest pain, unspecified: Secondary | ICD-10-CM

## 2013-03-06 ENCOUNTER — Encounter: Payer: Self-pay | Admitting: Internal Medicine

## 2013-03-06 NOTE — Progress Notes (Signed)
MRN: [id number] Name: [name]  Sex: female Age: 80 y.o. DOB: 02/19/1922  [facility] Facility[facility]:  [facility] Level Of Care: SNF Provider: Merrilee SeashoreALEXANDER, Dakiya Puopolo D Emergency Contacts: Extended Emergency Contact Information Primary Emergency Contact: Jackson,Flossie Address: [address]          [place], [place] Home Phone: [phone] Mobile Phone: [phone] Relation: Niece Secondary Emergency Contact: Gwynn,John&Karen  [place] Home Phone: [phone] Relation: Nephew  Code Status:   Allergies: Amlodipine; Hydrocodone; and Penicillins  Chief Complaint  Patient presents with  . nursing home admission    HPI: Patient is 80 y.o. female who was hospitalized for CP with a negative work-up.  Past Medical History  Diagnosis Date  . Hypertension   . Hyperlipidemia   . Cardiomyopathy   . Pacemaker     [employer] [id number]  . PVD (peripheral vascular disease) 2007    stenting b legs 2007,   . Allergic rhinitis   . GERD (gastroesophageal reflux disease)   . Osteoporosis   . Osteoarthritis   . Gallstones     per US 11-2008, also no AAA  . CHF (congestive heart failure)   . Pacemaker     Past Surgical History  Procedure Laterality Date  . Pacemaker insertion      permanent   . Breast biopsy benign        Medication List       This list is accurate as of: 03/04/13 11:59 PM.  Always use your most recent med list.               acetaminophen 325 MG tablet  Commonly known as:  TYLENOL  Take 650 mg by mouth every 6 (six) hours as needed for moderate pain or fever.     aspirin EC 81 MG tablet  Take 81 mg by mouth daily.     BIOFREEZE 4 % Gel  Generic drug:  Menthol (Topical Analgesic)  Apply 1 application topically 2 (two) times daily as needed (for osteoarthrosis). For pain.     calcium-vitamin D 500-200 MG-UNIT per tablet  Commonly known as:  OSCAL WITH D  Take 1 tablet by mouth daily.      cyanocobalamin 500 MCG tablet  Take 1,000 mcg by mouth daily.     fexofenadine 180 MG tablet  Commonly known as:  ALLEGRA  Take 180 mg by mouth daily.     lisinopril 5 MG tablet  Commonly known as:  PRINIVIL,ZESTRIL  Take 5 mg by mouth daily.     memantine 10 MG tablet  Commonly known as:  NAMENDA  Take 10 mg by mouth 2 (two) times daily.     pantoprazole 40 MG tablet  Commonly known as:  PROTONIX  Take 1 tablet (40 mg total) by mouth daily.     PATADAY 0.2 % Soln  Generic drug:  Olopatadine HCl  Place 1 drop into both eyes daily.     polyethylene glycol packet  Commonly known as:  MIRALAX / GLYCOLAX  Take 17 g by mouth every other day.     Propylene Glycol 0.6 % Soln  Place 1 drop into both eyes 3 (three) times daily.     sodium chloride 1 G tablet  Take 1 g by mouth 2 (two) times daily.        No orders of the defined types were placed in this encounter.    Immunization History  Administered Date(s) Administered  .  Influenza Whole 11/05/2009, 10/25/2012  . Pneumococcal Polysaccharide-23 08/24/2000, 03/03/2008  . Pneumococcal-Unspecified 06/21/2012  . Td 06/17/2001    History  Substance Use Topics  . Smoking status: Former Games developer  . Smokeless tobacco: Not on file  . Alcohol Use: No    Family history is noncontributory    Review of Systems  DATA OBTAINED: from patient GENERAL: Feels well no fevers, fatigue, appetite changes SKIN: No itching, rash or wounds EYES: No eye pain, redness, discharge EARS: No earache, tinnitus, change in hearing NOSE: No congestion, drainage or bleeding  MOUTH/THROAT: No mouth or tooth pain, No sore throat, No difficulty chewing or swallowing  RESPIRATORY: No cough, wheezing, SOB CARDIAC: No chest pain, palpitations, lower extremity edema  GI: No abdominal pain, No N/V/D or constipation, No heartburn or reflux  GU: No dysuria, frequency or urgency, or incontinence  MUSCULOSKELETAL: No unrelieved bone/joint  pain NEUROLOGIC: No headache, dizziness or focal weakness PSYCHIATRIC: No overt anxiety or sadness. Sleeps well. No behavior issue.   Filed Vitals:   03/04/13 2231  BP: 155/87  Pulse: 68  Temp: 98.6 F (37 C)  Resp: 17    Physical Exam  GENERAL APPEARANCE: Alert, conversant. Appropriately groomed. No acute distress.  SKIN: No diaphoresis rash, or wounds HEAD: Normocephalic, atraumatic  EYES: Conjunctiva/lids clear. Pupils round, reactive. EOMs intact.  EARS: External exam WNL, canals clear. Hearing grossly normal.  NOSE: No deformity or discharge.  MOUTH/THROAT: Lips w/o lesion  RESPIRATORY: Breathing is even, unlabored. Lung sounds are clear   CARDIOVASCULAR: Heart RRR no murmurs, rubs or gallops. No peripheral edema.   GASTROINTESTINAL: Abdomen is soft, non-tender, not distended w/ normal bowel sounds GENITOURINARY: Bladder non tender, not distended  MUSCULOSKELETAL: No abnormal joints or musculature NEUROLOGIC: Oriented X3. Cranial nerves 2-12 grossly intact. Moves all extremities no tremor. PSYCHIATRIC: Mood and affect appropriate to situation, no behavioral issues  Patient Active Problem List   Diagnosis Date Noted  . Chest pain 03/01/2013  . Anemia of chronic disease 02/22/2013  . Psychosis 08/20/2012  . Hyponatremia 07/15/2012  . Mouth ulcers 06/15/2012  . Dehydration 06/14/2012  . FTT (failure to thrive) in adult 06/14/2012  . Abnormal urinalysis 06/14/2012  . Agitated 06/14/2012  . Palliative care encounter 06/14/2012  . Hypotension 06/13/2012  . Acute respiratory failure with hypoxia 06/13/2012  . Altered mental state 06/09/2012  . Dementia with behavioral disturbance 04/28/2012  . Constipation 08/26/2010  . Weight loss 08/26/2010  . General medical examination 05/16/2010  . PRURITUS 03/14/2010  . OTHER B-COMPLEX DEFICIENCIES 09/19/2009  . CHOLELITHIASIS 12/13/2008  . AV BLOCK, COMPLETE 05/03/2008  . BRADYCARDIA 05/03/2008  . OSTEOARTHRITIS 10/18/2007   . OSTEOPOROSIS 06/15/2007  . DIZZINESS 03/05/2007  . HYPERLIPIDEMIA 12/18/2006  . History of cardiomyopathy 06/17/2006  . PVD 06/17/2006  . HYPERTENSION 06/12/2006  . ALLERGIC RHINITIS 06/12/2006  . GERD 06/12/2006  . PACEMAKER-[employer] 06/12/2006    CBC    Component Value Date/Time   WBC 7.3 03/01/2013 2223   RBC 4.09 03/01/2013 2223   HGB 12.4 03/01/2013 2223   HCT 36.8 03/01/2013 2223   PLT 239 03/01/2013 2223   MCV 90.0 03/01/2013 2223   LYMPHSABS 2.7 03/01/2013 1650   MONOABS 0.6 03/01/2013 1650   EOSABS 0.6 03/01/2013 1650   BASOSABS 0.0 03/01/2013 1650    CMP     Component Value Date/Time   NA 138 03/01/2013 1818   K 4.5 03/01/2013 1818   CL 101 03/01/2013 1818   CO2 22 06/15/2012 0330  GLUCOSE 93 03/01/2013 1818   GLUCOSE 83 08/31/2009 1451   BUN 18 03/01/2013 1818   CREATININE 0.93 03/01/2013 2223   CALCIUM 7.8* 06/15/2012 0330   PROT 7.5 03/01/2013 1701   ALBUMIN 3.3* 03/01/2013 1701   AST 28 03/01/2013 1701   ALT 14 03/01/2013 1701   ALKPHOS 102 03/01/2013 1701   BILITOT 0.3 03/01/2013 1701   GFRNONAA 53* 03/01/2013 2223   GFRAA 61* 03/01/2013 2223    Assessment and Plan  Chest pain ED presentation but cardiac enzymes neg;pt stated on protonox  HYPERTENSION BP noted to be high in hospital. D/c on pre-hosp lisinipril which may need to ne increased  Dementia with behavioral disturbance Continue namenda  AV BLOCK, COMPLETE Pacemaker    Ashley Hanks, MD

## 2013-03-06 NOTE — Assessment & Plan Note (Signed)
Continue namenda 

## 2013-03-06 NOTE — Assessment & Plan Note (Signed)
Pacemaker  

## 2013-03-06 NOTE — Assessment & Plan Note (Signed)
BP noted to be high in hospital. D/c on pre-hosp lisinipril which may need to ne increased

## 2013-03-06 NOTE — Assessment & Plan Note (Signed)
ED presentation but cardiac enzymes neg;pt stated on protonox

## 2013-04-11 ENCOUNTER — Non-Acute Institutional Stay (SKILLED_NURSING_FACILITY): Payer: Medicare Other | Admitting: Nurse Practitioner

## 2013-04-11 DIAGNOSIS — J309 Allergic rhinitis, unspecified: Secondary | ICD-10-CM

## 2013-04-11 DIAGNOSIS — M810 Age-related osteoporosis without current pathological fracture: Secondary | ICD-10-CM

## 2013-04-11 DIAGNOSIS — K219 Gastro-esophageal reflux disease without esophagitis: Secondary | ICD-10-CM

## 2013-04-11 DIAGNOSIS — M1990 Unspecified osteoarthritis, unspecified site: Secondary | ICD-10-CM

## 2013-04-11 DIAGNOSIS — I10 Essential (primary) hypertension: Secondary | ICD-10-CM

## 2013-04-11 NOTE — Progress Notes (Signed)
Patient ID: [name], female   DOB: 1922-07-17, 80 y.o.   MRN: [id number]   Nursing Home Location:  [facility]   Place of Service: SNF (31)  PCP: REED, TIFFANY, DO  Allergies  Allergen Reactions  . Amlodipine Other (See Comments)    Dizziness & elevated BP  . Hydrocodone Itching  . Penicillins Other (See Comments)    Per [facility]    Chief Complaint  Patient presents with  . Medical Managment of Chronic Issues    HPI:  Patient seen today for routine visit and medical management of multiple medical disorders including hypertension, dementia, osteoporosis, osteoarthritis, PVD, allergic rhinitis, anemia, hld, weight loss, and gerd. Patient c/o increase in seasonal allergy complaints. "I get this every year". Recently her Joyce Copa was stopped, and both patient and nursing staff report an increase in rhinorrhea including watery, itchy eyes, sneezing, itchy throat, and clear nasal discharge. She also c/o chronic nail fungus and appearance of nails. No skin fungus reported. Reports joint pain to lower legs, hands, arms and chronic arthritis. Continues to use Biofreeze prn and Voltaren gel to affected joints. "Not strong enough" per patient report. BP also trending with systolic readings often >150. Started on lisinopril last month and has been tolerating without side effect.   Review of Systems:  Review of Systems  Constitutional: Negative.  Negative for fever, chills, weight loss and malaise/fatigue.  Eyes: Positive for redness (trace right eye). Negative for blurred vision. Eye discharge: clear watery right eye.  Respiratory: Positive for cough. Negative for sputum production (occasionally clear sputum), shortness of breath and wheezing.   Cardiovascular: Negative for chest pain, palpitations and leg swelling.  Gastrointestinal: Negative for diarrhea and constipation.  Genitourinary: Negative for dysuria.  Musculoskeletal: Positive for joint pain and myalgias. Negative  for falls.  Skin: Negative for itching.       Nail fungus to fingernails/toenails. Appears chronic.  Neurological: Negative for dizziness, tremors and sensory change.  Endo/Heme/Allergies: Does not bruise/bleed easily.  Psychiatric/Behavioral: Negative for depression and memory loss. The patient is not nervous/anxious and does not have insomnia.      Past Medical History  Diagnosis Date  . Hypertension   . Hyperlipidemia   . Cardiomyopathy   . Pacemaker     [place] [employer] [id number]  . PVD (peripheral vascular disease) 2007    stenting b legs 2007,   . Allergic rhinitis   . GERD (gastroesophageal reflux disease)   . Osteoporosis   . Osteoarthritis   . Gallstones     per [place] 11-2008, also no AAA  . CHF (congestive heart failure)   . Pacemaker    Past Surgical History  Procedure Laterality Date  . Pacemaker insertion      permanent   . Breast biopsy benign     Social History:   reports that she has quit smoking. She does not have any smokeless tobacco history on file. She reports that she does not drink alcohol. Her drug history is not on file.  Family History  Problem Relation Age of Onset  . Hypertension    . Breast cancer Neg Hx   . Colon cancer Neg Hx     Medications: Patient's Medications  New Prescriptions   No medications on file  Previous Medications   ACETAMINOPHEN (TYLENOL) 325 MG TABLET    Take 650 mg by mouth every 6 (six) hours as needed for moderate pain or fever.   ASPIRIN EC 81 MG TABLET  Take 81 mg by mouth daily.     CALCIUM-VITAMIN D (OSCAL WITH D) 500-200 MG-UNIT PER TABLET    Take 1 tablet by mouth daily.   CYANOCOBALAMIN 500 MCG TABLET    Take 1,000 mcg by mouth daily.   FEXOFENADINE (ALLEGRA) 180 MG TABLET    Take 180 mg by mouth daily.   LISINOPRIL (PRINIVIL,ZESTRIL) 5 MG TABLET    Take 5 mg by mouth daily.   MEMANTINE (NAMENDA) 10 MG TABLET    Take 10 mg by mouth 2 (two) times daily.    MENTHOL, TOPICAL ANALGESIC, (BIOFREEZE) 4 % GEL     Apply 1 application topically 2 (two) times daily as needed (for osteoarthrosis). For pain.   OLOPATADINE HCL (PATADAY) 0.2 % SOLN    Place 1 drop into both eyes daily.   PANTOPRAZOLE (PROTONIX) 40 MG TABLET    Take 1 tablet (40 mg total) by mouth daily.   POLYETHYLENE GLYCOL (MIRALAX / GLYCOLAX) PACKET    Take 17 g by mouth every other day.    PROPYLENE GLYCOL 0.6 % SOLN    Place 1 drop into both eyes 3 (three) times daily.   SODIUM CHLORIDE 1 G TABLET    Take 1 g by mouth 2 (two) times daily.  Modified Medications   No medications on file  Discontinued Medications   No medications on file     Physical Exam: Filed Vitals:   04/11/13 1122  BP: 154/77  Pulse: 63  Temp: 97.6 F (36.4 C)  Resp: 18   Physical Exam  Nursing note and vitals reviewed. Constitutional: She is well-developed, well-nourished, and in no distress. No distress.  HENT:  Right Ear: External ear normal.  Left Ear: External ear normal.  Nose: Nose normal.  Mouth/Throat: Oropharynx is clear and moist. No oropharyngeal exudate.  Eyes: Pupils are equal, round, and reactive to light. Right eye exhibits discharge (clear watery, right>left). No scleral icterus.  Right mild conjunctival redness   Cardiovascular: Normal rate, regular rhythm and intact distal pulses.   Pulmonary/Chest: Effort normal and breath sounds normal. No respiratory distress. She has no wheezes.  Abdominal: Soft. Bowel sounds are normal. She exhibits no distension. There is no tenderness.  Musculoskeletal: Normal range of motion. She exhibits no edema.  Lymphadenopathy:    She has no cervical adenopathy.  Neurological: She exhibits normal muscle tone. Coordination normal.  Skin: Skin is warm and dry. No rash noted. She is not diaphoretic.  Toenail/fingernails with yellow fissured, chronic fungal appearance. Nails thickened.  Psychiatric: Mood, memory, affect and judgment normal.      Labs reviewed: Basic Metabolic Panel:  Recent  Labs  06/13/12 2218 06/14/12 0510 06/15/12 0330 03/01/13 1818 03/01/13 2223  NA 129* 130* 135 138  --   K 5.4* 5.4* 4.1 4.5  --   CL 96 101 105 101  --   CO2 20 20 22   --   --   GLUCOSE 148* 106* 73 93  --   BUN 40* 38* 25* 18  --   CREATININE 1.29* 0.99 0.84 0.90 0.93  CALCIUM 9.3 8.6 7.8*  --   --    Liver Function Tests:  Recent Labs  03/01/13 1701  AST 28  ALT 14  ALKPHOS 102  BILITOT 0.3  PROT 7.5  ALBUMIN 3.3*    Recent Labs  03/01/13 1701  LIPASE 16   No results found for this basename: AMMONIA,  in the last 8760 hours CBC:  Recent Labs  06/09/12  2135  06/13/12 1750 06/14/12 0510 03/01/13 1650 03/01/13 1818 03/01/13 2223  WBC 7.8  < > 7.6 8.0 7.5  --  7.3  NEUTROABS 5.1  --  5.8  --  3.6  --   --   HGB 13.0  < > 12.1 11.0* 12.6 14.3 12.4  HCT 38.0  < > 34.8* 31.8* 37.6 42.0 36.8  MCV 85.4  < > 86.1 84.8 89.5  --  90.0  PLT 210  < > 234 226 215  --  239  < > = values in this interval not displayed. Cardiac Enzymes:  Recent Labs  03/01/13 2223 03/02/13 0506 03/02/13 0956  TROPONINI <0.30 <0.30 <0.30   BNP: No components found with this basename: POCBNP,  CBG:  Recent Labs  06/09/12 1938  GLUCAP 97   TSH:  Recent Labs  06/09/12 2325  TSH 0.949   A1C: Lab Results  Component Value Date   HGBA1C 5.3 06/09/2012     Assessment/Plan  1. HYPERTENSION Will increase lisinopril to 10mg  daily and monitor for side effects. Will send BMP to look at renal function.   2. ALLERGIC RHINITIS Will restart Allegra daily and monitor response.   3. GERD Patient currently without GI symptoms. Will continue Protonix 40mg  tablet and follow up as needed.   4. OSTEOARTHRITIS Patient continues to have moderate joint pain despite topical therapies including NSAID gel. Will add scheduled tylenol 650 BID to regimen.   5. OSTEOPOROSIS Continues on Oscal with vitamin D. Will check Vitamin D level with labs.

## 2013-04-20 ENCOUNTER — Encounter: Payer: Self-pay | Admitting: Internal Medicine

## 2013-04-20 ENCOUNTER — Non-Acute Institutional Stay (SKILLED_NURSING_FACILITY): Payer: Medicare Other | Admitting: Internal Medicine

## 2013-04-20 DIAGNOSIS — B351 Tinea unguium: Secondary | ICD-10-CM

## 2013-04-20 NOTE — Progress Notes (Signed)
Patient ID: Ashley LouisGertrude K Patterson, female   DOB: 02/19/1922, 80 y.o.   MRN: [id number]   [facility] Provider:  Gwenith Spitziffany L. Renato Gailseed, D.O., C.M.D.  Code Status:  DNR  Chief Complaint  Patient presents with  . 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    Review of Systems:  Review of Systems  Constitutional: Negative for fever and chills.  Eyes: Negative for blurred vision.  Cardiovascular: Negative for chest pain and leg swelling.  Gastrointestinal: Negative for heartburn.  Genitourinary: Negative for dysuria.  Musculoskeletal: Positive for joint pain. Negative for falls.  Skin: Negative for rash.       Nail changes, thickening and splitting both fingers and nails  Neurological: Negative for dizziness and headaches.  Endo/Heme/Allergies: Bruises/bleeds easily.  Psychiatric/Behavioral: Positive for memory loss.    Medications: Patient's Medications  New Prescriptions   No medications on file  Previous Medications   ACETAMINOPHEN (TYLENOL) 325 MG TABLET    Take 650 mg by mouth every 6 (six) hours as needed for moderate pain or fever.   ASPIRIN EC 81 MG TABLET    Take 81 mg by mouth daily.     CALCIUM-VITAMIN D (OSCAL WITH D) 500-200 MG-UNIT PER TABLET    Take 1 tablet by mouth daily.   CYANOCOBALAMIN 500 MCG TABLET    Take 1,000 mcg by mouth daily.   FEXOFENADINE (ALLEGRA) 180 MG TABLET    Take 180 mg by mouth daily.   LISINOPRIL (PRINIVIL,ZESTRIL) 5 MG TABLET    Take 5 mg by mouth daily.   MEMANTINE (NAMENDA) 10 MG TABLET    Take 10 mg by mouth 2 (two) times daily.    MENTHOL, TOPICAL ANALGESIC, (BIOFREEZE) 4 % GEL    Apply 1 application topically 2 (two) times daily as needed (for osteoarthrosis). For pain.   OLOPATADINE  HCL (PATADAY) 0.2 % SOLN    Place 1 drop into both eyes daily.   PANTOPRAZOLE (PROTONIX) 40 MG TABLET    Take 1 tablet (40 mg total) by mouth daily.   POLYETHYLENE GLYCOL (MIRALAX / GLYCOLAX) PACKET    Take 17 g by mouth every other day.    PROPYLENE GLYCOL 0.6 % SOLN    Place 1 drop into both eyes 3 (three) times daily.   SODIUM CHLORIDE 1 G TABLET    Take 1 g by mouth 2 (two) times daily.  Modified Medications   No medications on file  Discontinued Medications   No medications on file    Physical Exam: Filed Vitals:   04/20/13 1645  BP: 145/80  Pulse: 77  Temp: 97.9 F (36.6 C)  Resp: 17  Height: 5\' 4"  (1.626 m)  Weight: 191 lb (86.637 kg)   Physical Exam  Constitutional: She appears well-developed and well-nourished. No distress.  Cardiovascular: Normal rate and regular rhythm.   Pulmonary/Chest: Effort normal and breath sounds normal.  Skin: Skin is warm and dry.  Nails of all fingers are thickened and some are splitting down the shaft, toenails also thickened and onychomycotic    Labs reviewed: Basic Metabolic Panel:  Recent Labs  78/29/5604/01/27 2218 06/14/12 0510 06/15/12 0330 03/01/13 1818 03/01/13 2223  NA 129* 130* 135 138  --  K 5.4* 5.4* 4.1 4.5  --   CL 96 101 105 101  --   CO2 20 20 22   --   --   GLUCOSE 148* 106* 73 93  --   BUN 40* 38* 25* 18  --   CREATININE 1.29* 0.99 0.84 0.90 0.93  CALCIUM 9.3 8.6 7.8*  --   --     Liver Function Tests:  Recent Labs  03/01/13 1701  AST 28  ALT 14  ALKPHOS 102  BILITOT 0.3  PROT 7.5  ALBUMIN 3.3*    CBC:  Recent Labs  06/09/12 2135  06/13/12 1750 06/14/12 0510 03/01/13 1650 03/01/13 1818 03/01/13 2223  WBC 7.8  < > 7.6 8.0 7.5  --  7.3  NEUTROABS 5.1  --  5.8  --  3.6  --   --   HGB 13.0  < > 12.1 11.0* 12.6 14.3 12.4  HCT 38.0  < > 34.8* 31.8* 37.6 42.0 36.8  MCV 85.4  < > 86.1 84.8 89.5  --  90.0  PLT 210  < > 234 226 215  --  239  < > = values in this interval not  displayed.  Assessment/Plan 1. Onychomycosis due to dermatophyte -will check liver panel for baseline -if this is normal, will start lamisil treatment due to severity of her nail and toenail fungus -monitor liver panel and cbc after starting--would check in 2 wks after starting and dosing and course will be lamisil 250mg  po daily for 12 wks  Labs/tests ordered:  Liver panel next draw  Staphanie Harbison L. Cayne Yom, D.O. Geriatrics [facility] [address] [place], [place] Facility phone [phone]):  [phone] Facility fax:  [phone] Cell Phone (Mon-Fri 8am-5pm):  [phone] On Call (follow prompts after 5pm & weekends):  [phone]

## 2013-06-20 ENCOUNTER — Inpatient Hospital Stay (HOSPITAL_COMMUNITY)
Admission: EM | Admit: 2013-06-20 | Discharge: 2013-06-23 | DRG: 689 | Disposition: A | Discharge status: Skilled Nursing Facility | Payer: Medicare Other | Attending: Internal Medicine | Admitting: Internal Medicine

## 2013-06-20 ENCOUNTER — Encounter (HOSPITAL_COMMUNITY): Payer: Self-pay | Admitting: Emergency Medicine

## 2013-06-20 ENCOUNTER — Emergency Department (HOSPITAL_COMMUNITY): Payer: Medicare Other

## 2013-06-20 DIAGNOSIS — I739 Peripheral vascular disease, unspecified: Secondary | ICD-10-CM | POA: Diagnosis present

## 2013-06-20 DIAGNOSIS — K219 Gastro-esophageal reflux disease without esophagitis: Secondary | ICD-10-CM

## 2013-06-20 DIAGNOSIS — M810 Age-related osteoporosis without current pathological fracture: Secondary | ICD-10-CM

## 2013-06-20 DIAGNOSIS — I428 Other cardiomyopathies: Secondary | ICD-10-CM | POA: Diagnosis present

## 2013-06-20 DIAGNOSIS — Z8679 Personal history of other diseases of the circulatory system: Secondary | ICD-10-CM

## 2013-06-20 DIAGNOSIS — F29 Unspecified psychosis not due to a substance or known physiological condition: Secondary | ICD-10-CM

## 2013-06-20 DIAGNOSIS — I959 Hypotension, unspecified: Secondary | ICD-10-CM

## 2013-06-20 DIAGNOSIS — I498 Other specified cardiac arrhythmias: Secondary | ICD-10-CM

## 2013-06-20 DIAGNOSIS — E785 Hyperlipidemia, unspecified: Secondary | ICD-10-CM | POA: Diagnosis present

## 2013-06-20 DIAGNOSIS — R8290 Unspecified abnormal findings in urine: Secondary | ICD-10-CM

## 2013-06-20 DIAGNOSIS — D638 Anemia in other chronic diseases classified elsewhere: Secondary | ICD-10-CM

## 2013-06-20 DIAGNOSIS — Z79899 Other long term (current) drug therapy: Secondary | ICD-10-CM

## 2013-06-20 DIAGNOSIS — R634 Abnormal weight loss: Secondary | ICD-10-CM

## 2013-06-20 DIAGNOSIS — B351 Tinea unguium: Secondary | ICD-10-CM

## 2013-06-20 DIAGNOSIS — R42 Dizziness and giddiness: Secondary | ICD-10-CM

## 2013-06-20 DIAGNOSIS — N390 Urinary tract infection, site not specified: Principal | ICD-10-CM | POA: Diagnosis present

## 2013-06-20 DIAGNOSIS — J309 Allergic rhinitis, unspecified: Secondary | ICD-10-CM

## 2013-06-20 DIAGNOSIS — J9601 Acute respiratory failure with hypoxia: Secondary | ICD-10-CM

## 2013-06-20 DIAGNOSIS — G9340 Encephalopathy, unspecified: Secondary | ICD-10-CM | POA: Diagnosis present

## 2013-06-20 DIAGNOSIS — F03918 Unspecified dementia, unspecified severity, with other behavioral disturbance: Secondary | ICD-10-CM | POA: Diagnosis present

## 2013-06-20 DIAGNOSIS — Z515 Encounter for palliative care: Secondary | ICD-10-CM

## 2013-06-20 DIAGNOSIS — K5900 Constipation, unspecified: Secondary | ICD-10-CM

## 2013-06-20 DIAGNOSIS — L299 Pruritus, unspecified: Secondary | ICD-10-CM

## 2013-06-20 DIAGNOSIS — I10 Essential (primary) hypertension: Secondary | ICD-10-CM | POA: Diagnosis present

## 2013-06-20 DIAGNOSIS — E871 Hypo-osmolality and hyponatremia: Secondary | ICD-10-CM | POA: Diagnosis present

## 2013-06-20 DIAGNOSIS — R4182 Altered mental status, unspecified: Secondary | ICD-10-CM

## 2013-06-20 DIAGNOSIS — F0391 Unspecified dementia with behavioral disturbance: Secondary | ICD-10-CM | POA: Diagnosis present

## 2013-06-20 DIAGNOSIS — K8020 Calculus of gallbladder without cholecystitis without obstruction: Secondary | ICD-10-CM

## 2013-06-20 DIAGNOSIS — N179 Acute kidney failure, unspecified: Secondary | ICD-10-CM | POA: Diagnosis present

## 2013-06-20 DIAGNOSIS — E538 Deficiency of other specified B group vitamins: Secondary | ICD-10-CM

## 2013-06-20 DIAGNOSIS — E860 Dehydration: Secondary | ICD-10-CM

## 2013-06-20 DIAGNOSIS — Z7982 Long term (current) use of aspirin: Secondary | ICD-10-CM

## 2013-06-20 DIAGNOSIS — I442 Atrioventricular block, complete: Secondary | ICD-10-CM

## 2013-06-20 DIAGNOSIS — K121 Other forms of stomatitis: Secondary | ICD-10-CM

## 2013-06-20 DIAGNOSIS — E875 Hyperkalemia: Secondary | ICD-10-CM | POA: Diagnosis present

## 2013-06-20 DIAGNOSIS — M1990 Unspecified osteoarthritis, unspecified site: Secondary | ICD-10-CM

## 2013-06-20 DIAGNOSIS — R079 Chest pain, unspecified: Secondary | ICD-10-CM

## 2013-06-20 DIAGNOSIS — Z950 Presence of cardiac pacemaker: Secondary | ICD-10-CM

## 2013-06-20 DIAGNOSIS — A498 Other bacterial infections of unspecified site: Secondary | ICD-10-CM | POA: Diagnosis present

## 2013-06-20 DIAGNOSIS — R627 Adult failure to thrive: Secondary | ICD-10-CM | POA: Diagnosis present

## 2013-06-20 DIAGNOSIS — R451 Restlessness and agitation: Secondary | ICD-10-CM

## 2013-06-20 DIAGNOSIS — Z87891 Personal history of nicotine dependence: Secondary | ICD-10-CM

## 2013-06-20 HISTORY — DX: Psychotic disorder with delusions due to known physiological condition: F06.2

## 2013-06-20 HISTORY — DX: Unspecified dementia, unspecified severity, without behavioral disturbance, psychotic disturbance, mood disturbance, and anxiety: F03.90

## 2013-06-20 LAB — URINALYSIS, ROUTINE W REFLEX MICROSCOPIC
Bilirubin Urine: NEGATIVE
Glucose, UA: NEGATIVE mg/dL
Ketones, ur: NEGATIVE mg/dL
Nitrite: NEGATIVE
PH: 6.5 (ref 5.0–8.0)
Protein, ur: 30 mg/dL — AB
SPECIFIC GRAVITY, URINE: 1.018 (ref 1.005–1.030)
Urobilinogen, UA: 1 mg/dL (ref 0.0–1.0)

## 2013-06-20 LAB — COMPREHENSIVE METABOLIC PANEL
ALT: 12 U/L (ref 0–35)
AST: 26 U/L (ref 0–37)
Albumin: 4.4 g/dL (ref 3.5–5.2)
Alkaline Phosphatase: 93 U/L (ref 39–117)
BUN: 32 mg/dL — AB (ref 6–23)
CO2: 21 meq/L (ref 19–32)
CREATININE: 1.62 mg/dL — AB (ref 0.50–1.10)
Calcium: 9.6 mg/dL (ref 8.4–10.5)
Chloride: 93 mEq/L — ABNORMAL LOW (ref 96–112)
GFR calc Af Amer: 31 mL/min — ABNORMAL LOW (ref >90)
GFR, EST NON AFRICAN AMERICAN: 27 mL/min — AB (ref >90)
Glucose, Bld: 110 mg/dL — ABNORMAL HIGH (ref 70–99)
Potassium: 5.3 mEq/L (ref 3.7–5.3)
Sodium: 128 mEq/L — ABNORMAL LOW (ref 137–147)
Total Bilirubin: 0.2 mg/dL — ABNORMAL LOW (ref 0.3–1.2)
Total Protein: 8.2 g/dL (ref 6.0–8.3)

## 2013-06-20 LAB — CBC
HEMATOCRIT: 36.3 % (ref 36.0–46.0)
Hemoglobin: 12.4 g/dL (ref 12.0–15.0)
MCH: 29.4 pg (ref 26.0–34.0)
MCHC: 34.2 g/dL (ref 30.0–36.0)
MCV: 86 fL (ref 78.0–100.0)
PLATELETS: 269 10*3/uL (ref 150–400)
RBC: 4.22 MIL/uL (ref 3.87–5.11)
RDW: 13.2 % (ref 11.5–15.5)
WBC: 7.9 10*3/uL (ref 4.0–10.5)

## 2013-06-20 LAB — URINE MICROSCOPIC-ADD ON

## 2013-06-20 LAB — RAPID URINE DRUG SCREEN, HOSP PERFORMED
Amphetamines: NOT DETECTED
Barbiturates: NOT DETECTED
Benzodiazepines: NOT DETECTED
Cocaine: NOT DETECTED
OPIATES: NOT DETECTED
Tetrahydrocannabinol: NOT DETECTED

## 2013-06-20 LAB — ACETAMINOPHEN LEVEL: Acetaminophen (Tylenol), Serum: <15 ug/mL (ref 10–30)

## 2013-06-20 LAB — ETHANOL

## 2013-06-20 LAB — SALICYLATE LEVEL

## 2013-06-20 MED ORDER — SODIUM CHLORIDE 0.9 % IV BOLUS (SEPSIS)
1000.0000 mL | Freq: Once | INTRAVENOUS | Status: AC
Start: 2013-06-20 — End: 2013-06-21
  Administered 2013-06-20: 1000 mL via INTRAVENOUS
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
## 2013-06-20 NOTE — ED Notes (Signed)
Bed: WA04 Expected date:  Expected time:  Means of arrival:  Comments: EMS- Psych Eval, delusion, non-ambulatory

## 2013-06-20 NOTE — ED Notes (Signed)
Patient is trying to urinate 
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
## 2013-06-20 NOTE — ED Notes (Signed)
Attempted IV x2 and was unsuccessful. IV team contacted.

## 2013-06-21 ENCOUNTER — Encounter (HOSPITAL_COMMUNITY): Payer: Self-pay

## 2013-06-21 DIAGNOSIS — N390 Urinary tract infection, site not specified: Secondary | ICD-10-CM | POA: Diagnosis present

## 2013-06-21 DIAGNOSIS — R4182 Altered mental status, unspecified: Secondary | ICD-10-CM

## 2013-06-21 DIAGNOSIS — G9340 Encephalopathy, unspecified: Secondary | ICD-10-CM

## 2013-06-21 DIAGNOSIS — R82998 Other abnormal findings in urine: Secondary | ICD-10-CM

## 2013-06-21 DIAGNOSIS — K5900 Constipation, unspecified: Secondary | ICD-10-CM

## 2013-06-21 DIAGNOSIS — D638 Anemia in other chronic diseases classified elsewhere: Secondary | ICD-10-CM

## 2013-06-21 DIAGNOSIS — J9600 Acute respiratory failure, unspecified whether with hypoxia or hypercapnia: Secondary | ICD-10-CM

## 2013-06-21 LAB — URINALYSIS, ROUTINE W REFLEX MICROSCOPIC
Bilirubin Urine: NEGATIVE
Glucose, UA: NEGATIVE mg/dL
Ketones, ur: NEGATIVE mg/dL
Nitrite: NEGATIVE
Protein, ur: NEGATIVE mg/dL
SPECIFIC GRAVITY, URINE: 1.012 (ref 1.005–1.030)
UROBILINOGEN UA: 1 mg/dL (ref 0.0–1.0)
pH: 7 (ref 5.0–8.0)

## 2013-06-21 LAB — BASIC METABOLIC PANEL
BUN: 27 mg/dL — ABNORMAL HIGH (ref 6–23)
BUN: 22 mg/dL (ref 6–23)
CHLORIDE: 100 meq/L (ref 96–112)
CHLORIDE: 99 meq/L (ref 96–112)
CO2: 18 meq/L — AB (ref 19–32)
CO2: 20 meq/L (ref 19–32)
Calcium: 9.1 mg/dL (ref 8.4–10.5)
Calcium: 9.2 mg/dL (ref 8.4–10.5)
Creatinine, Ser: 1.28 mg/dL — ABNORMAL HIGH (ref 0.50–1.10)
Creatinine, Ser: 0.97 mg/dL (ref 0.50–1.10)
GFR calc Af Amer: 42 mL/min — ABNORMAL LOW (ref >90)
GFR calc Af Amer: 58 mL/min — ABNORMAL LOW (ref >90)
GFR calc non Af Amer: 36 mL/min — ABNORMAL LOW (ref >90)
GFR, EST NON AFRICAN AMERICAN: 50 mL/min — AB (ref >90)
GLUCOSE: 65 mg/dL — AB (ref 70–99)
Glucose, Bld: 77 mg/dL (ref 70–99)
POTASSIUM: 5.4 meq/L — AB (ref 3.7–5.3)
POTASSIUM: 5.5 meq/L — AB (ref 3.7–5.3)
SODIUM: 132 meq/L — AB (ref 137–147)
Sodium: 133 mEq/L — ABNORMAL LOW (ref 137–147)

## 2013-06-21 LAB — COMPREHENSIVE METABOLIC PANEL
ALK PHOS: 89 U/L (ref 39–117)
ALT: 12 U/L (ref 0–35)
AST: 25 U/L (ref 0–37)
Albumin: 4.2 g/dL (ref 3.5–5.2)
BILIRUBIN TOTAL: 0.3 mg/dL (ref 0.3–1.2)
BUN: 27 mg/dL — ABNORMAL HIGH (ref 6–23)
CHLORIDE: 98 meq/L (ref 96–112)
CO2: 18 meq/L — AB (ref 19–32)
Calcium: 9.5 mg/dL (ref 8.4–10.5)
Creatinine, Ser: 1.29 mg/dL — ABNORMAL HIGH (ref 0.50–1.10)
GFR calc non Af Amer: 36 mL/min — ABNORMAL LOW (ref >90)
GFR, EST AFRICAN AMERICAN: 41 mL/min — AB (ref >90)
Glucose, Bld: 126 mg/dL — ABNORMAL HIGH (ref 70–99)
POTASSIUM: 5.1 meq/L (ref 3.7–5.3)
SODIUM: 132 meq/L — AB (ref 137–147)
Total Protein: 7.9 g/dL (ref 6.0–8.3)

## 2013-06-21 LAB — CBC
HCT: 36.5 % (ref 36.0–46.0)
Hemoglobin: 12.4 g/dL (ref 12.0–15.0)
MCH: 29.7 pg (ref 26.0–34.0)
MCHC: 34 g/dL (ref 30.0–36.0)
MCV: 87.5 fL (ref 78.0–100.0)
Platelets: 239 10*3/uL (ref 150–400)
RBC: 4.17 MIL/uL (ref 3.87–5.11)
RDW: 13.4 % (ref 11.5–15.5)
WBC: 9.4 10*3/uL (ref 4.0–10.5)

## 2013-06-21 LAB — TROPONIN I
Troponin I: <0.3 ng/mL (ref <0.30)
Troponin I: <0.3 ng/mL (ref <0.30)

## 2013-06-21 LAB — URINE MICROSCOPIC-ADD ON

## 2013-06-21 LAB — MRSA PCR SCREENING: MRSA BY PCR: NEGATIVE

## 2013-06-21 LAB — MAGNESIUM: Magnesium: 2.1 mg/dL (ref 1.5–2.5)

## 2013-06-21 LAB — OSMOLALITY, URINE: OSMOLALITY UR: 448 mosm/kg (ref 390–1090)

## 2013-06-21 LAB — SODIUM, URINE, RANDOM: Sodium, Ur: 125 mEq/L

## 2013-06-21 LAB — TSH: TSH: 0.54 u[IU]/mL (ref 0.350–4.500)

## 2013-06-21 LAB — CREATININE, URINE, RANDOM: Creatinine, Urine: 46.47 mg/dL

## 2013-06-21 LAB — PHOSPHORUS: Phosphorus: 3.7 mg/dL (ref 2.3–4.6)

## 2013-06-21 MED ORDER — FLEET ENEMA 7-19 GM/118ML RE ENEM: 1.0000 | ENEMA | Freq: Once | RECTAL | Status: AC | PRN

## 2013-06-21 MED ORDER — BISACODYL 10 MG RE SUPP: 10.0000 mg | Freq: Every day | RECTAL | Status: DC | PRN

## 2013-06-21 MED ORDER — GUAIFENESIN ER 600 MG PO TB12
600.0000 mg | ORAL_TABLET | Freq: Two times a day (BID) | ORAL | Status: DC
  Administered 2013-06-21 – 2013-06-23 (×5): 600 mg via ORAL
  Filled 2013-06-21 (×7): qty 1

## 2013-06-21 MED ORDER — POLYVINYL ALCOHOL 1.4 % OP SOLN
1.0000 [drp] | Freq: Three times a day (TID) | OPHTHALMIC | Status: DC
  Administered 2013-06-21 – 2013-06-22 (×6): 1 [drp] via OPHTHALMIC
  Filled 2013-06-21: qty 15

## 2013-06-21 MED ORDER — LORATADINE 10 MG PO TABS
10.0000 mg | ORAL_TABLET | Freq: Every day | ORAL | Status: DC
  Administered 2013-06-21 – 2013-06-23 (×3): 10 mg via ORAL
  Filled 2013-06-21 (×3): qty 1

## 2013-06-21 MED ORDER — BEPOTASTINE BESILATE 1.5 % OP SOLN: 1.0000 [drp] | Freq: Two times a day (BID) | OPHTHALMIC | Status: DC

## 2013-06-21 MED ORDER — ENOXAPARIN SODIUM 30 MG/0.3ML ~~LOC~~ SOLN
30.0000 mg | SUBCUTANEOUS | Status: DC
  Administered 2013-06-21: 30 mg via SUBCUTANEOUS
  Filled 2013-06-21: qty 0.3

## 2013-06-21 MED ORDER — CEFTRIAXONE SODIUM 1 G IJ SOLR
1.0000 g | INTRAMUSCULAR | Status: DC
  Administered 2013-06-21 – 2013-06-23 (×3): 1 g via INTRAVENOUS
  Filled 2013-06-21 (×3): qty 10

## 2013-06-21 MED ORDER — SULFAMETHOXAZOLE-TMP DS 800-160 MG PO TABS
1.0000 | ORAL_TABLET | Freq: Two times a day (BID) | ORAL | Status: DC
  Filled 2013-06-21 (×3): qty 1

## 2013-06-21 MED ORDER — ENOXAPARIN SODIUM 40 MG/0.4ML ~~LOC~~ SOLN
40.0000 mg | SUBCUTANEOUS | Status: DC
  Administered 2013-06-22: 40 mg via SUBCUTANEOUS
  Filled 2013-06-21 (×2): qty 0.4

## 2013-06-21 MED ORDER — ONDANSETRON HCL 4 MG PO TABS: 4.0000 mg | ORAL_TABLET | Freq: Four times a day (QID) | ORAL | Status: DC | PRN

## 2013-06-21 MED ORDER — ALBUTEROL SULFATE (2.5 MG/3ML) 0.083% IN NEBU: 2.5000 mg | INHALATION_SOLUTION | RESPIRATORY_TRACT | Status: DC | PRN

## 2013-06-21 MED ORDER — SODIUM CHLORIDE 1 G PO TABS
1.0000 g | ORAL_TABLET | Freq: Two times a day (BID) | ORAL | Status: DC
  Administered 2013-06-21 – 2013-06-23 (×5): 1 g via ORAL
  Filled 2013-06-21 (×7): qty 1

## 2013-06-21 MED ORDER — TERBINAFINE HCL 250 MG PO TABS
250.0000 mg | ORAL_TABLET | Freq: Every day | ORAL | Status: DC
  Administered 2013-06-21 – 2013-06-22 (×2): 250 mg via ORAL
  Filled 2013-06-21 (×4): qty 1

## 2013-06-21 MED ORDER — PROPYLENE GLYCOL 0.6 % OP SOLN: 1.0000 [drp] | Freq: Three times a day (TID) | OPHTHALMIC | Status: DC

## 2013-06-21 MED ORDER — MEMANTINE HCL ER 7 MG PO CP24
28.0000 mg | ORAL_CAPSULE | Freq: Every day | ORAL | Status: DC
  Administered 2013-06-21 – 2013-06-23 (×3): 28 mg via ORAL
  Filled 2013-06-21 (×3): qty 4

## 2013-06-21 MED ORDER — SACCHAROMYCES BOULARDII 250 MG PO CAPS
250.0000 mg | ORAL_CAPSULE | Freq: Two times a day (BID) | ORAL | Status: DC
Start: 2013-06-21 — End: 2013-06-23
  Administered 2013-06-21 – 2013-06-23 (×5): 250 mg via ORAL
  Filled 2013-06-21 (×7): qty 1

## 2013-06-21 MED ORDER — LORAZEPAM 0.5 MG PO TABS
0.5000 mg | ORAL_TABLET | Freq: Two times a day (BID) | ORAL | Status: DC | PRN
  Administered 2013-06-22: 0.5 mg via ORAL
  Filled 2013-06-21 (×3): qty 1

## 2013-06-21 MED ORDER — ASPIRIN EC 81 MG PO TBEC
81.0000 mg | DELAYED_RELEASE_TABLET | Freq: Every day | ORAL | Status: DC
  Administered 2013-06-21 – 2013-06-23 (×3): 81 mg via ORAL
  Filled 2013-06-21 (×3): qty 1

## 2013-06-21 MED ORDER — ONDANSETRON HCL 4 MG/2ML IJ SOLN: 4.0000 mg | Freq: Four times a day (QID) | INTRAMUSCULAR | Status: DC | PRN

## 2013-06-21 MED ORDER — SODIUM POLYSTYRENE SULFONATE 15 GM/60ML PO SUSP
30.0000 g | Freq: Once | ORAL | Status: AC
  Administered 2013-06-21: 30 g via ORAL
  Filled 2013-06-21: qty 120

## 2013-06-21 MED ORDER — ACETAMINOPHEN 325 MG PO TABS: 650.0000 mg | ORAL_TABLET | Freq: Four times a day (QID) | ORAL | Status: DC | PRN

## 2013-06-21 MED ORDER — DOCUSATE SODIUM 100 MG PO CAPS
100.0000 mg | ORAL_CAPSULE | Freq: Two times a day (BID) | ORAL | Status: DC
  Administered 2013-06-21 – 2013-06-23 (×5): 100 mg via ORAL
  Filled 2013-06-21 (×7): qty 1

## 2013-06-21 MED ORDER — LORAZEPAM 2 MG/ML IJ SOLN
1.0000 mg | Freq: Once | INTRAMUSCULAR | Status: AC
  Administered 2013-06-21: 1 mg via INTRAVENOUS
  Filled 2013-06-21: qty 1

## 2013-06-21 MED ORDER — SODIUM CHLORIDE 0.9 % IV SOLN
INTRAVENOUS | Status: AC
  Administered 2013-06-21: 03:00:00 via INTRAVENOUS

## 2013-06-21 MED ORDER — POLYETHYLENE GLYCOL 3350 17 G PO PACK
17.0000 g | PACK | ORAL | Status: DC
  Administered 2013-06-21 – 2013-06-23 (×2): 17 g via ORAL
  Filled 2013-06-21 (×2): qty 1

## 2013-06-21 MED ORDER — ACETAMINOPHEN 650 MG RE SUPP: 650.0000 mg | Freq: Four times a day (QID) | RECTAL | Status: DC | PRN

## 2013-06-21 MED ORDER — SODIUM CHLORIDE 0.9 % IJ SOLN
3.0000 mL | Freq: Two times a day (BID) | INTRAMUSCULAR | Status: DC
  Administered 2013-06-21: 3 mL via INTRAVENOUS

## 2013-06-21 NOTE — Progress Notes (Signed)
CARE MANAGEMENT NOTE 06/21/2013  Patient:  DELOMA, WACTOR   Account Number:  [id number]  Date Initiated:  06/21/2013  Documentation initiated by:  Willman Cuny,COOKIE  Subjective/Objective Assessment:   pt admitted with hyponatremia, UTI, Confusion     Action/Plan:   from [facility]   Anticipated DC Date:  06/23/2013   Anticipated DC Plan:  SKILLED NURSING FACILITY  In-house referral  Clinical Social Worker      DC Planning Services  CM consult      Choice offered to / List presented to:             Status of service:   Medicare Important Message given?  NA - LOS <3 / Initial given by admissions (If response is "NO", the following Medicare IM given date fields will be blank) Date Medicare IM given:   Date Additional Medicare IM given:    Discharge Disposition:    Per UR Regulation:  Reviewed for med. necessity/level of care/duration of stay  If discussed at Long Length of Stay Meetings, dates discussed:    Comments:  06/21/13 MMcGibboney, RN, BSN Chart reviewed. Pt from [facility].

## 2013-06-21 NOTE — Progress Notes (Signed)
Clinical Social Work Department BRIEF PSYCHOSOCIAL ASSESSMENT 06/21/2013  Patient:  SEDA, TISCHNER     Account Number:  [id number]     Admit date:  06/20/2013  Clinical Social Worker:  Orpah Greek  Date/Time:  06/21/2013 04:29 PM  Referred by:  Physician  Date Referred:  06/21/2013 Referred for  Other - See comment   Other Referral:   Admitted from: [facility]   Interview type:  Family Other interview type:   patient's niece, Flossie    PSYCHOSOCIAL DATA Living Status:  FACILITY Admitted from facility:  [facility] Level of care:  Skilled Nursing Facility Primary support name:  [name] (niece) h#: [phone] c#: [phone] Primary support relationship to patient:  FAMILY Degree of support available:   good    CURRENT CONCERNS Current Concerns  Post-Acute Placement   Other Concerns:    SOCIAL WORK ASSESSMENT / PLAN CSW received consult that patient was admitted from [facility].   Assessment/plan status:  Information/Referral to [facility] Other assessment/ plan:   Information/referral to community resources:   CSW completed FL2 and faxed information to [facility], confirmed with Tammy that they would be able to take patient back when stable.    PATIENT'S/FAMILY'S RESPONSE TO PLAN OF CARE: Patient's niece states that she has been pleased with the care that she has been getting at [facility] & would prefer that she return there when ready.       Lincoln Maxin, LCSW [facility] Clinical Social Worker cell #: [phone]

## 2013-06-21 NOTE — Progress Notes (Signed)
Patient ID: [name]  female  IEP:[id number]    DOB: 1923/01/12    DOA: 06/20/2013  PCP: Bufford Spikes, DO  Assessment/Plan: Principal Problem:   Acute encephalopathy: At baseline has dementia with psychosis, mental status worse, recently was found to have UTI, on Bactrim PTA also worsening hyponatremia -Discontinue Bactrim, placed on IV Rocephin, follow cultures   Active Problems:   HYPERTENSION - Stable    Dementia with behavioral disturbance, FTT (failure to thrive) in adult - PTOT evaluation once mental status is improved    Hyponatremia with acute renal insufficiency - Creatinine 1.6 at the time of admission, now improving with IV fluid hydration, sodium improving    UTI (urinary tract infection) - Follow urine culture and sensitivities, placed on IV Rocephin  DVT Prophylaxis:  Code Status:  Family Communication:  Disposition:Skilled nursing facility  Consultants:  None  Procedures:  None  Antibiotics:  IV Rocephin    Subjective: Somnolent, no acute issues overnight  Objective: Weight change:  No intake or output data in the 24 hours ending 06/21/13 1515 Blood pressure 148/76, pulse 83, temperature 97.8 F (36.6 C), temperature source Oral, resp. rate 20, height 5\' 3"  (1.6 m), weight 81.647 kg (180 lb), SpO2 100.00%.  Physical Exam: General:  NAD, somnolent but arousable  CVS: S1-S2 clear, no murmur rubs or gallops Chest: clear to auscultation bilaterally, no wheezing, rales or rhonchi Abdomen:  obese soft nontender, nondistended, normal bowel sounds  Extremities: no cyanosis, clubbing or edema noted bilaterally   Lab Results: Basic Metabolic Panel:  Recent Labs Lab 06/21/13 0215 06/21/13 1005  NA 132* 133*  K 5.1 5.4*  CL 98 100  CO2 18* 18*  GLUCOSE 126* 65*  BUN 27* 27*  CREATININE 1.29* 1.28*  CALCIUM 9.5 9.1  MG 2.1  --   PHOS 3.7  --    Liver Function Tests:  Recent Labs Lab 06/20/13 2005 06/21/13 0215  AST 26 25    ALT 12 12  ALKPHOS 93 89  BILITOT 0.2* 0.3  PROT 8.2 7.9  ALBUMIN 4.4 4.2   No results found for this basename: LIPASE, AMYLASE,  in the last 168 hours No results found for this basename: AMMONIA,  in the last 168 hours CBC:  Recent Labs Lab 06/20/13 2005 06/21/13 0215  WBC 7.9 9.4  HGB 12.4 12.4  HCT 36.3 36.5  MCV 86.0 87.5  PLT 269 239   Cardiac Enzymes:  Recent Labs Lab 06/21/13 0215 06/21/13 0743 06/21/13 1348  TROPONINI <0.30 <0.30 <0.30   BNP: No components found with this basename: POCBNP,  CBG: No results found for this basename: GLUCAP,  in the last 168 hours   Micro Results: Recent Results (from the past 240 hour(s))  MRSA PCR SCREENING     Status: None   Collection Time    06/21/13  3:31 AM      Result Value Ref Range Status   MRSA by PCR NEGATIVE  NEGATIVE Final   Comment:            The GeneXpert MRSA Assay (FDA     approved for NASAL specimens     only), is one component of a     comprehensive MRSA colonization     surveillance program. It is not     intended to diagnose MRSA     infection nor to guide or     monitor treatment for     MRSA infections.    Studies/Results: Ct Head Wo  Contrast  06/20/2013   CLINICAL DATA:  Altered mental status.  EXAM: CT HEAD WITHOUT CONTRAST  TECHNIQUE: Contiguous axial images were obtained from the base of the skull through the vertex without intravenous contrast.  COMPARISON:  Head CT scan 06/09/2012.  FINDINGS: Marked cortical atrophy and extensive chronic microvascular ischemic change are again seen. There is no evidence of acute intracranial abnormality including infarct, hemorrhage, mass lesion, mass effect, midline shift or abnormal extra-axial fluid collection. No hydrocephalus or pneumocephalus.  IMPRESSION: No acute finding.  Marked atrophy and extensive chronic microvascular ischemic change.   Electronically Signed   By: Drusilla Kannerhomas  Dalessio M.D.   On: 06/20/2013 21:38   Dg Chest Portable 1  View  06/20/2013   CLINICAL DATA:  Altered mental status.  EXAM: PORTABLE CHEST - 1 VIEW  COMPARISON:  03/01/2013  FINDINGS: Unchanged appearance of 2 lead cardiac pacemaker. The heart size and mediastinal contours are within normal limits. Both lungs are clear. The visualized skeletal structures are unremarkable. Degenerative changes in the shoulders and spine. Calcification of the aorta.  IMPRESSION: No active disease.   Electronically Signed   By: Burman NievesWilliam  Stevens M.D.   On: 06/20/2013 22:51    Medications: Scheduled Meds: . aspirin EC  81 mg Oral Daily  . Bepotastine Besilate  1 drop Both Eyes BID  . cefTRIAXone (ROCEPHIN)  IV  1 g Intravenous Q24H  . docusate sodium  100 mg Oral BID  . enoxaparin (LOVENOX) injection  30 mg Subcutaneous Q24H  . guaiFENesin  600 mg Oral BID  . loratadine  10 mg Oral Daily  . Memantine HCl ER  28 mg Oral Daily  . polyethylene glycol  17 g Oral QODAY  . polyvinyl alcohol  1 drop Both Eyes TID  . saccharomyces boulardii  250 mg Oral BID  . sodium chloride  3 mL Intravenous Q12H  . sodium chloride  1 g Oral BID  . terbinafine  250 mg Oral Daily      LOS: 1 day   Makia Bossi Jenna LuoK Jayson Waterhouse M.D. [facility] 06/21/2013, 3:15 PM Pager: [phone]  If 7PM-7AM, please contact night-coverage [email or web address] Password [email or web address]  **Disclaimer: This note was dictated with voice recognition software. Similar sounding words can inadvertently be transcribed and this note may contain transcription errors which may not have been corrected upon publication of note.**

## 2013-06-22 MED ORDER — LORAZEPAM 2 MG/ML IJ SOLN: 1.0000 mg | Freq: Once | INTRAMUSCULAR | Status: DC

## 2013-06-22 NOTE — Clinical Documentation Improvement (Signed)
PLEASE CLARIFY TYPE AND ACUITY OR CHF:  Possible Clinical Conditions? Chronic Systolic Congestive Heart Failure Chronic Diastolic Congestive Heart Failure Chronic Systolic & Diastolic Congestive Heart Failure Acute Systolic Congestive Heart Failure Acute Diastolic Congestive Heart Failure Acute Systolic & Diastolic Congestive Heart Failure Acute on Chronic Systolic Congestive Heart Failure Acute on Chronic Diastolic Congestive Heart Failure Acute on Chronic Systolic & Diastolic Congestive Heart Failure Other Condition Cannot Clinically Determine  Supporting Information:(As per notes)   Past Medical History:  CHF (congestive heart failure)   Thank You, Nevin Bloodgood, RN, BSN, CCDS, Clinical Documentation Specialist:  [phone]   [phone]=[facility] [place]- Health Information Management

## 2013-06-22 NOTE — Progress Notes (Signed)
Assumed care of patient at 1530, received report from [place], [place]. Upon reassessment of patient, patient is oriented to person, place, and situation. Disoriented to time. Patient with no complaints at this time. Will continue to monitor. J.Jaylen Claude, RN

## 2013-06-22 NOTE — Progress Notes (Signed)
Patient ID: Ashley LouisGertrude K Patterson  female  ZOX:[id number]    DOB: 02/19/1922    DOA: 06/20/2013  PCP: Bufford SpikesEED, TIFFANY, DO  Assessment/Plan: Principal Problem:   Acute encephalopathy: At baseline has dementia with psychosis, mental status worse - likely due to UTI and hyponatremia    Active Problems:    UTI (urinary tract infection) -Discontinue Bactrim - Follow urine culture and sensitivities, placed on IV Rocephin- treat for 3 days- cultures pending    Hyponatremia with acute renal insufficiency - Creatinine 1.6 at the time of admission, now improving with IV fluid hydration, sodium improving    HYPERTENSION - Stable    Dementia with behavioral disturbance, FTT (failure to thrive) in adult   DVT Prophylaxis:Lovenox  Code Status:Full code  Family Communication: none  Disposition:Skilled nursing facility  Consultants:  None  Procedures:  None  Antibiotics:  IV Rocephin   Subjective: Alert, aware that she is in the hospital but not sure why  Objective: Weight change:   Intake/Output Summary (Last 24 hours) at 06/22/13 1315 Last data filed at 06/22/13 0900  Gross per 24 hour  Intake    330 ml  Output      0 ml  Net    330 ml   Blood pressure 160/75, pulse 62, temperature 97.5 F (36.4 C), temperature source Oral, resp. rate 16, height 5\' 3"  (1.6 m), weight 81.647 kg (180 lb), SpO2 99.00%.  Physical Exam: General:  NAD, alert CVS: S1-S2 clear, no murmur rubs or gallops Chest: clear to auscultation bilaterally, no wheezing, rales or rhonchi Abdomen:  obese soft nontender, nondistended, normal bowel sounds  Extremities: no cyanosis, clubbing or edema noted bilaterally   Lab Results: Basic Metabolic Panel:  Recent Labs Lab 06/21/13 0215 06/21/13 1005 06/21/13 1755  NA 132* 133* 132*  K 5.1 5.4* 5.5*  CL 98 100 99  CO2 18* 18* 20  GLUCOSE 126* 65* 77  BUN 27* 27* 22  CREATININE 1.29* 1.28* 0.97  CALCIUM 9.5 9.1 9.2  MG 2.1  --   --   PHOS 3.7  --    --    Liver Function Tests:  Recent Labs Lab 06/20/13 2005 06/21/13 0215  AST 26 25  ALT 12 12  ALKPHOS 93 89  BILITOT 0.2* 0.3  PROT 8.2 7.9  ALBUMIN 4.4 4.2   No results found for this basename: LIPASE, AMYLASE,  in the last 168 hours No results found for this basename: AMMONIA,  in the last 168 hours CBC:  Recent Labs Lab 06/20/13 2005 06/21/13 0215  WBC 7.9 9.4  HGB 12.4 12.4  HCT 36.3 36.5  MCV 86.0 87.5  PLT 269 239   Cardiac Enzymes:  Recent Labs Lab 06/21/13 0215 06/21/13 0743 06/21/13 1348  TROPONINI <0.30 <0.30 <0.30   BNP: No components found with this basename: POCBNP,  CBG: No results found for this basename: GLUCAP,  in the last 168 hours   Micro Results: Recent Results (from the past 240 hour(s))  URINE CULTURE     Status: None   Collection Time    06/20/13  9:31 PM      Result Value Ref Range Status   Specimen Description URINE, CATHETERIZED   Final   Special Requests NONE   Final   Culture  Setup Time     Final   Value: 06/21/2013 04:05     Performed at [facility]   Colony Count PENDING   Incomplete   Culture  Final   Value: Culture reincubated for better growth     Performed at [facility]   Report Status PENDING   Incomplete  MRSA PCR SCREENING     Status: None   Collection Time    06/21/13  3:31 AM      Result Value Ref Range Status   MRSA by PCR NEGATIVE  NEGATIVE Final   Comment:            The GeneXpert MRSA Assay (FDA     approved for NASAL specimens     only), is one component of a     comprehensive MRSA colonization     surveillance program. It is not     intended to diagnose MRSA     infection nor to guide or     monitor treatment for     MRSA infections.    Studies/Results: Ct Head Wo Contrast  06/20/2013   CLINICAL DATA:  Altered mental status.  EXAM: CT HEAD WITHOUT CONTRAST  TECHNIQUE: Contiguous axial images were obtained from the base of the skull through the vertex without  intravenous contrast.  COMPARISON:  Head CT scan 06/09/2012.  FINDINGS: Marked cortical atrophy and extensive chronic microvascular ischemic change are again seen. There is no evidence of acute intracranial abnormality including infarct, hemorrhage, mass lesion, mass effect, midline shift or abnormal extra-axial fluid collection. No hydrocephalus or pneumocephalus.  IMPRESSION: No acute finding.  Marked atrophy and extensive chronic microvascular ischemic change.   Electronically Signed   By: Drusilla Kanner M.D.   On: 06/20/2013 21:38   Dg Chest Portable 1 View  06/20/2013   CLINICAL DATA:  Altered mental status.  EXAM: PORTABLE CHEST - 1 VIEW  COMPARISON:  03/01/2013  FINDINGS: Unchanged appearance of 2 lead cardiac pacemaker. The heart size and mediastinal contours are within normal limits. Both lungs are clear. The visualized skeletal structures are unremarkable. Degenerative changes in the shoulders and spine. Calcification of the aorta.  IMPRESSION: No active disease.   Electronically Signed   By: Burman Nieves M.D.   On: 06/20/2013 22:51    Medications: Scheduled Meds: . aspirin EC  81 mg Oral Daily  . Bepotastine Besilate  1 drop Both Eyes BID  . cefTRIAXone (ROCEPHIN)  IV  1 g Intravenous Q24H  . docusate sodium  100 mg Oral BID  . enoxaparin (LOVENOX) injection  40 mg Subcutaneous Q24H  . guaiFENesin  600 mg Oral BID  . loratadine  10 mg Oral Daily  . Memantine HCl ER  28 mg Oral Daily  . polyethylene glycol  17 g Oral QODAY  . polyvinyl alcohol  1 drop Both Eyes TID  . saccharomyces boulardii  250 mg Oral BID  . sodium chloride  3 mL Intravenous Q12H  . sodium chloride  1 g Oral BID  . terbinafine  250 mg Oral Daily      LOS: 2 days   [facility] M.D. [facility] 06/22/2013, 1:15 PM Pager: [phone]  If 7PM-7AM, please contact night-coverage [email or web address] Password [email or web address]

## 2013-06-23 DIAGNOSIS — N390 Urinary tract infection, site not specified: Principal | ICD-10-CM

## 2013-06-23 LAB — URINE CULTURE: Colony Count: 70000

## 2013-06-23 MED ORDER — SODIUM POLYSTYRENE SULFONATE 15 GM/60ML PO SUSP
30.0000 g | Freq: Once | ORAL | Status: AC
  Administered 2013-06-23: 30 g via ORAL
  Filled 2013-06-23: qty 120

## 2013-06-23 MED ORDER — LORAZEPAM 2 MG/ML IJ SOLN
1.0000 mg | Freq: Once | INTRAMUSCULAR | Status: AC
Start: 2013-06-23 — End: 2013-06-23
  Administered 2013-06-23: 1 mg via INTRAVENOUS
  Filled 2013-06-23: qty 1

## 2013-06-23 MED ORDER — BISACODYL 10 MG RE SUPP: 10.0000 mg | Freq: Every day | RECTAL | Status: DC | PRN

## 2013-06-23 NOTE — Progress Notes (Signed)
Received report from Junior, Charity fundraiser. Agree with previous assessment. Will continue to monitor. Fraser Din, RN

## 2013-06-23 NOTE — Progress Notes (Addendum)
Report given to [place] from [facility]. Glyndon Tursi B RN. Reported to Shquenia Kayexalate was given as order by MD  Potassium level 5.5

## 2013-06-23 NOTE — Progress Notes (Signed)
Patient is set to discharge back to [facility] today. Patient & daughter, Geraldine Contras aware. Discharge packet given to RN, Myrna. PTAR scheduled for 3:00pm pickup (Service Request Id: [id number]).   Lincoln Maxin, LCSW [facility] Clinical Social Worker cell #: [phone]

## 2013-06-23 NOTE — Progress Notes (Signed)
D/C via EMS all paper work given.
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
## 2013-06-24 NOTE — Progress Notes (Signed)
Utilization review completed.  

## 2013-07-05 ENCOUNTER — Encounter: Payer: Self-pay | Admitting: Internal Medicine

## 2013-07-05 ENCOUNTER — Non-Acute Institutional Stay (SKILLED_NURSING_FACILITY): Payer: Medicare Other | Admitting: Internal Medicine

## 2013-07-05 DIAGNOSIS — E871 Hypo-osmolality and hyponatremia: Secondary | ICD-10-CM

## 2013-07-05 DIAGNOSIS — I10 Essential (primary) hypertension: Secondary | ICD-10-CM

## 2013-07-05 DIAGNOSIS — G9340 Encephalopathy, unspecified: Secondary | ICD-10-CM

## 2013-07-05 DIAGNOSIS — F03918 Unspecified dementia, unspecified severity, with other behavioral disturbance: Secondary | ICD-10-CM

## 2013-07-05 DIAGNOSIS — N390 Urinary tract infection, site not specified: Secondary | ICD-10-CM

## 2013-07-05 DIAGNOSIS — E875 Hyperkalemia: Secondary | ICD-10-CM

## 2013-07-05 DIAGNOSIS — F0391 Unspecified dementia with behavioral disturbance: Secondary | ICD-10-CM

## 2013-07-05 NOTE — Assessment & Plan Note (Signed)
-   Creatinine 1.6 at the time of admission improved to 0.97 now with hydration  - Sodium unfortunately has improved from 128 to 132- cont salt tables and cont to follow as outpt

## 2013-07-05 NOTE — Progress Notes (Signed)
MRN: [id number] Name: [name]  Sex: female Age: 80 y.o. DOB: December 03, 1922  PSC #: Ronni Rumble Facility/Room: 222B Level Of Care: SNF Provider: Merrilee Seashore D Emergency Contacts: Extended Emergency Contact Information Primary Emergency Contact: Jackson,Flossie Address: [address]          [place], [place] Home Phone: [phone] Mobile Phone: [phone] Relation: Niece Secondary Emergency Contact: Gwynn,John&Karen  [place] Home Phone: [phone] Relation: Nephew  Code Status: DNR  Allergies: Amlodipine; Hydrocodone; and Penicillins  Chief Complaint  Patient presents with  . nursing home admission    HPI: Patient is 80 y.o. female who was sent to ED for behavoirs and found to have hyponatremia and a UTI which was already being treated at SNFSince return to SNF, pt has a brand new UTI with tx started 6/22.  Past Medical History  Diagnosis Date  . Hypertension   . Hyperlipidemia   . Cardiomyopathy   . Pacemaker     [employer] [id number]  . PVD (peripheral vascular disease) 2007    stenting b legs 2007,   . Allergic rhinitis   . GERD (gastroesophageal reflux disease)   . Osteoporosis   . Osteoarthritis   . Gallstones     per [place] 11-2008, also no AAA  . CHF (congestive heart failure)   . Pacemaker   . GERD (gastroesophageal reflux disease)   . 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 Dementia     Past Surgical History  Procedure Laterality Date  . Pacemaker insertion      permanent   . Breast biopsy benign        Medication List       This list is accurate as of: 07/05/13  2:27 PM.  Always use your most recent med list.               acetaminophen 325 MG tablet  Commonly known as:  TYLENOL  Take 650 mg by mouth every 12 (twelve) hours.     acetaminophen 325 MG tablet  Commonly known as:  TYLENOL  Take 650 mg by mouth every 6 (six) hours as needed for  mild pain or fever.     aspirin EC 81 MG tablet  Take 81 mg by mouth daily.     BEPREVE OP  Place 1 drop into both eyes 2 (two) times daily.     BIOFREEZE 4 % Gel  Generic drug:  Menthol (Topical Analgesic)  Apply 1 application topically every 12 (twelve) hours as needed (for osteoarthrosis). For pain.     bisacodyl 10 MG suppository  Commonly known as:  DULCOLAX  Place 1 suppository (10 mg total) rectally daily as needed for moderate constipation.     calcium-vitamin D 500-200 MG-UNIT per tablet  Commonly known as:  OSCAL WITH D  Take 1 tablet by mouth daily.     cyanocobalamin 500 MCG tablet  Take 1,000 mcg by mouth daily.     diclofenac sodium 1 % Gel  Commonly known as:  VOLTAREN  Apply 4 g topically 4 (four) times daily. Bilateral knees     fexofenadine 180 MG tablet  Commonly known as:  ALLEGRA  Take 180 mg by mouth daily.     LORazepam 0.5 MG tablet  Commonly known as:  ATIVAN  Take 0.5 mg by mouth every 12 (twelve) hours as needed (aggitation).     NAMENDA XR 28 MG Cp24  Generic drug:  Memantine HCl ER  Take 28 mg by mouth daily.     OVER THE COUNTER MEDICATION  Take 1 scoop by mouth daily. Protein powder     polyethylene glycol packet  Commonly known as:  MIRALAX / GLYCOLAX  Take 17 g by mouth every other day.     promethazine 25 MG tablet  Commonly known as:  PHENERGAN  Take 25 mg by mouth every 6 (six) hours as needed for nausea or vomiting.     Propylene Glycol 0.6 % Soln  Place 1 drop into both eyes 3 (three) times daily.     saccharomyces boulardii 250 MG capsule  Commonly known as:  FLORASTOR  Take 250 mg by mouth 2 (two) times daily.     sodium chloride 1 G tablet  Take 1 g by mouth 2 (two) times daily.        No orders of the defined types were placed in this encounter.    Immunization History  Administered Date(s) Administered  . Influenza Whole 11/05/2009, 10/25/2012  . Pneumococcal Polysaccharide-23 08/24/2000, 03/03/2008  .  Pneumococcal-Unspecified 06/21/2012  . Td 06/17/2001    History  Substance Use Topics  . Smoking status: Former Games developermoker  . Smokeless tobacco: Never Used  . Alcohol Use: No    Family history is noncontributory    Review of Systems - from nurse; no sx other than MS change which is slightly better today.    Filed Vitals:   07/05/13 1407  BP: 138/89  Pulse: 74  Temp: 97.2 F (36.2 C)  Resp: 18    Physical Exam  GENERAL APPEARANCE: Alert, conversant. Appropriately groomed. No acute distress.  SKIN: No diaphoresis rash HEAD: Normocephalic, atraumatic  EYES: Conjunctiva/lids clear. Pupils round, reactive. EOMs intact.  EARS: External exam WNL, canals clear. Hearing grossly normal.  NOSE: No deformity or discharge.  MOUTH/THROAT: Lips w/o lesions RESPIRATORY: Breathing is even, unlabored. Lung sounds are clear   CARDIOVASCULAR: Heart RRR no murmurs, rubs or gallops. No peripheral edema.   GASTROINTESTINAL: Abdomen is soft, non-tender, not distended w/ normal bowel sounds. GENITOURINARY: Bladder non tender, not distended  MUSCULOSKELETAL: No abnormal joints or musculature NEUROLOGIC: Oriented X 1. Cranial nerves 2-12 grossly intact PSYCHIATRIC: Mood and affect appropriate to situation, no behavioral issues  Patient Active Problem List   Diagnosis Date Noted  . Hyperkalemia 07/05/2013  . Acute encephalopathy 06/21/2013  . UTI (urinary tract infection) 06/21/2013  . Onychomycosis due to dermatophyte 04/20/2013  . Chest pain 03/01/2013  . Anemia of chronic disease 02/22/2013  . Psychosis 08/20/2012  . Hyponatremia 07/15/2012  . Mouth ulcers 06/15/2012  . Dehydration 06/14/2012  . FTT (failure to thrive) in adult 06/14/2012  . Abnormal urinalysis 06/14/2012  . Agitated 06/14/2012  . Palliative care encounter 06/14/2012  . Hypotension 06/13/2012  . Acute respiratory failure with hypoxia 06/13/2012  . Altered mental state 06/09/2012  . Dementia with behavioral  disturbance 04/28/2012  . Constipation 08/26/2010  . Weight loss 08/26/2010  . General medical examination 05/16/2010  . PRURITUS 03/14/2010  . OTHER B-COMPLEX DEFICIENCIES 09/19/2009  . CHOLELITHIASIS 12/13/2008  . AV BLOCK, COMPLETE 05/03/2008  . BRADYCARDIA 05/03/2008  . OSTEOARTHRITIS 10/18/2007  . OSTEOPOROSIS 06/15/2007  . DIZZINESS 03/05/2007  . HYPERLIPIDEMIA 12/18/2006  . History of cardiomyopathy 06/17/2006  . PVD 06/17/2006  . HYPERTENSION 06/12/2006  . ALLERGIC RHINITIS 06/12/2006  . GERD 06/12/2006  . PACEMAKER-[employer] 06/12/2006    CBC    Component Value Date/Time   WBC 9.4 06/21/2013 0215  RBC 4.17 06/21/2013 0215   HGB 12.4 06/21/2013 0215   HCT 36.5 06/21/2013 0215   PLT 239 06/21/2013 0215   MCV 87.5 06/21/2013 0215   LYMPHSABS 2.7 03/01/2013 1650   MONOABS 0.6 03/01/2013 1650   EOSABS 0.6 03/01/2013 1650   BASOSABS 0.0 03/01/2013 1650    CMP     Component Value Date/Time   NA 132* 06/21/2013 1755   K 5.5* 06/21/2013 1755   CL 99 06/21/2013 1755   CO2 20 06/21/2013 1755   GLUCOSE 77 06/21/2013 1755   GLUCOSE 83 08/31/2009 1451   BUN 22 06/21/2013 1755   CREATININE 0.97 06/21/2013 1755   CALCIUM 9.2 06/21/2013 1755   PROT 7.9 06/21/2013 0215   ALBUMIN 4.2 06/21/2013 0215   AST 25 06/21/2013 0215   ALT 12 06/21/2013 0215   ALKPHOS 89 06/21/2013 0215   BILITOT 0.3 06/21/2013 0215   GFRNONAA 50* 06/21/2013 1755   GFRAA 58* 06/21/2013 1755    Assessment and Plan  Acute encephalopathy At baseline has dementia with psychosis- mental status worsened and apparently was agitated and verbally abusing staff  - currently still quite confused and does not make much sense when she talks but no longer agitated or restless  - possibly due to UTI and hyponatremia- per history, she becomes this way when she has a UTI   UTI (urinary tract infection) Discontinued Bactrim  - placed on IV Rocephin- treated for 3 days- cultures reveal E coli sensitive to Rocephin (also was sensitive to  Bactrim) therefore, has been adequately treated      Hyponatremia - Creatinine 1.6 at the time of admission improved to 0.97 now with hydration  - Sodium unfortunately has improved from 128 to 132- cont salt tables and cont to follow as outpt   HYPERTENSION Stable on no meds; ACE being held for inc K+  Hyperkalemia - appears chronic- despite hydration it has gone up to 5.5- will give kayexalate today- recheck K tomorrow  - ACE I has been on hold   Dementia with behavioral disturbance On namenda and ativan;hospitalist felt she might need hospice care-palllative saw her in hospital   NEW UTI- dx 6/20 2/2 MS changed - >100,000 ESBL sensitive to cipro 500 mg q 12 for 7 days  Margit Hanks, MD

## 2013-07-05 NOTE — Assessment & Plan Note (Addendum)
Discontinued Bactrim  - placed on IV Rocephin- treated for 3 days- cultures reveal E coli sensitive to Rocephin (also was sensitive to Bactrim) therefore, has been adequately treated

## 2013-07-05 NOTE — Assessment & Plan Note (Signed)
On namenda and ativan;hospitalist felt she might need hospice care-palllative saw her in hospital

## 2013-07-05 NOTE — Assessment & Plan Note (Signed)
At baseline has dementia with psychosis- mental status worsened and apparently was agitated and verbally abusing staff  - currently still quite confused and does not make much sense when she talks but no longer agitated or restless  - possibly due to UTI and hyponatremia- per history, she becomes this way when she has a UTI

## 2013-07-05 NOTE — Assessment & Plan Note (Signed)
Stable on no meds; ACE being held for inc K+

## 2013-07-05 NOTE — Assessment & Plan Note (Signed)
-   appears chronic- despite hydration it has gone up to 5.5- will give kayexalate today- recheck K tomorrow  - ACE I has been on hold

## 2013-07-12 ENCOUNTER — Encounter (HOSPITAL_COMMUNITY): Payer: Self-pay | Admitting: Emergency Medicine

## 2013-07-12 ENCOUNTER — Inpatient Hospital Stay (HOSPITAL_COMMUNITY)
Admission: EM | Admit: 2013-07-12 | Discharge: 2013-07-15 | DRG: 682 | Disposition: A | Discharge status: Skilled Nursing Facility | Payer: Medicare Other | Attending: Internal Medicine | Admitting: Internal Medicine

## 2013-07-12 ENCOUNTER — Emergency Department (HOSPITAL_COMMUNITY): Payer: Medicare Other

## 2013-07-12 ENCOUNTER — Inpatient Hospital Stay (HOSPITAL_COMMUNITY): Payer: Medicare Other

## 2013-07-12 DIAGNOSIS — R3989 Other symptoms and signs involving the genitourinary system: Secondary | ICD-10-CM | POA: Diagnosis present

## 2013-07-12 DIAGNOSIS — I129 Hypertensive chronic kidney disease with stage 1 through stage 4 chronic kidney disease, or unspecified chronic kidney disease: Secondary | ICD-10-CM | POA: Diagnosis present

## 2013-07-12 DIAGNOSIS — N179 Acute kidney failure, unspecified: Secondary | ICD-10-CM | POA: Diagnosis present

## 2013-07-12 DIAGNOSIS — R627 Adult failure to thrive: Secondary | ICD-10-CM

## 2013-07-12 DIAGNOSIS — Z9181 History of falling: Secondary | ICD-10-CM | POA: Diagnosis not present

## 2013-07-12 DIAGNOSIS — I10 Essential (primary) hypertension: Secondary | ICD-10-CM | POA: Diagnosis present

## 2013-07-12 DIAGNOSIS — K219 Gastro-esophageal reflux disease without esophagitis: Secondary | ICD-10-CM | POA: Diagnosis present

## 2013-07-12 DIAGNOSIS — F03918 Unspecified dementia, unspecified severity, with other behavioral disturbance: Secondary | ICD-10-CM

## 2013-07-12 DIAGNOSIS — I472 Ventricular tachycardia, unspecified: Secondary | ICD-10-CM | POA: Diagnosis present

## 2013-07-12 DIAGNOSIS — I248 Other forms of acute ischemic heart disease: Secondary | ICD-10-CM | POA: Diagnosis present

## 2013-07-12 DIAGNOSIS — M810 Age-related osteoporosis without current pathological fracture: Secondary | ICD-10-CM | POA: Diagnosis present

## 2013-07-12 DIAGNOSIS — I428 Other cardiomyopathies: Secondary | ICD-10-CM | POA: Diagnosis present

## 2013-07-12 DIAGNOSIS — E870 Hyperosmolality and hypernatremia: Secondary | ICD-10-CM | POA: Diagnosis present

## 2013-07-12 DIAGNOSIS — B370 Candidal stomatitis: Secondary | ICD-10-CM | POA: Clinically undetermined

## 2013-07-12 DIAGNOSIS — E860 Dehydration: Secondary | ICD-10-CM | POA: Diagnosis present

## 2013-07-12 DIAGNOSIS — I442 Atrioventricular block, complete: Secondary | ICD-10-CM

## 2013-07-12 DIAGNOSIS — R778 Other specified abnormalities of plasma proteins: Secondary | ICD-10-CM | POA: Diagnosis present

## 2013-07-12 DIAGNOSIS — Z8679 Personal history of other diseases of the circulatory system: Secondary | ICD-10-CM

## 2013-07-12 DIAGNOSIS — F0391 Unspecified dementia with behavioral disturbance: Secondary | ICD-10-CM

## 2013-07-12 DIAGNOSIS — I4729 Other ventricular tachycardia: Secondary | ICD-10-CM | POA: Diagnosis present

## 2013-07-12 DIAGNOSIS — Z87891 Personal history of nicotine dependence: Secondary | ICD-10-CM | POA: Diagnosis not present

## 2013-07-12 DIAGNOSIS — Z66 Do not resuscitate: Secondary | ICD-10-CM | POA: Diagnosis present

## 2013-07-12 DIAGNOSIS — Z950 Presence of cardiac pacemaker: Secondary | ICD-10-CM | POA: Diagnosis not present

## 2013-07-12 DIAGNOSIS — N189 Chronic kidney disease, unspecified: Secondary | ICD-10-CM

## 2013-07-12 DIAGNOSIS — D638 Anemia in other chronic diseases classified elsewhere: Secondary | ICD-10-CM | POA: Diagnosis present

## 2013-07-12 DIAGNOSIS — G9340 Encephalopathy, unspecified: Secondary | ICD-10-CM

## 2013-07-12 DIAGNOSIS — I2489 Other forms of acute ischemic heart disease: Secondary | ICD-10-CM | POA: Diagnosis present

## 2013-07-12 DIAGNOSIS — R1310 Dysphagia, unspecified: Secondary | ICD-10-CM | POA: Diagnosis present

## 2013-07-12 DIAGNOSIS — R799 Abnormal finding of blood chemistry, unspecified: Secondary | ICD-10-CM | POA: Diagnosis present

## 2013-07-12 DIAGNOSIS — E785 Hyperlipidemia, unspecified: Secondary | ICD-10-CM | POA: Diagnosis present

## 2013-07-12 DIAGNOSIS — R7989 Other specified abnormal findings of blood chemistry: Secondary | ICD-10-CM

## 2013-07-12 HISTORY — DX: Adult failure to thrive: R62.7

## 2013-07-12 HISTORY — DX: Atrioventricular block, complete: I44.2

## 2013-07-12 HISTORY — DX: Anemia in other chronic diseases classified elsewhere: D63.8

## 2013-07-12 HISTORY — DX: Hypo-osmolality and hyponatremia: E87.1

## 2013-07-12 HISTORY — DX: Repeated falls: R29.6

## 2013-07-12 HISTORY — DX: Hyperkalemia: E87.5

## 2013-07-12 LAB — URINALYSIS, ROUTINE W REFLEX MICROSCOPIC
GLUCOSE, UA: NEGATIVE mg/dL
HGB URINE DIPSTICK: NEGATIVE
KETONES UR: NEGATIVE mg/dL
Nitrite: NEGATIVE
PH: 5 (ref 5.0–8.0)
Protein, ur: NEGATIVE mg/dL
Specific Gravity, Urine: 1.022 (ref 1.005–1.030)
Urobilinogen, UA: 1 mg/dL (ref 0.0–1.0)

## 2013-07-12 LAB — CBC WITH DIFFERENTIAL/PLATELET
BASOS ABS: 0 10*3/uL (ref 0.0–0.1)
BASOS PCT: 0 % (ref 0–1)
EOS PCT: 6 % — AB (ref 0–5)
Eosinophils Absolute: 0.4 10*3/uL (ref 0.0–0.7)
HEMATOCRIT: 38.9 % (ref 36.0–46.0)
Hemoglobin: 13 g/dL (ref 12.0–15.0)
Lymphocytes Relative: 14 % (ref 12–46)
Lymphs Abs: 1 10*3/uL (ref 0.7–4.0)
MCH: 29.9 pg (ref 26.0–34.0)
MCHC: 33.4 g/dL (ref 30.0–36.0)
MCV: 89.4 fL (ref 78.0–100.0)
Monocytes Absolute: 0.3 10*3/uL (ref 0.1–1.0)
Monocytes Relative: 5 % (ref 3–12)
Neutro Abs: 5.4 10*3/uL (ref 1.7–7.7)
Neutrophils Relative %: 75 % (ref 43–77)
PLATELETS: 239 10*3/uL (ref 150–400)
RBC: 4.35 MIL/uL (ref 3.87–5.11)
RDW: 14.7 % (ref 11.5–15.5)
WBC: 7.1 10*3/uL (ref 4.0–10.5)

## 2013-07-12 LAB — COMPREHENSIVE METABOLIC PANEL
ALBUMIN: 4 g/dL (ref 3.5–5.2)
ALT: 12 U/L (ref 0–35)
AST: 28 U/L (ref 0–37)
Alkaline Phosphatase: 72 U/L (ref 39–117)
BUN: 86 mg/dL — ABNORMAL HIGH (ref 6–23)
CALCIUM: 9.5 mg/dL (ref 8.4–10.5)
CO2: 22 mEq/L (ref 19–32)
CREATININE: 2.79 mg/dL — AB (ref 0.50–1.10)
Chloride: 110 mEq/L (ref 96–112)
GFR calc Af Amer: 16 mL/min — ABNORMAL LOW (ref >90)
GFR calc non Af Amer: 14 mL/min — ABNORMAL LOW (ref >90)
Glucose, Bld: 119 mg/dL — ABNORMAL HIGH (ref 70–99)
Potassium: 4.4 mEq/L (ref 3.7–5.3)
SODIUM: 149 meq/L — AB (ref 137–147)
Total Bilirubin: 0.5 mg/dL (ref 0.3–1.2)
Total Protein: 7.3 g/dL (ref 6.0–8.3)

## 2013-07-12 LAB — URINE MICROSCOPIC-ADD ON

## 2013-07-12 LAB — LIPASE, BLOOD: Lipase: 21 U/L (ref 11–59)

## 2013-07-12 LAB — I-STAT TROPONIN, ED: Troponin i, poc: 0.11 ng/mL (ref 0.00–0.08)

## 2013-07-12 MED ORDER — ASPIRIN EC 81 MG PO TBEC
81.0000 mg | DELAYED_RELEASE_TABLET | Freq: Every morning | ORAL | Status: DC
  Administered 2013-07-15: 81 mg via ORAL
  Filled 2013-07-12 (×3): qty 1

## 2013-07-12 MED ORDER — ACETAMINOPHEN 325 MG PO TABS
650.0000 mg | ORAL_TABLET | Freq: Two times a day (BID) | ORAL | Status: DC
  Administered 2013-07-14 – 2013-07-15 (×2): 650 mg via ORAL
  Filled 2013-07-12 (×6): qty 2

## 2013-07-12 MED ORDER — DEXTROSE 5 % IV SOLN
INTRAVENOUS | Status: DC
  Administered 2013-07-12: 17:00:00 via INTRAVENOUS

## 2013-07-12 MED ORDER — BEPOTASTINE BESILATE 1.5 % OP SOLN: 1.0000 [drp] | Freq: Every day | OPHTHALMIC | Status: DC

## 2013-07-12 MED ORDER — POLYVINYL ALCOHOL 1.4 % OP SOLN
1.0000 [drp] | Freq: Three times a day (TID) | OPHTHALMIC | Status: DC
  Administered 2013-07-14 – 2013-07-15 (×3): 1 [drp] via OPHTHALMIC
  Filled 2013-07-12: qty 15

## 2013-07-12 MED ORDER — ACETAMINOPHEN 325 MG PO TABS: 650.0000 mg | ORAL_TABLET | Freq: Four times a day (QID) | ORAL | Status: DC | PRN

## 2013-07-12 MED ORDER — TWOCAL HN PO LIQD
120.0000 mL | Freq: Four times a day (QID) | ORAL | Status: DC
  Administered 2013-07-14 – 2013-07-15 (×4): 120 mL via ORAL
  Filled 2013-07-12 (×14): qty 237

## 2013-07-12 MED ORDER — ONDANSETRON HCL 4 MG PO TABS: 4.0000 mg | ORAL_TABLET | Freq: Four times a day (QID) | ORAL | Status: DC | PRN

## 2013-07-12 MED ORDER — OLOPATADINE HCL 0.1 % OP SOLN
1.0000 [drp] | Freq: Every day | OPHTHALMIC | Status: DC
  Administered 2013-07-12 – 2013-07-15 (×2): 1 [drp] via OPHTHALMIC
  Filled 2013-07-12: qty 5

## 2013-07-12 MED ORDER — CALCIUM CARBONATE-VITAMIN D 500-200 MG-UNIT PO TABS
1.0000 | ORAL_TABLET | Freq: Every morning | ORAL | Status: DC
  Administered 2013-07-15: 1 via ORAL
  Filled 2013-07-12 (×3): qty 1

## 2013-07-12 MED ORDER — LORAZEPAM 0.5 MG PO TABS: 0.5000 mg | ORAL_TABLET | Freq: Two times a day (BID) | ORAL | Status: DC | PRN

## 2013-07-12 MED ORDER — ONDANSETRON HCL 4 MG/2ML IJ SOLN: 4.0000 mg | Freq: Four times a day (QID) | INTRAMUSCULAR | Status: DC | PRN

## 2013-07-12 MED ORDER — MEMANTINE HCL ER 7 MG PO CP24
28.0000 mg | ORAL_CAPSULE | Freq: Every morning | ORAL | Status: DC
  Administered 2013-07-14 – 2013-07-15 (×2): 28 mg via ORAL
  Filled 2013-07-12 (×3): qty 4

## 2013-07-12 MED ORDER — POLYETHYLENE GLYCOL 3350 17 G PO PACK
17.0000 g | PACK | ORAL | Status: DC
  Filled 2013-07-12 (×2): qty 1

## 2013-07-12 MED ORDER — LORAZEPAM 2 MG/ML PO CONC: 0.5000 mg | Freq: Once | ORAL | Status: AC | PRN

## 2013-07-12 MED ORDER — ASPIRIN 325 MG PO TABS
325.0000 mg | ORAL_TABLET | Freq: Once | ORAL | Status: AC
  Administered 2013-07-12: 325 mg via ORAL
  Filled 2013-07-12: qty 1

## 2013-07-12 MED ORDER — SODIUM CHLORIDE 0.9 % IJ SOLN
3.0000 mL | Freq: Two times a day (BID) | INTRAMUSCULAR | Status: DC
  Administered 2013-07-14: 3 mL via INTRAVENOUS

## 2013-07-12 MED ORDER — HEPARIN SODIUM (PORCINE) 5000 UNIT/ML IJ SOLN
5000.0000 [IU] | Freq: Three times a day (TID) | INTRAMUSCULAR | Status: DC
Start: 2013-07-12 — End: 2013-07-15
  Administered 2013-07-13 – 2013-07-15 (×7): 5000 [IU] via SUBCUTANEOUS
  Filled 2013-07-12 (×11): qty 1

## 2013-07-12 MED ORDER — SACCHAROMYCES BOULARDII 250 MG PO CAPS
250.0000 mg | ORAL_CAPSULE | Freq: Two times a day (BID) | ORAL | Status: DC
  Administered 2013-07-14 – 2013-07-15 (×2): 250 mg via ORAL
  Filled 2013-07-12 (×7): qty 1

## 2013-07-12 MED ORDER — SODIUM CHLORIDE 0.9 % IV BOLUS (SEPSIS)
1000.0000 mL | Freq: Once | INTRAVENOUS | Status: AC
Start: 2013-07-12 — End: 2013-07-12
  Administered 2013-07-12: 1000 mL via INTRAVENOUS

## 2013-07-12 MED ORDER — PROPYLENE GLYCOL 0.6 % OP SOLN: 1.0000 [drp] | Freq: Three times a day (TID) | OPHTHALMIC | Status: DC

## 2013-07-12 MED ORDER — LORATADINE 10 MG PO TABS
10.0000 mg | ORAL_TABLET | Freq: Every day | ORAL | Status: DC
  Administered 2013-07-15: 10 mg via ORAL
  Filled 2013-07-12 (×3): qty 1

## 2013-07-12 MED ORDER — SODIUM CHLORIDE 0.9 % IV SOLN: Freq: Once | INTRAVENOUS | Status: DC

## 2013-07-12 NOTE — Progress Notes (Signed)
**Note De-Identified  Obfuscation** Cardiology Consultation Note  Patient ID: [name], MRN: [id number], DOB/AGE: Aug 30, 1922 80 y.o. Admit date: 07/12/2013   Date of Consult: 07/12/2013 Primary Physician: Bufford Spikes, DO Primary Cardiologist: G. Ladona Ridgel  Chief Complaint: confused Reason for Consult: elevated POC troponin 0.11  HPI: [name] is an 80 y/o F with history of PVD s/p LE stenting, no known CAD (nonischemic nuc 2002 with EF 45%; most recent EF normal 2014), dementia with history of behavioral disturbance, anemia of chronic disease who presented to [facility] with increasing confusion. This is her 3rd ER visit in 6 months. In February she was evaluated by our team for chest pain and had ruled out; no further cardiac workup was felt necessary. Earlier this month she was admitted with increasing confusion and had been more abusive to her roommate than usual. She was treated for UTI and hyponatremia. At discharge she was still encephalopathic with confusion and not making sense, but less agitated. It was felt she may eventually need hospice consult for failure to thrive. Today the patient was brought from her facility for increased confusion and abnormal labwork including hypernatremia (149), ARF (BUN 86/Cr 2.79 when it was normal earlier this month). She told the ER the [employer] was after her. During my interview she is muttering incomprehensibly and begins inappropriately laughing, then back to muttering and repeating herself. CT head nonacute, CXR nonacute, UA with moderate bili, small leuks. We are consulted by internal medicine for POC troponin of 0.11. She is unable to provide any reliable history.  Past Medical History  Diagnosis Date  . Hypertension   . Hyperlipidemia   . Cardiomyopathy   . Complete heart block     a. s/p pacemaker 2001, last gen change [employer] in 2008.  Marland Kitchen PVD (peripheral vascular disease) 2007    a. s/p stenting bilateral legs 2007,   . Allergic rhinitis   . GERD (gastroesophageal  reflux disease)   . Osteoporosis   . Osteoarthritis   . Gallstones     per [place] 11-2008, also no AAA  . 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 Dementia   . Multiple falls   . Anemia of chronic disease   . Hyponatremia   . Failure to thrive in adult   . Hyperkalemia       Most Recent Cardiac Studies: Device obs 10/2012 Pacemaker check in clinic. Normal device function. Thresholds, sensing, impedances consistent with previous measurements. Device programmed to maximize longevity. 1 mode switch lasting 6 seconds. 3 nst episodes---longest was 19 beats. Device programmed  at appropriate safety margins. Histogram distribution appropriate for patient activity level. Device programmed to optimize intrinsic conduction. Estimated longevity 2.0 years ( after testing 1.0 year longevity). Patient enrolled in remote follow-up. ROV in 3 mths w/device clinic and 12 mths w/GT.   2D Echo 05/2012 - Left ventricle: Technically limited. The cavity size was normal. Wall thickness was increased in a pattern of mild LVH. The estimated ejection fraction was 60%. Wall motion was normal; there were no regional wall motion abnormalities. - Aortic valve: Sclerosis without stenosis. - Mitral valve: Mildly calcified annulus. - Right ventricle: Poorly visualized. Suspect RV function is OK. I can not see well enough to comment on pacemaker mentioned in the history. - Pulmonary arteries: PA peak pressure: 32mm Hg (S).  Nuc 2002 IMPRESSION 1. NO EVIDENCE OF ISCHEMIA OR INFARCTION. 2. LEFT VENTRICULAR EJECTION FRACTION 45% WITH DECREASED MOTION AND  THICKENING IN THE SEPTUM AND TO **Note De-Identified  Obfuscation** A LESSER EXTENT INFERIOR WALL.    Surgical History:  Past Surgical History  Procedure Laterality Date  . Pacemaker insertion      permanent   . Breast biopsy benign       Home Meds: Prior to Admission medications   Medication Sig Start Date End Date Taking? Authorizing Provider  acetaminophen  (TYLENOL) 325 MG tablet Take 650 mg by mouth every 12 (twelve) hours.    Yes Historical Provider, MD  acetaminophen (TYLENOL) 325 MG tablet Take 650 mg by mouth every 6 (six) hours as needed for mild pain or fever.   Yes Historical Provider, MD  aspirin EC 81 MG tablet Take 81 mg by mouth every morning.    Yes Historical Provider, MD  Bepotastine Besilate (BEPREVE OP) Place 1 drop into both eyes 2 (two) times daily.   Yes Historical Provider, MD  calcium-vitamin D (OSCAL WITH D) 500-200 MG-UNIT per tablet Take 1 tablet by mouth every morning.    Yes Historical Provider, MD  cyanocobalamin 500 MCG tablet Take 1,000 mcg by mouth every morning.    Yes Historical Provider, MD  diclofenac sodium (VOLTAREN) 1 % GEL Place 4 g onto the skin 4 (four) times daily. Bilateral knees   Yes Historical Provider, MD  fexofenadine (ALLEGRA) 180 MG tablet Take 180 mg by mouth every morning.    Yes Historical Provider, MD  LORazepam (ATIVAN) 0.5 MG tablet Take 0.5 mg by mouth every 12 (twelve) hours as needed (agitation).    Yes Historical Provider, MD  LORazepam (ATIVAN) 2 MG/ML concentrated solution Inject 0.5 mg into the muscle once as needed (agitation).   Yes Historical Provider, MD  Memantine HCl ER (NAMENDA XR) 28 MG CP24 Take 28 mg by mouth every morning.    Yes Historical Provider, MD  OVER THE COUNTER MEDICATION Take 1 scoop by mouth daily. Protein powder. 1 scoop mixed with juice of choice at 1400 every day.   Yes Historical Provider, MD  polyethylene glycol (MIRALAX / GLYCOLAX) packet Take 17 g by mouth every other day.    Yes Historical Provider, MD  Propylene Glycol 0.6 % SOLN Place 1 drop into both eyes 3 (three) times daily.   Yes Historical Provider, MD  saccharomyces boulardii (FLORASTOR) 250 MG capsule Take 250 mg by mouth 2 (two) times daily.   Yes Historical Provider, MD  sodium chloride 1 G tablet Take 1 g by mouth 2 (two) times daily.   Yes Historical Provider, MD  Nutritional Supplements  (NUTRITIONAL DRINK) LIQD Take 120 mLs by mouth 4 (four) times daily. "2 Cal"    Historical Provider, MD    Inpatient Medications:  . aspirin  325 mg Oral Once   . sodium chloride      Allergies:  Allergies  Allergen Reactions  . Amlodipine Other (See Comments)    Dizziness & elevated BP  . Hydrocodone Itching  . Penicillins Other (See Comments)    Per MAR  . Vicodin [Hydrocodone-Acetaminophen] Other (See Comments)    Per MAR    History   Social History  . Marital Status: Widowed    Spouse Name: N/A    Number of Children: N/A  . Years of Education: N/A   Occupational History  . retired    Social History Main Topics  . Smoking status: Former Games developermoker  . Smokeless tobacco: Never Used  . Alcohol Use: No  . Drug Use: No  . Sexual Activity: No   Other Topics Concern  . Not on **Note De-Identified  Obfuscation** file   Social History Narrative   Comes to the office frequently with her nephew [name]...   Lives by her self...   ADL independent...   Still drives...   No children but has family around...Marland KitchenMarland KitchenMarland Kitchen   End of life issues discussed  Today 05-16-10, again she does not like heroic measures, intubation or CPR.     Family History  Problem Relation Age of Onset  . Hypertension    . Breast cancer Neg Hx   . Colon cancer Neg Hx      Review of Systems; unable to reliably obtain secondary to mental status  Labs:  Lab Results  Component Value Date   WBC 7.1 07/12/2013   HGB 13.0 07/12/2013   HCT 38.9 07/12/2013   MCV 89.4 07/12/2013   PLT 239 07/12/2013    Recent Labs Lab 07/12/13 1029  NA 149*  K 4.4  CL 110  CO2 22  BUN 86*  CREATININE 2.79*  CALCIUM 9.5  PROT 7.3  BILITOT 0.5  ALKPHOS 72  ALT 12  AST 28  GLUCOSE 119*   Lab Results  Component Value Date   CHOL 167 05/16/2010   HDL 55.90 05/16/2010   LDLCALC 101* 05/16/2010   TRIG 49.0 05/16/2010   Radiology/Studies:  Dg Chest 1 View  07/12/2013   CLINICAL DATA:  Altered mental status  EXAM: CHEST - 1 VIEW  COMPARISON:  Portable chest  x-ray of June 20, 2013  FINDINGS: The lungs are well-expanded and clear. The cardiopericardial silhouette is mildly enlarged. The pulmonary vascularity is not engorged. The permanent pacemaker is unchanged in position. The bony thorax exhibits no acute abnormality. Degenerative changes of the shoulders are present.  IMPRESSION: There is no acute cardiopulmonary abnormality.   Electronically Signed   By: David  [place]   On: 07/12/2013 10:55   Ct Head Wo Contrast  07/12/2013   CLINICAL DATA:  Increased confusion, dementia, on Bactrim for recent UTI  EXAM: CT HEAD WITHOUT CONTRAST  TECHNIQUE: Contiguous axial images were obtained from the base of the skull through the vertex without intravenous contrast.  COMPARISON:  06/20/2013  FINDINGS: No evidence of parenchymal hemorrhage or extra-axial fluid collection. No mass lesion, mass effect, or midline shift.  No CT evidence of acute infarction.  Extensive subcortical white matter and periventricular small vessel ischemic changes. Intracranial atherosclerosis.  Extensive global cortical atrophy.  Secondary ventriculomegaly.  The visualized paranasal sinuses are essentially clear. The mastoid air cells are unopacified.  No evidence of calvarial fracture.  IMPRESSION: No evidence of acute intracranial abnormality.  Atrophy with small vessel ischemic changes and intracranial atherosclerosis.   Electronically Signed   By: Charline Bills M.D.   On: 07/12/2013 10:49   Ct Head Wo Contrast  06/20/2013   CLINICAL DATA:  Altered mental status.  EXAM: CT HEAD WITHOUT CONTRAST  TECHNIQUE: Contiguous axial images were obtained from the base of the skull through the vertex without intravenous contrast.  COMPARISON:  Head CT scan 06/09/2012.  FINDINGS: Marked cortical atrophy and extensive chronic microvascular ischemic change are again seen. There is no evidence of acute intracranial abnormality including infarct, hemorrhage, mass lesion, mass effect, midline shift or abnormal  extra-axial fluid collection. No hydrocephalus or pneumocephalus.  IMPRESSION: No acute finding.  Marked atrophy and extensive chronic microvascular ischemic change.   Electronically Signed   By: Drusilla Kanner M.D.   On: 06/20/2013 21:38   Dg Chest Portable 1 View  06/20/2013   CLINICAL DATA:  Altered mental status. **Note De-Identified  Obfuscation** EXAM: PORTABLE CHEST - 1 VIEW  COMPARISON:  03/01/2013  FINDINGS: Unchanged appearance of 2 lead cardiac pacemaker. The heart size and mediastinal contours are within normal limits. Both lungs are clear. The visualized skeletal structures are unremarkable. Degenerative changes in the shoulders and spine. Calcification of the aorta.  IMPRESSION: No active disease.   Electronically Signed   By: Burman Nieves M.D.   On: 06/20/2013 22:51    EKG: V paced 79bpm Tele in ED: 10 beats NSVT  Physical Exam: Blood pressure 109/93, pulse 78, temperature 97.5 F (36.4 C), temperature source Oral, resp. rate 20, SpO2 100.00%. General: Well developed elderly F in no acute distress. Head: Normocephalic, atraumatic, sclera non-icteric, no xanthomas, nares are without discharge.  Neck: JVD not elevated. Lungs: Clear bilaterally to auscultation without wheezes, rales, or rhonchi. Breathing is unlabored. Minimally cooperative with lung exam. Heart: RRR with S1 S2. No murmurs, rubs, or gallops appreciated. Patient kept talking through exam despite instructions to please hold off, making it difficult to hear at times. Abdomen: Soft, non-tender, non-distended with normoactive bowel sounds. No hepatomegaly. No rebound/guarding. No obvious abdominal masses. Msk:  Strength and tone appear normal for age. Extremities: No clubbing or cyanosis. No edema.  Distal pedal pulses are 2+ and equal bilaterally. Neuro: Muttering incomprehensibly, often repeating phrases, knows her name but not specific place - only "[place]."   Assessment and Plan:   1. Acute encephalopathy superimposed on chronic dementia  with intermittent behavioral disturbance 2. Acute renal insufficiency with hypernatremia 3. Possible recurrent UTI 4. Minimally elevated POC troponin suspect demand ischemia 5. PVD s/p stenting 2007 6. CHB s/p pacemaker, f/u due 10/2013 7. NSVT  Suspect demand ischemia in the setting of multiple underlying comorbidities including acute renal insufficiency and possible infective process that is driving delirium. Would not heparinize at this time as she is not indicating chest pain. In light of dementia and advanced age, suspect the most prudent thing is to manage her medically and observe for refractory symptoms. Continue aspirin. Would hold off beta blocker given most recent softer BP in ED. Will defer statin initiation to outpatient setting if mental status has cleared.   Signed, Ronie Spies PA-C 07/12/2013, 2:28 PM  Patient seen and examined.  Agree with findings of G Bigford above  I have amended noted to reflect my findings. Patient is an 80 yo with no history of CP  She does have signif dementia well as renal insufficiency.  Admitted with confusion and electrolyte abnormalities.  Exam without fluid overload.  EKG nondiagnositic  Trop 0.11.  At most Trop is related to demand ischemia in setting of renal insufficiency  Pt is not a candidate for any further ischemic evaluation given renal dysfunction and dementia  I would recommend any other tests..  Goal would be to keep her comfortable.  Please call with further questions    Dietrich Pates

## 2013-07-12 NOTE — ED Notes (Signed)
Attempted to start IV 2x

## 2013-07-12 NOTE — ED Notes (Signed)
Patient transported to CT 

## 2013-07-12 NOTE — ED Notes (Signed)
hospitalist at bedside

## 2013-07-12 NOTE — ED Notes (Signed)
Gave I Stat Troponin results to MD Silverio Lay

## 2013-07-12 NOTE — ED Notes (Signed)
Bed: WA04 Expected date:  Expected time:  Means of arrival:  Comments: EMS- abnormal labs

## 2013-07-12 NOTE — H&P (Signed)
[facility] History and Physical  [name] YYF:[id number] DOB: 20-May-1922 DOA: 07/12/2013  Referring physician: Dr Silverio Lay.  PCP: Bufford Spikes, DO   Chief Complaint: Worsening confusion. Abnormal Labs.   HPI: [name] is a 80 y.o. female with PMH significant for dementia with behavior problems(agitation, psychosis) , hyponatremia, recurrent UTI, Cardiomyopathy EF 50 % on ECHO 2014, complete Heart block S/P pacemaker, who was refer to ED for admission due to worsening confusion and abnormal labs, increase Cr and BUN. Patient seen and examined. Patient is alert, pleasantly confuse. She is smiling. She was reading the signs in the room " she read [facility]". When ask about pain, she point to her abdomen. She wants to eat. She doesn't know when was her last BM. She denies chest pain or dyspnea.    Evaluation in the ED: she was found to have sodium at 149, cr at 2.79, BUN at 86, mild elevated troponin at 0.11. CT head with no acute intra cranial abnormalities. Chest x ray with no acute cardio-pulmonary diseases.   Of note patient was recently discharge from hospital on 6-11 after treatment for encephalopathy, UTI and hyponatremia.   Review of Systems:   Past Medical History  Diagnosis Date  . Hypertension   . Hyperlipidemia   . Cardiomyopathy     a. EF 45% by nuc 2002. b. EF normal 2014.  Marland Kitchen Complete heart block     a. s/p pacemaker 2001, last gen change [employer] in 2008.  Marland Kitchen PVD (peripheral vascular disease) 2007    a. s/p stenting bilateral legs 2007,   . Allergic rhinitis   . GERD (gastroesophageal reflux disease)   . Osteoporosis   . Osteoarthritis   . Gallstones     per [place] 11-2008, also no AAA  . 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 Dementia   . Multiple falls   . Anemia of chronic disease   . Hyponatremia   . Failure to thrive in adult   . Hyperkalemia    Past Surgical History  Procedure Laterality Date  .  Pacemaker insertion      permanent   . Breast biopsy benign     Social History:  reports that she has quit smoking. She has never used smokeless tobacco. She reports that she does not drink alcohol or use illicit drugs.  Allergies  Allergen Reactions  . Amlodipine Other (See Comments)    Dizziness & elevated BP  . Hydrocodone Itching  . Penicillins Other (See Comments)    Per MAR  . Vicodin [Hydrocodone-Acetaminophen] Other (See Comments)    Per MAR    Family History  Problem Relation Age of Onset  . Hypertension    . Breast cancer Neg Hx   . Colon cancer Neg Hx      Prior to Admission medications   Medication Sig Start Date End Date Taking? Authorizing Provider  acetaminophen (TYLENOL) 325 MG tablet Take 650 mg by mouth every 12 (twelve) hours.    Yes Historical Provider, MD  acetaminophen (TYLENOL) 325 MG tablet Take 650 mg by mouth every 6 (six) hours as needed for mild pain or fever.   Yes Historical Provider, MD  aspirin EC 81 MG tablet Take 81 mg by mouth every morning.    Yes Historical Provider, MD  Bepotastine Besilate (BEPREVE OP) Place 1 drop into both eyes 2 (two) times daily.   Yes Historical Provider, MD  calcium-vitamin D (OSCAL WITH D) 500-200  MG-UNIT per tablet Take 1 tablet by mouth every morning.    Yes Historical Provider, MD  cyanocobalamin 500 MCG tablet Take 1,000 mcg by mouth every morning.    Yes Historical Provider, MD  diclofenac sodium (VOLTAREN) 1 % GEL Place 4 g onto the skin 4 (four) times daily. Bilateral knees   Yes Historical Provider, MD  fexofenadine (ALLEGRA) 180 MG tablet Take 180 mg by mouth every morning.    Yes Historical Provider, MD  LORazepam (ATIVAN) 0.5 MG tablet Take 0.5 mg by mouth every 12 (twelve) hours as needed (agitation).    Yes Historical Provider, MD  LORazepam (ATIVAN) 2 MG/ML concentrated solution Inject 0.5 mg into the muscle once as needed (agitation).   Yes Historical Provider, MD  Memantine HCl ER (NAMENDA XR) 28 MG  CP24 Take 28 mg by mouth every morning.    Yes Historical Provider, MD  OVER THE COUNTER MEDICATION Take 1 scoop by mouth daily. Protein powder. 1 scoop mixed with juice of choice at 1400 every day.   Yes Historical Provider, MD  polyethylene glycol (MIRALAX / GLYCOLAX) packet Take 17 g by mouth every other day.    Yes Historical Provider, MD  Propylene Glycol 0.6 % SOLN Place 1 drop into both eyes 3 (three) times daily.   Yes Historical Provider, MD  saccharomyces boulardii (FLORASTOR) 250 MG capsule Take 250 mg by mouth 2 (two) times daily.   Yes Historical Provider, MD  sodium chloride 1 G tablet Take 1 g by mouth 2 (two) times daily.   Yes Historical Provider, MD  Nutritional Supplements (NUTRITIONAL DRINK) LIQD Take 120 mLs by mouth 4 (four) times daily. "2 Cal"    Historical Provider, MD   Physical Exam: Filed Vitals:   07/12/13 1500  BP: 152/46  Pulse: 98  Temp:   Resp: 19    BP 152/46  Pulse 98  Temp(Src) 97.5 F (36.4 C) (Oral)  Resp 19  SpO2 100%  General:  Appears calm and comfortable Eyes: PERRL, normal lids, irises & conjunctiva ENT: grossly normal hearing, lips & tongue dry Neck: no LAD, masses or thyromegaly Cardiovascular: RRR, no m/r/g. No LE edema. Respiratory: CTA bilaterally, no w/r/r. Normal respiratory effort. Abdomen: soft, nt,nd, mild epigastrium tenderness Skin: no rash or induration seen on limited exam Musculoskeletal: grossly normal tone BUE/BLE Neurologic: following some command, move upper extremities, limited movement lower extremities.           Labs on Admission:  Basic Metabolic Panel:  Recent Labs Lab 07/12/13 1029  NA 149*  K 4.4  CL 110  CO2 22  GLUCOSE 119*  BUN 86*  CREATININE 2.79*  CALCIUM 9.5   Liver Function Tests:  Recent Labs Lab 07/12/13 1029  AST 28  ALT 12  ALKPHOS 72  BILITOT 0.5  PROT 7.3  ALBUMIN 4.0   No results found for this basename: LIPASE, AMYLASE,  in the last 168 hours No results found for this  basename: AMMONIA,  in the last 168 hours CBC:  Recent Labs Lab 07/12/13 1029  WBC 7.1  NEUTROABS 5.4  HGB 13.0  HCT 38.9  MCV 89.4  PLT 239   Cardiac Enzymes: No results found for this basename: CKTOTAL, CKMB, CKMBINDEX, TROPONINI,  in the last 168 hours  BNP (last 3 results) No results found for this basename: PROBNP,  in the last 8760 hours CBG: No results found for this basename: GLUCAP,  in the last 168 hours  Radiological Exams on Admission: Dg Chest  1 View  07/12/2013   CLINICAL DATA:  Altered mental status  EXAM: CHEST - 1 VIEW  COMPARISON:  Portable chest x-ray of June 20, 2013  FINDINGS: The lungs are well-expanded and clear. The cardiopericardial silhouette is mildly enlarged. The pulmonary vascularity is not engorged. The permanent pacemaker is unchanged in position. The bony thorax exhibits no acute abnormality. Degenerative changes of the shoulders are present.  IMPRESSION: There is no acute cardiopulmonary abnormality.   Electronically Signed   By: David  [place]   On: 07/12/2013 10:55   Ct Head Wo Contrast  07/12/2013   CLINICAL DATA:  Increased confusion, dementia, on Bactrim for recent UTI  EXAM: CT HEAD WITHOUT CONTRAST  TECHNIQUE: Contiguous axial images were obtained from the base of the skull through the vertex without intravenous contrast.  COMPARISON:  06/20/2013  FINDINGS: No evidence of parenchymal hemorrhage or extra-axial fluid collection. No mass lesion, mass effect, or midline shift.  No CT evidence of acute infarction.  Extensive subcortical white matter and periventricular small vessel ischemic changes. Intracranial atherosclerosis.  Extensive global cortical atrophy.  Secondary ventriculomegaly.  The visualized paranasal sinuses are essentially clear. The mastoid air cells are unopacified.  No evidence of calvarial fracture.  IMPRESSION: No evidence of acute intracranial abnormality.  Atrophy with small vessel ischemic changes and intracranial atherosclerosis.    Electronically Signed   By: Charline Bills M.D.   On: 07/12/2013 10:49    EKG: Independently reviewed.   Assessment/Plan Principal Problem:   Acute on chronic renal failure Active Problems:   FTT (failure to thrive) in adult   Acute encephalopathy   Hypernatremia   Elevated troponin   Acute renal failure  1-Acute on chronic renal failure: Probably related to decrease volume, she also was on Bactrim  for UTI. UA with small leukocytes, hyaline cast. Will start IV fluids D 5. Strict I and o. Repeat renal function in am. Bladder scan. Place foley for accurate urine out put.   2-Elevated troponin: EKG with ventricular paced rhythm. Patient denies chest pain. Continue with aspirin. Cycle troponin. Cardiology consulted.   3-Abdominal pain: check lipase and KUB.  4-Dementia// encephalopathy; Continue with Namenda and ativan PRN. Maybe worsening confusion in setting of dehydration.  5-Hypernatremia; will stop sodium tablet. IV fluids. Monitor on D 5, she has history of hyponatremia.    Code Status: DNR, yellow form from facility.  Family Communication: no family at bedside.  Disposition Plan: expect 3 to 4 days inpatient.   Time spent: 75 minutes.   Hartley Barefoot A [facility] Pager [phone]  **Disclaimer: This note may have been dictated with voice recognition software. Similar sounding words can inadvertently be transcribed and this note may contain transcription errors which may not have been corrected upon publication of note.**

## 2013-07-12 NOTE — ED Provider Notes (Signed)
CSN: [id number]     Arrival date & time 07/12/13  0940 History   First MD Initiated Contact with Patient 07/12/13 0945     Chief Complaint  Patient presents with  . Abnormal Lab  . Altered Mental Status     (Consider location/radiation/quality/duration/timing/severity/associated sxs/prior Treatment) The history is provided by the EMS personnel. The history is limited by the condition of the patient.  [name] Wissing is a 80 y.o. female hx of dementia (at [facility]), HTN, HL recurrent UTIs here with AMS. As per facility, she is more confused than usual. She also had decreased food intake. Also had labs done at the facility that showed acute renal failure Cr. 2.5 with BUN 65. Patient recently admitted for UTI and just finished a course of bactrim. Patient unable to give history. Just stating that the FBI is after her.    Level V caveat- dementia   Past Medical History  Diagnosis Date  . Hypertension   . Hyperlipidemia   . Cardiomyopathy   . Pacemaker     [employer] [id number]  . PVD (peripheral vascular disease) 2007    stenting b legs 2007,   . Allergic rhinitis   . GERD (gastroesophageal reflux disease)   . Osteoporosis   . Osteoarthritis   . Gallstones     per [place] 11-[place], also no AAA  . CHF (congestive heart failure)   . Pacemaker   . GERD (gastroesophageal reflux disease)   . 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 Dementia    Past Surgical History  Procedure Laterality Date  . Pacemaker insertion      permanent   . Breast biopsy benign     Family History  Problem Relation Age of Onset  . Hypertension    . Breast cancer Neg Hx   . Colon cancer Neg Hx    History  Substance Use Topics  . Smoking status: Former Games developermoker  . Smokeless tobacco: Never Used  . Alcohol Use: No   OB History   Grav Para Term Preterm Abortions TAB SAB Ect Mult Living                 Review of Systems  Unable to perform ROS:  Dementia      Allergies  Amlodipine; Hydrocodone; Penicillins; and Vicodin  Home Medications   Prior to Admission medications   Medication Sig Start Date End Date Taking? Authorizing Rosemaria Inabinet  acetaminophen (TYLENOL) 325 MG tablet Take 650 mg by mouth every 12 (twelve) hours.    Yes Historical Caria Transue, MD  acetaminophen (TYLENOL) 325 MG tablet Take 650 mg by mouth every 6 (six) hours as needed for mild pain or fever.   Yes Historical Javious Hallisey, MD  aspirin EC 81 MG tablet Take 81 mg by mouth every morning.    Yes Historical Merle Cirelli, MD  Bepotastine Besilate (BEPREVE OP) Place 1 drop into both eyes 2 (two) times daily.   Yes Historical Vinson Tietze, MD  calcium-vitamin D (OSCAL WITH D) 500-200 MG-UNIT per tablet Take 1 tablet by mouth every morning.    Yes Historical Airlie Blumenberg, MD  cyanocobalamin 500 MCG tablet Take 1,000 mcg by mouth every morning.    Yes Historical Afsa Meany, MD  diclofenac sodium (VOLTAREN) 1 % GEL Place 4 g onto the skin 4 (four) times daily. Bilateral knees   Yes Historical Gara Kincade, MD  fexofenadine (ALLEGRA) 180 MG tablet Take 180 mg by mouth every morning.    Yes Historical  Kyel Purk, MD  LORazepam (ATIVAN) 0.5 MG tablet Take 0.5 mg by mouth every 12 (twelve) hours as needed (agitation).    Yes Historical Kalaya Infantino, MD  LORazepam (ATIVAN) 2 MG/ML concentrated solution Inject 0.5 mg into the muscle once as needed (agitation).   Yes Historical Meredeth Furber, MD  Memantine HCl ER (NAMENDA XR) 28 MG CP24 Take 28 mg by mouth every morning.    Yes Historical Ireland Virrueta, MD  OVER THE COUNTER MEDICATION Take 1 scoop by mouth daily. Protein powder. 1 scoop mixed with juice of choice at 1400 every day.   Yes Historical Yazlynn Birkeland, MD  polyethylene glycol (MIRALAX / GLYCOLAX) packet Take 17 g by mouth every other day.    Yes Historical Kehinde Totzke, MD  Propylene Glycol 0.6 % SOLN Place 1 drop into both eyes 3 (three) times daily.   Yes Historical Francine Hannan, MD  saccharomyces boulardii (FLORASTOR)  250 MG capsule Take 250 mg by mouth 2 (two) times daily.   Yes Historical Tiffanye Hartmann, MD  sodium chloride 1 G tablet Take 1 g by mouth 2 (two) times daily.   Yes Historical Anni Hocevar, MD  Nutritional Supplements (NUTRITIONAL DRINK) LIQD Take 120 mLs by mouth 4 (four) times daily. "2 Cal"    Historical Juma Oxley, MD   BP 109/93  Pulse 78  Temp(Src) 97.5 F (36.4 C) (Oral)  Resp 20  SpO2 100% Physical Exam  Nursing note and vitals reviewed. Constitutional:  Demented, confused   HENT:  Head: Normocephalic.  MM slightly dry   Eyes: Conjunctivae are normal. Pupils are equal, round, and reactive to light.  Neck: Normal range of motion. Neck supple.  Cardiovascular: Normal rate, regular rhythm and normal heart sounds.   Pulmonary/Chest: Effort normal and breath sounds normal. No respiratory distress. She has no wheezes. She has no rales.  Abdominal: Soft. Bowel sounds are normal. She exhibits no distension. There is no tenderness. There is no rebound and no guarding.  Musculoskeletal: Normal range of motion. She exhibits no edema.  Neurological:  Confused, moving all extremities   Skin: Skin is warm and dry.  Psychiatric:  Unable     ED Course  Procedures (including critical care time) Labs Review Labs Reviewed  CBC WITH DIFFERENTIAL - Abnormal; Notable for the following:    Eosinophils Relative 6 (*)    All other components within normal limits  COMPREHENSIVE METABOLIC PANEL - Abnormal; Notable for the following:    Sodium 149 (*)    Glucose, Bld 119 (*)    BUN 86 (*)    Creatinine, Ser 2.79 (*)    GFR calc non Af Amer 14 (*)    GFR calc Af Amer 16 (*)    All other components within normal limits  URINALYSIS, ROUTINE W REFLEX MICROSCOPIC - Abnormal; Notable for the following:    Color, Urine AMBER (*)    APPearance CLOUDY (*)    Bilirubin Urine MODERATE (*)    Leukocytes, UA SMALL (*)    All other components within normal limits  URINE MICROSCOPIC-ADD ON - Abnormal; Notable  for the following:    Casts HYALINE CASTS (*)    All other components within normal limits  I-STAT TROPOININ, ED - Abnormal; Notable for the following:    Troponin i, poc 0.11 (*)    All other components within normal limits  URINE CULTURE    Imaging Review Dg Chest 1 View  07/12/2013   CLINICAL DATA:  Altered mental status  EXAM: CHEST - 1 VIEW  COMPARISON:  Portable chest  x-ray of June 20, 2013  FINDINGS: The lungs are well-expanded and clear. The cardiopericardial silhouette is mildly enlarged. The pulmonary vascularity is not engorged. The permanent pacemaker is unchanged in position. The bony thorax exhibits no acute abnormality. Degenerative changes of the shoulders are present.  IMPRESSION: There is no acute cardiopulmonary abnormality.   Electronically Signed   By: David  [place]   On: 07/12/2013 10:55   Ct Head Wo Contrast  07/12/2013   CLINICAL DATA:  Increased confusion, dementia, on Bactrim for recent UTI  EXAM: CT HEAD WITHOUT CONTRAST  TECHNIQUE: Contiguous axial images were obtained from the base of the skull through the vertex without intravenous contrast.  COMPARISON:  06/20/2013  FINDINGS: No evidence of parenchymal hemorrhage or extra-axial fluid collection. No mass lesion, mass effect, or midline shift.  No CT evidence of acute infarction.  Extensive subcortical white matter and periventricular small vessel ischemic changes. Intracranial atherosclerosis.  Extensive global cortical atrophy.  Secondary ventriculomegaly.  The visualized paranasal sinuses are essentially clear. The mastoid air cells are unopacified.  No evidence of calvarial fracture.  IMPRESSION: No evidence of acute intracranial abnormality.  Atrophy with small vessel ischemic changes and intracranial atherosclerosis.   Electronically Signed   By: Charline Bills M.D.   On: 07/12/2013 10:49     EKG Interpretation   Date/Time:  Tuesday July 12 2013 10:05:29 EDT Ventricular Rate:  79 PR Interval:  53 QRS  Duration: 152 QT Interval:  578 QTC Calculation: 663 R Axis:   -69 Text Interpretation:  Ventricular-paced rhythm No further analysis  attempted due to paced rhythm Artifact in lead(s) I II III aVR aVL aVF V1  V2 V6 artifact present, no gross changes since previous  Confirmed by YAO   MD, DAVID ([id number]) on 07/12/2013 10:11:18 AM      MDM   Final diagnoses:  None   BARBARAJEAN KINZLER is a 80 y.o. female hx of dementia here with AMS. Consider infection (likely recurrent UTI) vs dehydration vs cardiac. Will get labs, EKG, trop, UA, CXR. Will hydrate and reassess.   1:59 PM Patient's Na 149. Cr. 2.8. Trop 0.11, likely demand ischemia. Cardiology will follow. Will not start heparin. Will admit to tele.    Richardean Canal, MD 07/12/13 [phone]

## 2013-07-12 NOTE — ED Notes (Addendum)
Per [facility]. PT from [facility]. PMH of dementia, confused at baseline, but staff told [facility] pt is more confused today. Pt also had abnormal labwork of BUN 65 and creatinine 2.48. PT recently finished bactrim for UTI.

## 2013-07-13 DIAGNOSIS — F03918 Unspecified dementia, unspecified severity, with other behavioral disturbance: Secondary | ICD-10-CM

## 2013-07-13 DIAGNOSIS — F0391 Unspecified dementia with behavioral disturbance: Secondary | ICD-10-CM

## 2013-07-13 LAB — SODIUM, URINE, RANDOM: Sodium, Ur: 36 mEq/L

## 2013-07-13 LAB — CBC
HCT: 37.4 % (ref 36.0–46.0)
Hemoglobin: 12.7 g/dL (ref 12.0–15.0)
MCH: 30.3 pg (ref 26.0–34.0)
MCHC: 34 g/dL (ref 30.0–36.0)
MCV: 89.3 fL (ref 78.0–100.0)
PLATELETS: 206 10*3/uL (ref 150–400)
RBC: 4.19 MIL/uL (ref 3.87–5.11)
RDW: 15 % (ref 11.5–15.5)
WBC: 10.3 10*3/uL (ref 4.0–10.5)

## 2013-07-13 LAB — OSMOLALITY, URINE: Osmolality, Ur: 629 mOsm/kg (ref 390–1090)

## 2013-07-13 LAB — BASIC METABOLIC PANEL
Anion gap: 12 (ref 5–15)
BUN: 73 mg/dL — ABNORMAL HIGH (ref 6–23)
CHLORIDE: 110 meq/L (ref 96–112)
CO2: 22 mEq/L (ref 19–32)
Calcium: 9.2 mg/dL (ref 8.4–10.5)
Creatinine, Ser: 1.6 mg/dL — ABNORMAL HIGH (ref 0.50–1.10)
GFR calc Af Amer: 32 mL/min — ABNORMAL LOW (ref >90)
GFR, EST NON AFRICAN AMERICAN: 27 mL/min — AB (ref >90)
GLUCOSE: 103 mg/dL — AB (ref 70–99)
POTASSIUM: 4.1 meq/L (ref 3.7–5.3)
SODIUM: 144 meq/L (ref 137–147)

## 2013-07-13 LAB — MAGNESIUM: Magnesium: 2.6 mg/dL — ABNORMAL HIGH (ref 1.5–2.5)

## 2013-07-13 LAB — CREATININE, URINE, RANDOM: Creatinine, Urine: 145.37 mg/dL

## 2013-07-13 LAB — OSMOLALITY: Osmolality: 325 mOsm/kg — ABNORMAL HIGH (ref 275–300)

## 2013-07-13 MED ORDER — LORAZEPAM 2 MG/ML IJ SOLN
0.2500 mg | Freq: Four times a day (QID) | INTRAMUSCULAR | Status: DC | PRN
  Administered 2013-07-13: 0.5 mg via INTRAVENOUS
  Filled 2013-07-13: qty 1

## 2013-07-13 MED ORDER — SODIUM CHLORIDE 0.9 % IV SOLN
INTRAVENOUS | Status: DC
  Administered 2013-07-13 – 2013-07-14 (×4): via INTRAVENOUS

## 2013-07-13 MED ORDER — LORAZEPAM 2 MG/ML IJ SOLN
0.2500 mg | Freq: Four times a day (QID) | INTRAMUSCULAR | Status: DC | PRN
  Administered 2013-07-14: 0.25 mg via INTRAVENOUS
  Filled 2013-07-13: qty 1

## 2013-07-13 NOTE — Progress Notes (Signed)
INITIAL NUTRITION ASSESSMENT  DOCUMENTATION CODES Per approved criteria  -Not Applicable   INTERVENTION: -Consider liberalizing diet to encourage PO intake -Continue to recommend TwoCal HN TID as tolerated -Will continue to monitor  NUTRITION DIAGNOSIS: Inadequate oral intake related to encephalopathy as evidenced by decreased PO intake for one week  Goal: Pt to meet >/= 90% of their estimated nutrition needs    Monitor:  Total protein/energy intake, labs, weights, diet order  Reason for Assessment: TwoCal HN supplement order/RD Discretion  80 y.o. female  Admitting Dx: Acute encephalopathy  ASSESSMENT: [name] is a 80 y.o. female with PMH significant for dementia with behavior problems(agitation, psychosis) , hyponatremia, recurrent UTI, Cardiomyopathy EF 50 % on ECHO 2014, complete Heart block S/P pacemaker, who was refer to ED for admission due to worsening confusion and abnormal labs, increase Cr and BUN.  -Pt currently too lethargic for PO intake and/or supplement -Contacted [facility]. RN reported pt with decreased PO intake for one week d/t increasing confusing. Has TwoCal HN TID ordered. PO intake of supplement would vary based on pt's mood -Endorsed a 4 lb weight loss over one week. This may be attributed to pt's dehydrated state in conjunction with decreased intake as weight has fluctuated from 169-190 in past 3 month. Receiving IVFs -Pt refusing labs per MD note -Consider liberalizing to regular diet to encourage PO intake once alert. Will modify supplement order to comply with SNF regimen. Encourage as tolerated  Height: Ht Readings from Last 1 Encounters:  07/12/13 5\' 3"  (1.6 m)    Weight: Wt Readings from Last 1 Encounters:  07/13/13 169 lb 8.5 oz (76.9 kg)    Ideal Body Weight: 115 lbs  % Ideal Body Weight: 147%  Wt Readings from Last 10 Encounters:  07/13/13 169 lb 8.5 oz (76.9 kg)  06/21/13 180 lb (81.647 kg)  04/20/13 191 lb  (86.637 kg)  03/01/13 183 lb 14.4 oz (83.416 kg)  10/29/12 180 lb (81.647 kg)  08/20/12 165 lb (74.844 kg)  06/16/12 159 lb 13.3 oz (72.5 kg)  06/11/12 156 lb 12 oz (71.1 kg)  06/01/12 171 lb (77.565 kg)  04/16/12 171 lb (77.565 kg)    Usual Body Weight: 172 lbs per SNF RN  % Usual Body Weight: 98%  BMI:  Body mass index is 30.04 kg/(m^2).  Estimated Nutritional Needs: Kcal: 1400-1600 Protein: 70-80 gram Fluid: >/=1600 ml/daily  Skin: WDL  Diet Order: Cardiac  EDUCATION NEEDS: -No education needs identified at this time   Intake/Output Summary (Last 24 hours) at 07/13/13 1449 Last data filed at 07/13/13 1431  Gross per 24 hour  Intake 1614.58 ml  Output    500 ml  Net 1114.58 ml    Last BM: pta  Labs:   Recent Labs Lab 07/12/13 1029 07/13/13 1300  NA 149* 144  K 4.4 4.1  CL 110 110  CO2 22 22  BUN 86* 73*  CREATININE 2.79* 1.60*  CALCIUM 9.5 9.2  MG  --  2.6*  GLUCOSE 119* 103*    CBG (last 3)  No results found for this basename: GLUCAP,  in the last 72 hours  Scheduled Meds: . acetaminophen  650 mg Oral Q12H  . aspirin EC  81 mg Oral q morning - 10a  . calcium-vitamin D  1 tablet Oral q morning - 10a  . heparin  5,000 Units Subcutaneous 3 times per day  . loratadine  10 mg Oral Daily  . Memantine HCl ER  28  mg Oral q morning - 10a  . olopatadine  1 drop Both Eyes Daily  . polyethylene glycol  17 g Oral QODAY  . polyvinyl alcohol  1 drop Both Eyes TID  . saccharomyces boulardii  250 mg Oral BID  . sodium chloride  3 mL Intravenous Q12H  . TWOCAL HN  120 mL Oral QID    Continuous Infusions: . sodium chloride 100 mL/hr at 07/13/13 1121    Past Medical History  Diagnosis Date  . Hypertension   . Hyperlipidemia   . Cardiomyopathy     a. EF 45% by nuc 2002. b. EF normal 2014.  Marland Kitchen. Complete heart block     a. s/p pacemaker 2001, last gen change [employer] in 2008.  Marland Kitchen. PVD (peripheral vascular disease) 2007    a. s/p stenting bilateral  legs 2007,   . Allergic rhinitis   . GERD (gastroesophageal reflux disease)   . Osteoporosis   . Osteoarthritis   . Gallstones     per [place], also no AAA  . 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 Dementia   . Multiple falls   . Anemia of chronic disease   . Hyponatremia   . Failure to thrive in adult   . Hyperkalemia     Past Surgical History  Procedure Laterality Date  . Pacemaker insertion      permanent   . Breast biopsy benign      Lloyd HugerSarah F Jaritza Duignan MS RD LDN Clinical Dietitian [phone]

## 2013-07-13 NOTE — Progress Notes (Signed)
Clinical Social Work Department BRIEF PSYCHOSOCIAL ASSESSMENT 07/13/2013  Patient:  [name], [name]     Account Number:  [id number]     Admit date:  07/12/2013  Clinical Social Worker:  Orpah Greek  Date/Time:  07/13/2013 11:17 AM  Referred by:  Physician  Date Referred:  07/13/2013 Referred for  Other - See comment   Other Referral:   Admitted from: [facility]   Interview type:  Family Other interview type:   patient's niece, [name] via phone    PSYCHOSOCIAL DATA Living Status:  FACILITY Admitted from facility:  [facility] Level of care:  Skilled Nursing Facility Primary support name:  [name] (niece) h#: [phone]  c#: [phone] Primary support relationship to patient:  FAMILY Degree of support available:   good    CURRENT CONCERNS Current Concerns  Post-Acute Placement   Other Concerns:    SOCIAL WORK ASSESSMENT / PLAN CSW received consult that patient was admitted from [facility].   Assessment/plan status:  Information/Referral to [facility] Other assessment/ plan:   Information/referral to community resources:   CSW completed FL2 and faxed information to [facility], confirmed with Tammy @ SNF that they would be able to take patient back once stable.    PATIENT'S/FAMILY'S RESPONSE TO PLAN OF CARE: Patient's niece is agreeable with plan for patient to return when ready. Patient was recently admitted to [facility] on 6/8 for hypOnatremia and now she admitted 6/30 with hypERnatremia.       Lincoln Maxin, LCSW [facility] Clinical Social Worker cell #: [phone]

## 2013-07-13 NOTE — Progress Notes (Signed)
Pt very agitaed and combative. unable to obtain AM VS.

## 2013-07-13 NOTE — Progress Notes (Signed)
[facility] PROGRESS NOTE  [name] TJQ:[id number] DOB: 1922-08-21 DOA: 07/12/2013 PCP: Bufford Spikes, DO  Assessment/Plan: #1 acute on chronic kidney disease Likely secondary to prerenal azotemia. Patient looks clinically dry however patient refusing physical exam. Patient was also on Bactrim during prior hospitalization for UTI. Urinalysis with small leukocytes 0-2 WBCs. Urine cultures pending. Patient refused labs this morning. Change IV fluids to normal saline at 100 cc per hour. Check a urine sodium and a urine creatinine. Follow. If no improvement in renal function will check a renal ultrasound. Follow.  #2 acute encephalopathy Patient with worsening confusion likely secondary to dehydration in the setting of dementia versus worsening dementia. Patient combative and agitated. Patient's screaming and yelling get out of my room. Patient states he does not like foreigners. Patient refusing physical exam. Chest x-ray is negative. CT of the head is negative. Urine cultures are pending. Continue IV fluids. Follow.  #3 elevated troponin EKG with a ventricular paced rhythm. Patient with no chest pain. Per cardiology likely demand ischemia in the setting of renal insufficiency. Per cardiology patient is not a candidate for any further ischemic evaluation given renal dysfunction and dementia. Continue aspirin.  #4 dehydration IV fluids.  #5 hypernatremia Patient was also tablets on admission which may have contributed in the setting of dehydration to her hyponatremia. Patient currently on D5. Labs are pending. Change D5 normal saline 100 cc per hour. Follow. Salt tablets on hold.  #6 dementia Patient agitated and combative and screaming. Continue Namenda. Ativan when necessary.  #7 hypertension Blood pressure stable follow.  #8 abdominal pain Patient with no complaints of abdominal pain. KUB negative. Lipase within normal limits.  #9 prophylaxis Heparin for DVT  prophylaxis.  Code Status: DO NOT RESUSCITATE Family Communication: No family at bedside. Disposition Plan: Back to her facility when medically stable.   Consultants:  Cardiology: Dr. Dietrich Pates 07/12/2013  Procedures:  Abdominal x-ray 07/12/2013  Chest x-ray 07/12/2013  CT head 07/12/2013  Antibiotics:  None  HPI/Subjective: Patient yelling. Patient states he does not like for orders. Patient states to get out of my room.   Objective: Filed Vitals:   07/12/13 2215  BP: 148/71  Pulse: 75  Temp: 97.2 F (36.2 C)  Resp: 18    Intake/Output Summary (Last 24 hours) at 07/13/13 0934 Last data filed at 07/13/13 0856  Gross per 24 hour  Intake 1311.25 ml  Output    650 ml  Net 661.25 ml   Filed Weights   07/12/13 1621 07/13/13 0622  Weight: 76 kg (167 lb 8.8 oz) 76.9 kg (169 lb 8.5 oz)    Exam:    General:  Patient refused  Cardiovascular: patient refused  Respiratory: patient refused  Abdomen: patient refused  Musculoskeletal: patient refused  Data Reviewed: Basic Metabolic Panel:  Recent Labs Lab 07/12/13 1029  NA 149*  K 4.4  CL 110  CO2 22  GLUCOSE 119*  BUN 86*  CREATININE 2.79*  CALCIUM 9.5   Liver Function Tests:  Recent Labs Lab 07/12/13 1029  AST 28  ALT 12  ALKPHOS 72  BILITOT 0.5  PROT 7.3  ALBUMIN 4.0    Recent Labs Lab 07/12/13 1700  LIPASE 21   No results found for this basename: AMMONIA,  in the last 168 hours CBC:  Recent Labs Lab 07/12/13 1029  WBC 7.1  NEUTROABS 5.4  HGB 13.0  HCT 38.9  MCV 89.4  PLT 239   Cardiac Enzymes: No results found for this basename:  CKTOTAL, CKMB, CKMBINDEX, TROPONINI,  in the last 168 hours BNP (last 3 results) No results found for this basename: PROBNP,  in the last 8760 hours CBG: No results found for this basename: GLUCAP,  in the last 168 hours  No results found for this or any previous visit (from the past 240 hour(s)).   Studies: Dg Chest 1 View  07/12/2013    CLINICAL DATA:  Altered mental status  EXAM: CHEST - 1 VIEW  COMPARISON:  Portable chest x-ray of June 20, 2013  FINDINGS: The lungs are well-expanded and clear. The cardiopericardial silhouette is mildly enlarged. The pulmonary vascularity is not engorged. The permanent pacemaker is unchanged in position. The bony thorax exhibits no acute abnormality. Degenerative changes of the shoulders are present.  IMPRESSION: There is no acute cardiopulmonary abnormality.   Electronically Signed   By: David  [place]   On: 07/12/2013 10:55   Dg Abd 1 View  07/12/2013   CLINICAL DATA:  80 year old female with abdominal pain. Altered mental status. Initial encounter.  EXAM: ABDOMEN - 1 VIEW  COMPARISON:  CT Abdomen and Pelvis 03/01/2013.  FINDINGS: 2 supine views. Stable, non obstructed bowel gas pattern. Negative visualized lung bases. No definite pneumoperitoneum. Mild motion artifact. Osteopenia and degenerative changes. No acute osseous abnormality identified. Partially visible cardiac pacemaker leads at the base the heart.  IMPRESSION: Non obstructed bowel gas pattern.   Electronically Signed   By: Augusto GambleLee  Hall M.D.   On: 07/12/2013 17:12   Ct Head Wo Contrast  07/12/2013   CLINICAL DATA:  Increased confusion, dementia, on Bactrim for recent UTI  EXAM: CT HEAD WITHOUT CONTRAST  TECHNIQUE: Contiguous axial images were obtained from the base of the skull through the vertex without intravenous contrast.  COMPARISON:  06/20/2013  FINDINGS: No evidence of parenchymal hemorrhage or extra-axial fluid collection. No mass lesion, mass effect, or midline shift.  No CT evidence of acute infarction.  Extensive subcortical white matter and periventricular small vessel ischemic changes. Intracranial atherosclerosis.  Extensive global cortical atrophy.  Secondary ventriculomegaly.  The visualized paranasal sinuses are essentially clear. The mastoid air cells are unopacified.  No evidence of calvarial fracture.  IMPRESSION: No evidence  of acute intracranial abnormality.  Atrophy with small vessel ischemic changes and intracranial atherosclerosis.   Electronically Signed   By: Charline BillsSriyesh  Krishnan M.D.   On: 07/12/2013 10:49    Scheduled Meds: . acetaminophen  650 mg Oral Q12H  . aspirin EC  81 mg Oral q morning - 10a  . calcium-vitamin D  1 tablet Oral q morning - 10a  . heparin  5,000 Units Subcutaneous 3 times per day  . loratadine  10 mg Oral Daily  . Memantine HCl ER  28 mg Oral q morning - 10a  . olopatadine  1 drop Both Eyes Daily  . polyethylene glycol  17 g Oral QODAY  . polyvinyl alcohol  1 drop Both Eyes TID  . saccharomyces boulardii  250 mg Oral BID  . sodium chloride  3 mL Intravenous Q12H  . TWOCAL HN  120 mL Oral QID   Continuous Infusions: . sodium chloride      Principal Problem:   Acute encephalopathy Active Problems:   HYPERLIPIDEMIA   HYPERTENSION   History of cardiomyopathy   Dehydration   FTT (failure to thrive) in adult   Hypernatremia   Acute on chronic renal failure   Elevated troponin   Acute renal failure    Time spent: 35 mins  [facility] MD [facility] Pager [phone]. If 7PM-7AM, please contact night-coverage at [email or web address], password [email or web address] 07/13/2013, 9:34 AM  LOS: 1 day

## 2013-07-14 DIAGNOSIS — E860 Dehydration: Secondary | ICD-10-CM

## 2013-07-14 DIAGNOSIS — B370 Candidal stomatitis: Secondary | ICD-10-CM | POA: Clinically undetermined

## 2013-07-14 LAB — BASIC METABOLIC PANEL
ANION GAP: 14 (ref 5–15)
BUN: 47 mg/dL — ABNORMAL HIGH (ref 6–23)
CHLORIDE: 113 meq/L — AB (ref 96–112)
CO2: 19 meq/L (ref 19–32)
Calcium: 8.9 mg/dL (ref 8.4–10.5)
Creatinine, Ser: 1.05 mg/dL (ref 0.50–1.10)
GFR calc non Af Amer: 46 mL/min — ABNORMAL LOW (ref >90)
GFR, EST AFRICAN AMERICAN: 53 mL/min — AB (ref >90)
Glucose, Bld: 86 mg/dL (ref 70–99)
POTASSIUM: 3.9 meq/L (ref 3.7–5.3)
Sodium: 146 mEq/L (ref 137–147)

## 2013-07-14 MED ORDER — SODIUM CHLORIDE 0.45 % IV SOLN
INTRAVENOUS | Status: DC
  Administered 2013-07-14: 15:00:00 via INTRAVENOUS
  Filled 2013-07-14 (×3): qty 1000

## 2013-07-14 MED ORDER — FLUCONAZOLE 100MG IVPB
100.0000 mg | INTRAVENOUS | Status: DC
  Administered 2013-07-15: 100 mg via INTRAVENOUS
  Filled 2013-07-14: qty 50

## 2013-07-14 MED ORDER — FLUCONAZOLE IN SODIUM CHLORIDE 200-0.9 MG/100ML-% IV SOLN
200.0000 mg | Freq: Once | INTRAVENOUS | Status: AC
  Administered 2013-07-14: 200 mg via INTRAVENOUS
  Filled 2013-07-14: qty 100

## 2013-07-14 NOTE — Progress Notes (Signed)
[facility] PROGRESS NOTE  [name] WUJ:[id number] DOB: 1922/07/17 DOA: 07/12/2013 PCP: Bufford Spikes, DO  Assessment/Plan: #1 acute on chronic kidney disease Likely secondary to prerenal azotemia. Patient looks clinically dry however patient refusing physical exam. Patient was also on Bactrim during prior hospitalization for UTI. Urinalysis with small leukocytes 0-2 WBCs. Urine cultures negative. Renal function improved with hydration.  Follow.   #2 acute encephalopathy Patient with worsening confusion likely secondary to dehydration in the setting of dementia versus worsening dementia. Patient combativeness and agitation improved. Patient states he does not like foreigners. Chest x-ray is negative. CT of the head is negative. Urine cultures are negative. Continue IV fluids. Follow.  #3 elevated troponin EKG with a ventricular paced rhythm. Patient with no chest pain. Per cardiology likely demand ischemia in the setting of renal insufficiency. Per cardiology patient is not a candidate for any further ischemic evaluation given renal dysfunction and dementia. Continue aspirin.  #4 dehydration IV fluids.  #5 hypernatremia Patient was on salt tablets on admission which may have contributed in the setting of dehydration to her hyponatremia. Hyponatremia improved. Continue hydration.   #6 dementia Continue Namenda. Ativan when necessary.  #7 hypertension Blood pressure stable follow.  #8 abdominal pain Patient with no complaints of abdominal pain. KUB negative. Lipase within normal limits.  #9 dysphagia Secondary to oral thrush. Will place patient on Diflucan. Follow.  #10 prophylaxis Heparin for DVT prophylaxis.  Code Status: DO NOT RESUSCITATE Family Communication: No family at bedside. Disposition Plan: Back to her facility when medically stable.   Consultants:  Cardiology: Dr. Dietrich Pates 07/12/2013  Procedures:  Abdominal x-ray 07/12/2013  Chest x-ray  07/12/2013  CT head 07/12/2013  Antibiotics:  None  HPI/Subjective: Patient complaining of trouble swallowing.  Objective: Filed Vitals:   07/14/13 0601  BP: 132/53  Pulse: 59  Temp: 98.2 F (36.8 C)  Resp: 20    Intake/Output Summary (Last 24 hours) at 07/14/13 0941 Last data filed at 07/14/13 0610  Gross per 24 hour  Intake 1906.66 ml  Output    900 ml  Net 1006.66 ml   Filed Weights   07/12/13 1621 07/13/13 0622  Weight: 76 kg (167 lb 8.8 oz) 76.9 kg (169 lb 8.5 oz)    Exam:    General:  NAD. Oral thrush  Cardiovascular: RRR  Respiratory: CTAB  Abdomen: Soft, nontender, nondistended, positive bowel sounds.  Musculoskeletal: No clubbing cyanosis or edema  Data Reviewed: Basic Metabolic Panel:  Recent Labs Lab 07/12/13 1029 07/13/13 1300  NA 149* 144  K 4.4 4.1  CL 110 110  CO2 22 22  GLUCOSE 119* 103*  BUN 86* 73*  CREATININE 2.79* 1.60*  CALCIUM 9.5 9.2  MG  --  2.6*   Liver Function Tests:  Recent Labs Lab 07/12/13 1029  AST 28  ALT 12  ALKPHOS 72  BILITOT 0.5  PROT 7.3  ALBUMIN 4.0    Recent Labs Lab 07/12/13 1700  LIPASE 21   No results found for this basename: AMMONIA,  in the last 168 hours CBC:  Recent Labs Lab 07/12/13 1029 07/13/13 1300  WBC 7.1 10.3  NEUTROABS 5.4  --   HGB 13.0 12.7  HCT 38.9 37.4  MCV 89.4 89.3  PLT 239 206   Cardiac Enzymes: No results found for this basename: CKTOTAL, CKMB, CKMBINDEX, TROPONINI,  in the last 168 hours BNP (last 3 results) No results found for this basename: PROBNP,  in the last 8760 hours CBG: No results  found for this basename: GLUCAP,  in the last 168 hours  Recent Results (from the past 240 hour(s))  URINE CULTURE     Status: None   Collection Time    07/12/13 12:15 PM      Result Value Ref Range Status   Specimen Description URINE, CATHETERIZED   Final   Special Requests NONE   Final   Culture  Setup Time     Final   Value: 07/12/2013 20:13     Performed  at [facility] Count     Final   Value: NO GROWTH     Performed at [facility]   Culture     Final   Value: NO GROWTH     Performed at [facility]   Report Status 07/13/2013 FINAL   Final     Studies: Dg Chest 1 View  07/12/2013   CLINICAL DATA:  Altered mental status  EXAM: CHEST - 1 VIEW  COMPARISON:  Portable chest x-ray of June 20, 2013  FINDINGS: The lungs are well-expanded and clear. The cardiopericardial silhouette is mildly enlarged. The pulmonary vascularity is not engorged. The permanent pacemaker is unchanged in position. The bony thorax exhibits no acute abnormality. Degenerative changes of the shoulders are present.  IMPRESSION: There is no acute cardiopulmonary abnormality.   Electronically Signed   By: David  [place]   On: 07/12/2013 10:55   Dg Abd 1 View  07/12/2013   CLINICAL DATA:  80 year old female with abdominal pain. Altered mental status. Initial encounter.  EXAM: ABDOMEN - 1 VIEW  COMPARISON:  CT Abdomen and Pelvis 03/01/2013.  FINDINGS: 2 supine views. Stable, non obstructed bowel gas pattern. Negative visualized lung bases. No definite pneumoperitoneum. Mild motion artifact. Osteopenia and degenerative changes. No acute osseous abnormality identified. Partially visible cardiac pacemaker leads at the base the heart.  IMPRESSION: Non obstructed bowel gas pattern.   Electronically Signed   By: Augusto Gamble M.D.   On: 07/12/2013 17:12   Ct Head Wo Contrast  07/12/2013   CLINICAL DATA:  Increased confusion, dementia, on Bactrim for recent UTI  EXAM: CT HEAD WITHOUT CONTRAST  TECHNIQUE: Contiguous axial images were obtained from the base of the skull through the vertex without intravenous contrast.  COMPARISON:  06/20/2013  FINDINGS: No evidence of parenchymal hemorrhage or extra-axial fluid collection. No mass lesion, mass effect, or midline shift.  No CT evidence of acute infarction.  Extensive subcortical white matter and periventricular small  vessel ischemic changes. Intracranial atherosclerosis.  Extensive global cortical atrophy.  Secondary ventriculomegaly.  The visualized paranasal sinuses are essentially clear. The mastoid air cells are unopacified.  No evidence of calvarial fracture.  IMPRESSION: No evidence of acute intracranial abnormality.  Atrophy with small vessel ischemic changes and intracranial atherosclerosis.   Electronically Signed   By: Charline Bills M.D.   On: 07/12/2013 10:49    Scheduled Meds: . acetaminophen  650 mg Oral Q12H  . aspirin EC  81 mg Oral q morning - 10a  . calcium-vitamin D  1 tablet Oral q morning - 10a  . heparin  5,000 Units Subcutaneous 3 times per day  . loratadine  10 mg Oral Daily  . Memantine HCl ER  28 mg Oral q morning - 10a  . olopatadine  1 drop Both Eyes Daily  . polyethylene glycol  17 g Oral QODAY  . polyvinyl alcohol  1 drop Both Eyes TID  . saccharomyces boulardii  250 mg Oral  BID  . sodium chloride  3 mL Intravenous Q12H  . TWOCAL HN  120 mL Oral QID   Continuous Infusions: . sodium chloride 100 mL/hr at 07/14/13 0607    Principal Problem:   Acute encephalopathy Active Problems:   HYPERLIPIDEMIA   HYPERTENSION   History of cardiomyopathy   Dehydration   FTT (failure to thrive) in adult   Hypernatremia   Acute on chronic renal failure   Elevated troponin   Acute renal failure    Time spent: 35 mins    [facility] Intercommunity HospitalHOMPSON,Lissete Maestas MD [facility] Pager [phone]. If 7PM-7AM, please contact night-coverage at [email or web address], password [email or web address] 07/14/2013, 9:41 AM  LOS: 2 days

## 2013-07-14 NOTE — Evaluation (Deleted)
Clinical/Bedside Swallow Evaluation Patient Details  Name: [name] MRN: [id number] Date of Birth: 02/19/1922  Today's Date: 07/14/2013 Time: [id number] SLP Time Calculation (min): 15 min  Past Medical History:  Past Medical History  Diagnosis Date  . Hypertension   . Hyperlipidemia   . Cardiomyopathy     a. EF 45% by nuc 2002. b. EF normal 2014.  Marland Kitchen. Complete heart block     a. s/p pacemaker 2001, last gen change [employer] in 2008.  Marland Kitchen. PVD (peripheral vascular disease) 2007    a. s/p stenting bilateral legs 2007,   . Allergic rhinitis   . GERD (gastroesophageal reflux disease)   . Osteoporosis   . Osteoarthritis   . Gallstones     per US 11-2008, also no AAA  . 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 Dementia   . Multiple falls   . Anemia of chronic disease   . Hyponatremia   . Failure to thrive in adult   . Hyperkalemia    Past Surgical History:  Past Surgical History  Procedure Laterality Date  . Pacemaker insertion      permanent   . Breast biopsy benign     HPI:  Ashley LouisGertrude K Fister is a 80 y.o. female with PMH significant for dementia with behavior problems(agitation, psychosis) , GERD, hyponatremia, recurrent UTI, Cardiomyopathy EF 50 % on ECHO 2014, complete Heart block S/P pacemaker, who was refer to ED for admission due to worsening confusion and abnormal labs, increase Cr and BUN. Dx with acute on chronic kidney disease, encephalopathy, and dehydration.    Assessment / Plan / Recommendation Clinical Impression  Patient presents with what appears to be a functional oropharyngeal swallow with AMS and c/o odynophagia across consistencies being primarily impacting factors to limited po intake. SLP providing verbal, visual, and tactile cueing for increased awareness of bolus, increasing oral acceptance of pureed solids and thin liquids, both of which were consumed without s/s of aspiration but with consistent grimacing in pain during  swallow and prolonged oral phase with solids. Suspect that function will improve with improving mentation. Unclear cause for odynophagia at this time. Will continue to f/u for diagnostic treatment and potential to advance diet.     Aspiration Risk  Mild    Diet Recommendation Dysphagia 1 (Puree);Thin liquid   Liquid Administration via: Cup;Straw Medication Administration: Crushed with puree Supervision: Patient able to self feed;Full supervision/cueing for compensatory strategies Compensations: Slow rate;Small sips/bites Postural Changes and/or Swallow Maneuvers: Seated upright 90 degrees    Other  Recommendations Oral Care Recommendations: Oral care BID   Follow Up Recommendations   (TBD)    Frequency and Duration min 2x/week  1 week   Pertinent Vitals/Pain N/a     Swallow Study    General HPI: Ashley LouisGertrude K Patras is a 80 y.o. female with PMH significant for dementia with behavior problems(agitation, psychosis) , GERD, hyponatremia, recurrent UTI, Cardiomyopathy EF 50 % on ECHO 2014, complete Heart block S/P pacemaker, who was refer to ED for admission due to worsening confusion and abnormal labs, increase Cr and BUN. Dx with acute on chronic kidney disease, encephalopathy, and dehydration.  Type of Study: Bedside swallow evaluation Previous Swallow Assessment: MBS 10/2010-unable to locate report Diet Prior to this Study: Dysphagia 3 (soft);Thin liquids Temperature Spikes Noted: No Respiratory Status: Room air History of Recent Intubation: No Behavior/Cognition: Confused;Requires cueing;Decreased sustained attention;Doesn't follow directions Oral Cavity - Dentition: Dentures, not available;Missing dentition (dentures not present) Self-Feeding Abilities:  Able to feed self;Needs assist Patient Positioning: Upright in chair Baseline Vocal Quality: Hoarse Volitional Cough: Cognitively unable to elicit Volitional Swallow: Unable to elicit    Oral/Motor/Sensory Function Overall Oral  Motor/Sensory Function: Other (comment) (unable to participate although appears functional )   Ice Chips Ice chips: Not tested   Thin Liquid Thin Liquid: Within functional limits    Nectar Thick Nectar Thick Liquid: Not tested   Honey Thick Honey Thick Liquid: Not tested   Puree Puree: Impaired Presentation: Spoon Oral Phase Impairments: Impaired anterior to posterior transit;Poor awareness of bolus Oral Phase Functional Implications: Prolonged oral transit   Solid   GO   Gyasi Hazzard MA, CCC-SLP [phone]  Solid: Not tested       Ardine Iacovelli Meryl 07/14/2013,11:32 AM

## 2013-07-14 NOTE — Evaluation (Signed)
Clinical/Bedside Swallow Evaluation Patient Details  Name: Ashley LouisGertrude K Titzer MRN: [id number] Date of Birth: 02/19/1922  Today's Date: 07/14/2013 Time: [id number] SLP Time Calculation (min): 15 min  Past Medical History:  Past Medical History  Diagnosis Date  . Hypertension   . Hyperlipidemia   . Cardiomyopathy     a. EF 45% by nuc 2002. b. EF normal 2014.  Marland Kitchen. Complete heart block     a. s/p pacemaker 2001, last gen change [employer] in 2008.  Marland Kitchen. PVD (peripheral vascular disease) 2007    a. s/p stenting bilateral legs 2007,   . Allergic rhinitis   . GERD (gastroesophageal reflux disease)   . Osteoporosis   . Osteoarthritis   . Gallstones     per US 11-2008, also no AAA  . 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 Dementia   . Multiple falls   . Anemia of chronic disease   . Hyponatremia   . Failure to thrive in adult   . Hyperkalemia    Past Surgical History:  Past Surgical History  Procedure Laterality Date  . Pacemaker insertion      permanent   . Breast biopsy benign     HPI:  [name] is a 80 y.o. female with PMH significant for dementia with behavior problems(agitation, psychosis) , GERD, hyponatremia, recurrent UTI, Cardiomyopathy EF 50 % on ECHO 2014, complete Heart block S/P pacemaker, who was refer to ED for admission due to worsening confusion and abnormal labs, increase Cr and BUN. Dx with acute on chronic kidney disease, encephalopathy, and dehydration.    Assessment / Plan / Recommendation Clinical Impression  Patient presents with what appears to be a functional oropharyngeal swallow with AMS and c/o odynophagia across consistencies being primarily impacting factors to limited po intake. SLP providing verbal, visual, and tactile cueing for increased awareness of bolus, increasing oral acceptance of pureed solids and thin liquids, both of which were consumed without s/s of aspiration but with consistent grimacing in pain during  swallow and prolonged oral phase with solids. Suspect that function will improve with improving mentation. Unclear cause for odynophagia at this time however strongly question thrush in pharynx and ? esophagus given white appearance of tongue. Will continue to f/u for diagnostic treatment and potential to advance diet.     Aspiration Risk  Mild    Diet Recommendation Dysphagia 1 (Puree);Thin liquid   Liquid Administration via: Cup;Straw Medication Administration: Crushed with puree Supervision: Patient able to self feed;Full supervision/cueing for compensatory strategies Compensations: Slow rate;Small sips/bites Postural Changes and/or Swallow Maneuvers: Seated upright 90 degrees    Other  Recommendations Oral Care Recommendations: Oral care BID   Follow Up Recommendations   (TBD)    Frequency and Duration min 2x/week  1 week   Pertinent Vitals/Pain     SLP Swallow Goals     Swallow Study Prior Functional Status       General HPI: [name] is a 80 y.o. female with PMH significant for dementia with behavior problems(agitation, psychosis) , GERD, hyponatremia, recurrent UTI, Cardiomyopathy EF 50 % on ECHO 2014, complete Heart block S/P pacemaker, who was refer to ED for admission due to worsening confusion and abnormal labs, increase Cr and BUN. Dx with acute on chronic kidney disease, encephalopathy, and dehydration.  Type of Study: Bedside swallow evaluation Previous Swallow Assessment: MBS 10/2010-unable to locate report Diet Prior to this Study: Dysphagia 3 (soft);Thin liquids Temperature Spikes Noted: No Respiratory Status: Room  air History of Recent Intubation: No Behavior/Cognition: Confused;Requires cueing;Decreased sustained attention;Doesn't follow directions Oral Cavity - Dentition: Dentures, not available;Missing dentition (dentures not present) Self-Feeding Abilities: Able to feed self;Needs assist Patient Positioning: Upright in chair Baseline Vocal  Quality: Hoarse Volitional Cough: Cognitively unable to elicit Volitional Swallow: Unable to elicit    Oral/Motor/Sensory Function Overall Oral Motor/Sensory Function: Other (comment) (unable to participate although appears functional )   Ice Chips Ice chips: Not tested   Thin Liquid Thin Liquid: Within functional limits    Nectar Thick Nectar Thick Liquid: Not tested   Honey Thick Honey Thick Liquid: Not tested   Puree Puree: Impaired Presentation: Spoon Oral Phase Impairments: Impaired anterior to posterior transit;Poor awareness of bolus Oral Phase Functional Implications: Prolonged oral transit   Solid   GO    Solid: Not tested       Serafino Burciaga Meryl 07/14/2013,11:39 AM

## 2013-07-15 DIAGNOSIS — R1310 Dysphagia, unspecified: Secondary | ICD-10-CM | POA: Diagnosis present

## 2013-07-15 LAB — BASIC METABOLIC PANEL
ANION GAP: 11 (ref 5–15)
BUN: 33 mg/dL — ABNORMAL HIGH (ref 6–23)
CHLORIDE: 113 meq/L — AB (ref 96–112)
CO2: 20 meq/L (ref 19–32)
Calcium: 8.2 mg/dL — ABNORMAL LOW (ref 8.4–10.5)
Creatinine, Ser: 0.91 mg/dL (ref 0.50–1.10)
GFR calc Af Amer: 63 mL/min — ABNORMAL LOW (ref >90)
GFR calc non Af Amer: 54 mL/min — ABNORMAL LOW (ref >90)
Glucose, Bld: 85 mg/dL (ref 70–99)
POTASSIUM: 3.9 meq/L (ref 3.7–5.3)
Sodium: 144 mEq/L (ref 137–147)

## 2013-07-15 MED ORDER — FLUCONAZOLE 100 MG PO TABS: 100.0000 mg | ORAL_TABLET | Freq: Every day | ORAL | Status: DC

## 2013-07-15 MED ORDER — ONDANSETRON HCL 4 MG PO TABS: 4.0000 mg | ORAL_TABLET | Freq: Four times a day (QID) | ORAL | Status: DC | PRN

## 2013-07-15 MED ORDER — LORAZEPAM 0.5 MG PO TABS: 0.5000 mg | ORAL_TABLET | Freq: Two times a day (BID) | ORAL | Status: DC | PRN

## 2013-07-15 NOTE — Progress Notes (Addendum)
Speech Language Pathology Treatment: Dysphagia  Patient Details Name: [name] MRN: [id number] DOB: August 27, 1922 Today's Date: 07/15/2013 Time: 3007-6226 SLP Time Calculation (min): 11 min  Assessment / Plan / Recommendation Clinical Impression  Pt with intake documented as 25% per doc flowsheet, she is afebrile with lungs clear to auscultation.  Pt did not follow SLP commands and repeatedly asked for Flossie. Nurse tech reports a female had recently left the pt's room.    Pt did consume water via straw with timely swallow and clear voice after swallowing.  She continued to wince with swallow but reported water felt good.  Did not locate dentures in the room for this pt but she continued with whitish coating on tongue and erythema posterior oral cavity.    Social Investment banker, operational informed this SLP that pt is to dc today.  Recommend to continue puree/thin and advance when pt can utilize dentures adequately as pt appears with intact swallow.    HPI HPI: [name] is a 80 y.o. female with PMH significant for dementia with behavior problems(agitation, psychosis) , GERD, hyponatremia, recurrent UTI, Cardiomyopathy EF 50 % on ECHO 2014, complete Heart block S/P pacemaker, who was refer to ED for admission due to worsening confusion and abnormal labs, increase Cr and BUN. Dx with acute on chronic kidney disease, encephalopathy, and dehydration.    Pertinent Vitals Afebrile, decreased  SLP Plan  Discharge SLP treatment due to (comment) (per social worker pt to dc today)    Recommendations Diet recommendations: Dysphagia 1 (puree);Thin liquid Liquids provided via: Straw;Cup Medication Administration:  (as tolerated) Supervision: Patient able to self feed;Intermittent supervision to cue for compensatory strategies Compensations: Slow rate;Small sips/bites Postural Changes and/or Swallow Maneuvers: Seated upright 90 degrees              Oral Care Recommendations: Oral care BID  (continue monitoring for thrush resolution) Follow up Recommendations: None Plan: Discharge SLP treatment due to (comment) (per social worker pt to [employer] today)    GO     Ashley Burnet, MS [facility] SLP [phone]

## 2013-07-15 NOTE — Progress Notes (Signed)
Report called to [facility] lpn @ [facility].

## 2013-07-15 NOTE — Discharge Summary (Signed)
Physician Discharge Summary  [name] ZOX:[id number] DOB: [birth year] DOA: 07/12/2013  PCP: Bufford Spikes, DO  Admit date: 07/12/2013 Discharge date: 07/15/2013  Time spent: 70 minutes  Recommendations for Outpatient Follow-up:  1. Followup with M.D. at the skilled nursing facility. Patient will need a basic metabolic profile done in one week to followup on electrolytes and renal function. Patient's oral thrush will need to be reassessed patient is being discharged on 2 weeks worth of Diflucan. Patient also need to be followed by speech therapy for evaluation and advancement of diet.  Discharge Diagnoses:  Principal Problem:   Acute on chronic renal failure Active Problems:   Acute encephalopathy   Hypernatremia   HYPERLIPIDEMIA   HYPERTENSION   History of cardiomyopathy   Dehydration   FTT (failure to thrive) in adult   Elevated troponin   Acute renal failure   Thrush, oral   Odynophagia   Discharge Condition: Stable and improved  Diet recommendation: Pure diet (dysphagia 1 diet)/heart healthy  Filed Weights   07/12/13 1621 07/13/13 0622 07/15/13 0511  Weight: 76 kg (167 lb 8.8 oz) 76.9 kg (169 lb 8.5 oz) 78.3 kg (172 lb 9.9 oz)    History of present illness:   [name] is a 80 y.o. female with PMH significant for dementia with behavior problems(agitation, psychosis) , hyponatremia, recurrent UTI, Cardiomyopathy EF 50 % on ECHO 2014, complete Heart block S/P pacemaker, who was refer to ED for admission due to worsening confusion and abnormal labs, increase Cr and BUN. Patient seen and examined. Patient is alert, pleasantly confuse. She is smiling. She was reading the signs in the room " she read [facility]". When ask about pain, she point to her abdomen. She wants to eat. She doesn't know when was her last BM. She denies chest pain or dyspnea.  Evaluation in the ED: she was found to have sodium at 149, cr at 2.79, BUN at 86, mild elevated troponin at 0.11.  CT head with no acute intra cranial abnormalities. Chest x ray with no acute cardio-pulmonary diseases.  Of note patient was recently discharge from hospital on 6-11 after treatment for encephalopathy, UTI and hyponatremia.     Hospital Course:  #1 acute on chronic kidney disease  Likely secondary to prerenal azotemia. Patient looked clinically dry on exam. On admission patient was noted to have a creatinine of 2.79. Patient was also on Bactrim during prior hospitalization for UTI. Urinalysis with small leukocytes 0-2 WBCs. Urine cultures negative. Patient was hydrated with IV fluids renal function improved on a daily basis such that by day of discharge patient's creatinine was down to 0.91. Patient be discharged in stable and improved condition.  #2 acute encephalopathy  Patient with worsening confusion likely secondary to dehydration in the setting of dementia versus worsening dementia. Patient noted to have some agitation and combativeness during the hospitalization. Patient's agitation and combativeness improved. Chest x-ray which was done was negative. Urine cultures which were done were negative. CT of the head which was done was negative. Patient improved clinically and was close to baseline by day of discharge. #3 elevated troponin  Cardiac enzymes were drawn on admission patient had one elevated troponin. EKG with a ventricular paced rhythm. Patient with no chest pain. Cardiology was consulted. Per cardiology likely demand ischemia in the setting of renal insufficiency. Per cardiology patient is not a candidate for any further ischemic evaluation given renal dysfunction and dementia. Continued on aspirin.  #4 dehydration  Patient was  noted to be dehydrated on admission. Patient was hydrated with IV fluids was euvolemic by day of discharge.  #5 hypernatremia  Patient was on salt tablets on admission which may have contributed in the setting of dehydration to her hyponatremia. Patient was  hydrated with IV fluids with improvement in her hyponatremia and resolution. Salt tablets have been discontinued. Outpatient followup.  #6 dementia  Continued on Namenda. Ativan when necessary.  #7 hypertension  Blood pressure remained stable throughout the hospitalization.  #8 abdominal pain  Patient had some complaints of abdominal pain during the hospitalization. KUB which was done was negative. Lipase levels within normal limits. Patient's abdominal pain resolved.  #9 odyonophagia/oral thrush During the hospitalization patient had some complaints of difficulty swallowing. Secondary to oral thrush. Patient was started on oral Diflucan with some improvement. Patient was seen by speech therapy and placed on a pured diet. Patient tolerated. Diet. She'll be discharged in a pured diet and Diflucan and will need to be evaluated by speech therapy at the facility for advancement of diet.      Procedures: Abdominal x-ray 07/12/2013  Chest x-ray 07/12/2013  CT head 07/12/2013     Consultations: Cardiology: Dr. Dietrich Pates 07/12/2013   Discharge Exam: Filed Vitals:   07/15/13 1336  BP: 120/58  Pulse: 65  Temp: 97.7 F (36.5 C)  Resp: 18    General: NAD Cardiovascular: RRR Respiratory: CTAB  Discharge Instructions You were cared for by a hospitalist during your hospital stay. If you have any questions about your discharge medications or the care you received while you were in the hospital after you are discharged, you can call the unit and asked to speak with the hospitalist on call if the hospitalist that took care of you is not available. Once you are discharged, your primary care physician will handle any further medical issues. Please note that NO REFILLS for any discharge medications will be authorized once you are discharged, as it is imperative that you return to your primary care physician (or establish a relationship with a primary care physician if you do not have one) for  your aftercare needs so that they can reassess your need for medications and monitor your lab values.      Discharge Instructions   Diet - low sodium heart healthy    Complete by:  As directed   PUREE DIET AND ADVANCE AS TOLERATED.     Discharge instructions    Complete by:  As directed   Follow up with MD at SNF. Will need speech therapy to followup at facility for advancement of diet.     Increase activity slowly    Complete by:  As directed             Medication List    STOP taking these medications       ciprofloxacin 500 MG tablet  Commonly known as:  CIPRO     sodium chloride 1 G tablet      TAKE these medications       acetaminophen 325 MG tablet  Commonly known as:  TYLENOL  Take 650 mg by mouth every 12 (twelve) hours.     acetaminophen 325 MG tablet  Commonly known as:  TYLENOL  Take 650 mg by mouth every 6 (six) hours as needed for mild pain or fever.     aspirin EC 81 MG tablet  Take 81 mg by mouth every morning.     BEPREVE OP  Place 1 drop into both  eyes 2 (two) times daily.     calcium-vitamin D 500-200 MG-UNIT per tablet  Commonly known as:  OSCAL WITH D  Take 1 tablet by mouth every morning.     cyanocobalamin 500 MCG tablet  Take 1,000 mcg by mouth every morning.     diclofenac sodium 1 % Gel  Commonly known as:  VOLTAREN  Place 4 g onto the skin 4 (four) times daily. Bilateral knees     fexofenadine 180 MG tablet  Commonly known as:  ALLEGRA  Take 180 mg by mouth every morning.     fluconazole 100 MG tablet  Commonly known as:  DIFLUCAN  Take 1 tablet (100 mg total) by mouth daily.     LORazepam 2 MG/ML concentrated solution  Commonly known as:  ATIVAN  Inject 0.5 mg into the muscle once as needed (agitation).     LORazepam 0.5 MG tablet  Commonly known as:  ATIVAN  Take 1 tablet (0.5 mg total) by mouth every 12 (twelve) hours as needed (agitation).     NAMENDA XR 28 MG Cp24  Generic drug:  Memantine HCl ER  Take 28 mg by  mouth every morning.     NUTRITIONAL DRINK Liqd  Take 120 mLs by mouth 4 (four) times daily. "2 Cal"     ondansetron 4 MG tablet  Commonly known as:  ZOFRAN  Take 1 tablet (4 mg total) by mouth every 6 (six) hours as needed for nausea.     OVER THE COUNTER MEDICATION  Take 1 scoop by mouth daily. Protein powder. 1 scoop mixed with juice of choice at 1400 every day.     polyethylene glycol packet  Commonly known as:  MIRALAX / GLYCOLAX  Take 17 g by mouth every other day.     Propylene Glycol 0.6 % Soln  Place 1 drop into both eyes 3 (three) times daily.     saccharomyces boulardii 250 MG capsule  Commonly known as:  FLORASTOR  Take 250 mg by mouth 2 (two) times daily.       Allergies  Allergen Reactions  . Amlodipine Other (See Comments)    Dizziness & elevated BP  . Hydrocodone Itching  . Penicillins Other (See Comments)    Per MAR  . Vicodin [Hydrocodone-Acetaminophen] Other (See Comments)    Per MAR   Follow-up Information   Please follow up. (F/U WITH MD AT SNF.)       Please follow up. (f/u with Speech therapy at SNF)        The results of significant diagnostics from this hospitalization (including imaging, microbiology, ancillary and laboratory) are listed below for reference.    Significant Diagnostic Studies: Dg Chest 1 View  07/12/2013   CLINICAL DATA:  Altered mental status  EXAM: CHEST - 1 VIEW  COMPARISON:  Portable chest x-ray of June 20, 2013  FINDINGS: The lungs are well-expanded and clear. The cardiopericardial silhouette is mildly enlarged. The pulmonary vascularity is not engorged. The permanent pacemaker is unchanged in position. The bony thorax exhibits no acute abnormality. Degenerative changes of the shoulders are present.  IMPRESSION: There is no acute cardiopulmonary abnormality.   Electronically Signed   By: David  [place]azil[place]   On: 07/12/2013 10:55   Dg Abd 1 View  07/12/2013   CLINICAL DATA:  80 year old female with abdominal pain. Altered  mental status. Initial encounter.  EXAM: ABDOMEN - 1 VIEW  COMPARISON:  CT Abdomen and Pelvis 03/01/2013.  FINDINGS: 2 supine views. Stable, non obstructed  bowel gas pattern. Negative visualized lung bases. No definite pneumoperitoneum. Mild motion artifact. Osteopenia and degenerative changes. No acute osseous abnormality identified. Partially visible cardiac pacemaker leads at the base the heart.  IMPRESSION: Non obstructed bowel gas pattern.   Electronically Signed   By: Augusto Gamble M.D.   On: 07/12/2013 17:12   Ct Head Wo Contrast  07/12/2013   CLINICAL DATA:  Increased confusion, dementia, on Bactrim for recent UTI  EXAM: CT HEAD WITHOUT CONTRAST  TECHNIQUE: Contiguous axial images were obtained from the base of the skull through the vertex without intravenous contrast.  COMPARISON:  06/20/2013  FINDINGS: No evidence of parenchymal hemorrhage or extra-axial fluid collection. No mass lesion, mass effect, or midline shift.  No CT evidence of acute infarction.  Extensive subcortical white matter and periventricular small vessel ischemic changes. Intracranial atherosclerosis.  Extensive global cortical atrophy.  Secondary ventriculomegaly.  The visualized paranasal sinuses are essentially clear. The mastoid air cells are unopacified.  No evidence of calvarial fracture.  IMPRESSION: No evidence of acute intracranial abnormality.  Atrophy with small vessel ischemic changes and intracranial atherosclerosis.   Electronically Signed   By: Charline Bills M.D.   On: 07/12/2013 10:49   Ct Head Wo Contrast  06/20/2013   CLINICAL DATA:  Altered mental status.  EXAM: CT HEAD WITHOUT CONTRAST  TECHNIQUE: Contiguous axial images were obtained from the base of the skull through the vertex without intravenous contrast.  COMPARISON:  Head CT scan 06/09/2012.  FINDINGS: Marked cortical atrophy and extensive chronic microvascular ischemic change are again seen. There is no evidence of acute intracranial abnormality including  infarct, hemorrhage, mass lesion, mass effect, midline shift or abnormal extra-axial fluid collection. No hydrocephalus or pneumocephalus.  IMPRESSION: No acute finding.  Marked atrophy and extensive chronic microvascular ischemic change.   Electronically Signed   By: Drusilla Kanner M.D.   On: 06/20/2013 21:38   Dg Chest Portable 1 View  06/20/2013   CLINICAL DATA:  Altered mental status.  EXAM: PORTABLE CHEST - 1 VIEW  COMPARISON:  03/01/2013  FINDINGS: Unchanged appearance of 2 lead cardiac pacemaker. The heart size and mediastinal contours are within normal limits. Both lungs are clear. The visualized skeletal structures are unremarkable. Degenerative changes in the shoulders and spine. Calcification of the aorta.  IMPRESSION: No active disease.   Electronically Signed   By: Burman Nieves M.D.   On: 06/20/2013 22:51    Microbiology: Recent Results (from the past 240 hour(s))  URINE CULTURE     Status: None   Collection Time    07/12/13 12:15 PM      Result Value Ref Range Status   Specimen Description URINE, CATHETERIZED   Final   Special Requests NONE   Final   Culture  Setup Time     Final   Value: 07/12/2013 20:13     Performed at [facility] Count     Final   Value: NO GROWTH     Performed at [facility]   Culture     Final   Value: NO GROWTH     Performed at [facility]   Report Status 07/13/2013 FINAL   Final     Labs: Basic Metabolic Panel:  Recent Labs Lab 07/12/13 1029 07/13/13 1300 07/14/13 1024 07/15/13 0403  NA 149* 144 146 144  K 4.4 4.1 3.9 3.9  CL 110 110 113* 113*  CO2 22 22 19 20   GLUCOSE 119* 103* 86 85  BUN 86* 73* 47* 33*  CREATININE 2.79* 1.60* 1.05 0.91  CALCIUM 9.5 9.2 8.9 8.2*  MG  --  2.6*  --   --    Liver Function Tests:  Recent Labs Lab 07/12/13 1029  AST 28  ALT 12  ALKPHOS 72  BILITOT 0.5  PROT 7.3  ALBUMIN 4.0    Recent Labs Lab 07/12/13 1700  LIPASE 21   No results found for this  basename: AMMONIA,  in the last 168 hours CBC:  Recent Labs Lab 07/12/13 1029 07/13/13 1300  WBC 7.1 10.3  NEUTROABS 5.4  --   HGB 13.0 12.7  HCT 38.9 37.4  MCV 89.4 89.3  PLT 239 206   Cardiac Enzymes: No results found for this basename: CKTOTAL, CKMB, CKMBINDEX, TROPONINI,  in the last 168 hours BNP: BNP (last 3 results) No results found for this basename: PROBNP,  in the last 8760 hours CBG: No results found for this basename: GLUCAP,  in the last 168 hours     Signed:  [facility] MD [facility] 07/15/2013, 1:43 PM

## 2013-07-15 NOTE — Progress Notes (Signed)
Patient is set to discharge back to [facility] today. Patient & niece, Flossie aware. Discharge packet given to RN, Okey Regal. [facility] called for transport.   Ashley Maxin, LCSW [facility] Clinical Social Worker cell #: [phone]

## 2013-07-19 ENCOUNTER — Non-Acute Institutional Stay (SKILLED_NURSING_FACILITY): Payer: Medicare Other | Admitting: Internal Medicine

## 2013-07-19 DIAGNOSIS — F03918 Unspecified dementia, unspecified severity, with other behavioral disturbance: Secondary | ICD-10-CM

## 2013-07-19 DIAGNOSIS — N179 Acute kidney failure, unspecified: Secondary | ICD-10-CM

## 2013-07-19 DIAGNOSIS — R1310 Dysphagia, unspecified: Secondary | ICD-10-CM

## 2013-07-19 DIAGNOSIS — E860 Dehydration: Secondary | ICD-10-CM

## 2013-07-19 DIAGNOSIS — E870 Hyperosmolality and hypernatremia: Secondary | ICD-10-CM

## 2013-07-19 DIAGNOSIS — G9340 Encephalopathy, unspecified: Secondary | ICD-10-CM

## 2013-07-19 DIAGNOSIS — F0391 Unspecified dementia with behavioral disturbance: Secondary | ICD-10-CM

## 2013-07-19 DIAGNOSIS — R778 Other specified abnormalities of plasma proteins: Secondary | ICD-10-CM

## 2013-07-19 DIAGNOSIS — N189 Chronic kidney disease, unspecified: Secondary | ICD-10-CM

## 2013-07-19 DIAGNOSIS — I10 Essential (primary) hypertension: Secondary | ICD-10-CM

## 2013-07-19 DIAGNOSIS — R7989 Other specified abnormal findings of blood chemistry: Secondary | ICD-10-CM

## 2013-07-19 DIAGNOSIS — R109 Unspecified abdominal pain: Secondary | ICD-10-CM

## 2013-07-19 NOTE — Progress Notes (Signed)
MRN: [id number] Name: [name]  Sex: female Age: 80 y.o. DOB: Jun 11, 1922  PSC #: Ronni Rumble Facility/Room: 227 Level Of Care: SNF Provider: Merrilee Seashore D Emergency Contacts: Extended Emergency Contact Information Primary Emergency Contact: Jackson,Flossie Address: [address]          [place] Home Phone: [phone] Mobile Phone: [phone] Relation: Niece Secondary Emergency Contact: Gwynn,John&Karen  [place] Home Phone: [phone] Relation: Nephew  Code Status:   Allergies: Amlodipine; Hydrocodone; Penicillins; and Vicodin  Chief Complaint  Patient presents with  . nursing home admission    HPI: Patient is 80 y.o. female who was admitted to hospital for dehydration 2/2 poor po intake and hypernatremia.  Past Medical History  Diagnosis Date  . Hypertension   . Hyperlipidemia   . Cardiomyopathy     a. EF 45% by nuc 2002. b. EF normal 2014.  Marland Kitchen Complete heart block     a. s/p pacemaker 2001, last gen change [employer] in 2008.  Marland Kitchen PVD (peripheral vascular disease) 2007    a. s/p stenting bilateral legs 2007,   . Allergic rhinitis   . GERD (gastroesophageal reflux disease)   . Osteoporosis   . Osteoarthritis   . Gallstones     per [place] 11-2008, also no AAA  . 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 Dementia   . Multiple falls   . Anemia of chronic disease   . Hyponatremia   . Failure to thrive in adult   . Hyperkalemia     Past Surgical History  Procedure Laterality Date  . Pacemaker insertion      permanent   . Breast biopsy benign        Medication List       This list is accurate as of: 07/19/13 11:59 PM.  Always use your most recent med list.               acetaminophen 325 MG tablet  Commonly known as:  TYLENOL  Take 650 mg by mouth every 12 (twelve) hours.     acetaminophen 325 MG tablet  Commonly known as:  TYLENOL  Take 650 mg by mouth  every 6 (six) hours as needed for mild pain or fever.     aspirin EC 81 MG tablet  Take 81 mg by mouth every morning.     BEPREVE OP  Place 1 drop into both eyes 2 (two) times daily.     calcium-vitamin D 500-200 MG-UNIT per tablet  Commonly known as:  OSCAL WITH D  Take 1 tablet by mouth every morning.     cyanocobalamin 500 MCG tablet  Take 1,000 mcg by mouth every morning.     diclofenac sodium 1 % Gel  Commonly known as:  VOLTAREN  Place 4 g onto the skin 4 (four) times daily. Bilateral knees     fexofenadine 180 MG tablet  Commonly known as:  ALLEGRA  Take 180 mg by mouth every morning.     fluconazole 100 MG tablet  Commonly known as:  DIFLUCAN  Take 100 mg by mouth daily.     fluconazole 100 MG tablet  Commonly known as:  DIFLUCAN  Take 1 tablet (100 mg total) by mouth daily.     LORazepam 2 MG/ML concentrated solution  Commonly known as:  ATIVAN  Inject 0.5 mg into the muscle once as needed (agitation).     LORazepam 0.5 MG tablet  Commonly known as:  ATIVAN  Take 0.5 mg by mouth every 12 (twelve) hours as needed for anxiety.     LORazepam 2 MG/ML concentrated solution  Commonly known as:  ATIVAN  Inject 0.5 mg into the muscle once as needed for anxiety.     LORazepam 0.5 MG tablet  Commonly known as:  ATIVAN  Take 1 tablet (0.5 mg total) by mouth every 12 (twelve) hours as needed (agitation).     NAMENDA XR 28 MG Cp24  Generic drug:  Memantine HCl ER  Take 28 mg by mouth every morning.     NUTRITIONAL DRINK Liqd  Take 120 mLs by mouth 4 (four) times daily. "2 Cal"     ondansetron 4 MG tablet  Commonly known as:  ZOFRAN  Take 4 mg by mouth every 6 (six) hours as needed for nausea or vomiting.     ondansetron 4 MG tablet  Commonly known as:  ZOFRAN  Take 1 tablet (4 mg total) by mouth every 6 (six) hours as needed for nausea.     OVER THE COUNTER MEDICATION  Take 1 scoop by mouth daily. Protein powder. 1 scoop mixed with juice of choice at 1400  every day.     polyethylene glycol packet  Commonly known as:  MIRALAX / GLYCOLAX  Take 17 g by mouth every other day.     Propylene Glycol 0.6 % Soln  Place 1 drop into both eyes 3 (three) times daily.     saccharomyces boulardii 250 MG capsule  Commonly known as:  FLORASTOR  Take 250 mg by mouth 2 (two) times daily.        Meds ordered this encounter  Medications  . ondansetron (ZOFRAN) 4 MG tablet    Sig: Take 4 mg by mouth every 6 (six) hours as needed for nausea or vomiting.  . fluconazole (DIFLUCAN) 100 MG tablet    Sig: Take 100 mg by mouth daily.  Marland Kitchen. LORazepam (ATIVAN) 0.5 MG tablet    Sig: Take 0.5 mg by mouth every 12 (twelve) hours as needed for anxiety.  Marland Kitchen. LORazepam (ATIVAN) 2 MG/ML concentrated solution    Sig: Inject 0.5 mg into the muscle once as needed for anxiety.    Immunization History  Administered Date(s) Administered  . Influenza Whole 11/05/2009, 10/25/2012  . Pneumococcal Polysaccharide-23 08/24/2000, 03/03/2008  . Pneumococcal-Unspecified 06/21/2012  . Td 06/17/2001    History  Substance Use Topics  . Smoking status: Former Games developermoker  . Smokeless tobacco: Never Used  . Alcohol Use: No    Family history is noncontributory    Review of Systems  DATA OBTAINED: , nurse has no concerns;pt UTO 2/2 confusion .   Filed Vitals:   07/19/13 2257  BP: 128/80  Pulse: 72  Temp: 97.6 F (36.4 C)  Resp: 18    Physical Exam  GENERAL APPEARANCE: Alert, conversant. Appropriately groomed. No acute distress.  SKIN: No diaphoresis rash HEAD: Normocephalic, atraumatic  EYES: Conjunctiva/lids clear. Pupils round, reactive. EOMs intact.  EARS: External exam WNL, canals clear. Hearing grossly normal.  NOSE: No deformity or discharge.  MOUTH/THROAT: Lips w/o lesions. RESPIRATORY: Breathing is even, unlabored. Lung sounds are clear   CARDIOVASCULAR: Heart RRR no murmurs, rubs or gallops. No peripheral edema.  GASTROINTESTINAL: Abdomen is soft,  non-tender, not distended w/ normal bowel sounds GENITOURINARY: Bladder non tender, not distended  MUSCULOSKELETAL: No abnormal joints or musculature NEUROLOGIC:  Cranial nerves 2-12 grossly intact. Moves all extremities no tremor. PSYCHIATRIC: pleasantly confused, no  behavioral issues  Patient Active Problem List   Diagnosis Date Noted  . Abdominal pain, unspecified site 07/23/2013  . Odynophagia 07/15/2013  . Thrush, oral 07/14/2013  . Hypernatremia 07/12/2013  . Acute on chronic renal failure 07/12/2013  . Elevated troponin 07/12/2013  . Acute renal failure 07/12/2013  . Hyperkalemia 07/05/2013  . Acute encephalopathy 06/21/2013  . UTI (urinary tract infection) 06/21/2013  . Onychomycosis due to dermatophyte 04/20/2013  . Chest pain 03/01/2013  . Anemia of chronic disease 02/22/2013  . Psychosis 08/20/2012  . Hyponatremia 07/15/2012  . Mouth ulcers 06/15/2012  . Dehydration 06/14/2012  . FTT (failure to thrive) in adult 06/14/2012  . Abnormal urinalysis 06/14/2012  . Agitated 06/14/2012  . Palliative care encounter 06/14/2012  . Hypotension 06/13/2012  . Acute respiratory failure with hypoxia 06/13/2012  . Altered mental state 06/09/2012  . Dementia with behavioral disturbance 04/28/2012  . Constipation 08/26/2010  . Weight loss 08/26/2010  . General medical examination 05/16/2010  . PRURITUS 03/14/2010  . OTHER B-COMPLEX DEFICIENCIES 09/19/2009  . CHOLELITHIASIS 12/13/2008  . AV BLOCK, COMPLETE 05/03/2008  . BRADYCARDIA 05/03/2008  . OSTEOARTHRITIS 10/18/2007  . OSTEOPOROSIS 06/15/2007  . DIZZINESS 03/05/2007  . HYPERLIPIDEMIA 12/18/2006  . History of cardiomyopathy 06/17/2006  . PVD 06/17/2006  . HYPERTENSION 06/12/2006  . ALLERGIC RHINITIS 06/12/2006  . GERD 06/12/2006  . PACEMAKER-[employer] 06/12/2006    CBC    Component Value Date/Time   WBC 10.3 07/13/2013 1300   RBC 4.19 07/13/2013 1300   HGB 12.7 07/13/2013 1300   HCT 37.4 07/13/2013 1300   PLT  206 07/13/2013 1300   MCV 89.3 07/13/2013 1300   LYMPHSABS 1.0 07/12/2013 1029   MONOABS 0.3 07/12/2013 1029   EOSABS 0.4 07/12/2013 1029   BASOSABS 0.0 07/12/2013 1029    CMP     Component Value Date/Time   NA 144 07/15/2013 0403   K 3.9 07/15/2013 0403   CL 113* 07/15/2013 0403   CO2 20 07/15/2013 0403   GLUCOSE 85 07/15/2013 0403   GLUCOSE 83 08/31/2009 1451   BUN 33* 07/15/2013 0403   CREATININE 0.91 07/15/2013 0403   CALCIUM 8.2* 07/15/2013 0403   PROT 7.3 07/12/2013 1029   ALBUMIN 4.0 07/12/2013 1029   AST 28 07/12/2013 1029   ALT 12 07/12/2013 1029   ALKPHOS 72 07/12/2013 1029   BILITOT 0.5 07/12/2013 1029   GFRNONAA 54* 07/15/2013 0403   GFRAA 63* 07/15/2013 0403    Assessment and Plan  Acute on chronic renal failure Likely secondary to prerenal azotemia. Patient looked clinically dry on exam. On admission patient was noted to have a creatinine of 2.79. Patient was also on Bactrim during prior hospitalization for UTI. Urinalysis with small leukocytes 0-2 WBCs. Urine cultures negative. Patient was hydrated with IV fluids renal function improved on a daily basis such that by day of discharge patient's creatinine was down to 0.91. Patient be discharged in stable and improved condition   Acute encephalopathy Patient with worsening confusion likely secondary to dehydration in the setting of dementia versus worsening dementia. Patient noted to have some agitation and combativeness during the hospitalization. Patient's agitation and combativeness improved. Chest x-ray which was done was negative. Urine cultures which were done were negative. CT of the head which was done was negative. Patient improved clinically and was close to baseline by day of discharge   Elevated troponin Cardiac enzymes were drawn on admission patient had one elevated troponin. EKG with a ventricular paced rhythm. Patient with no  chest pain. Cardiology was consulted. Per cardiology likely demand ischemia in the setting of renal  insufficiency. Per cardiology patient is not a candidate for any further ischemic evaluation given renal dysfunction and dementia. Continued on aspirin.    Dehydration Patient was noted to be dehydrated on admission. Patient was hydrated with IV fluids was euvolemic by day of discharge   Hypernatremia Patient was on salt tablets on admission which may have contributed in the setting of dehydration to her hyponatremia. Patient was hydrated with IV fluids with improvement in her hyponatremia and resolution. Salt tablets have been discontinued. Outpatient followup Pt has hx of hyponatremia  Dementia with behavioral disturbance Continued on Namenda. Ativan when necessary   HYPERTENSION Stable  Abdominal pain, unspecified site Patient had some complaints of abdominal pain during the hospitalization. KUB which was done was negative. Lipase levels within normal limits. Patient's abdominal pain resolved.    Odynophagia 2/2 thrush;During the hospitalization patient had some complaints of difficulty swallowing. Secondary to oral thrush. Patient was started on oral Diflucan with some improvement. Patient was seen by speech therapy and placed on a pured diet. Patient tolerated. Diet. She'll be discharged in a pured diet and Diflucan and will need to be evaluated by speech therapy at the facility for advancement of diet     Margit Hanks, MD

## 2013-07-20 ENCOUNTER — Encounter: Payer: Self-pay | Admitting: Internal Medicine

## 2013-07-20 ENCOUNTER — Non-Acute Institutional Stay (SKILLED_NURSING_FACILITY): Payer: Medicare Other | Admitting: Internal Medicine

## 2013-07-20 DIAGNOSIS — G9340 Encephalopathy, unspecified: Secondary | ICD-10-CM

## 2013-07-20 DIAGNOSIS — N390 Urinary tract infection, site not specified: Secondary | ICD-10-CM

## 2013-07-20 DIAGNOSIS — N189 Chronic kidney disease, unspecified: Secondary | ICD-10-CM

## 2013-07-20 DIAGNOSIS — F0391 Unspecified dementia with behavioral disturbance: Secondary | ICD-10-CM

## 2013-07-20 DIAGNOSIS — E871 Hypo-osmolality and hyponatremia: Secondary | ICD-10-CM

## 2013-07-20 DIAGNOSIS — N179 Acute kidney failure, unspecified: Secondary | ICD-10-CM

## 2013-07-20 DIAGNOSIS — F22 Delusional disorders: Secondary | ICD-10-CM

## 2013-07-20 DIAGNOSIS — F03918 Unspecified dementia, unspecified severity, with other behavioral disturbance: Secondary | ICD-10-CM

## 2013-07-20 NOTE — Progress Notes (Signed)
Patient ID: [name], female   DOB: 01-Sep-1922, 80 y.o.   MRN: [id number]  Location:  [facility] Provider:  Gwenith Spitz. Renato Gails, D.O., C.M.D.  Code Status:  DNR   Chief Complaint  Patient presents with  . Acute Visit    remains quite psychotic with bothersome hallucinations and delusions that did not improved despite treatment of acute renal failure at hospital    HPI:  80 yo female with h/o depression, dementia, psychosis, osteoarthritis and constipation here for long term care.  She was just readmitted after a hospitalization for acute mental status where she was treated for a uti and acute renal failure.  Notes from the hospital indicate she improved, but here she remains actively psychotic and has been screaming for hours at a time.  Her roommate was especially disturbed and had not gotten any sleep last night.  Review of Systems:  Review of Systems  Constitutional: Negative for fever.  HENT: Negative for congestion.   Eyes: Negative for blurred vision.  Respiratory: Negative for shortness of breath.   Cardiovascular: Negative for chest pain.  Gastrointestinal: Negative for abdominal pain and constipation.  Genitourinary: Negative for dysuria.  Musculoskeletal: Negative for falls and myalgias.  Neurological: Negative for dizziness and loss of consciousness.  Endo/Heme/Allergies: Bruises/bleeds easily.  Psychiatric/Behavioral: Positive for hallucinations and memory loss. Negative for depression. The patient is nervous/anxious and has insomnia.     Medications: Patient's Medications  New Prescriptions   No medications on file  Previous Medications   ACETAMINOPHEN (TYLENOL) 325 MG TABLET    Take 650 mg by mouth every 12 (twelve) hours.    ACETAMINOPHEN (TYLENOL) 325 MG TABLET    Take 650 mg by mouth every 6 (six) hours as needed for mild pain or fever.   ASPIRIN EC 81 MG TABLET    Take 81 mg by mouth every morning.    BEPOTASTINE BESILATE (BEPREVE OP)    Place  1 drop into both eyes 2 (two) times daily.   CALCIUM-VITAMIN D (OSCAL WITH D) 500-200 MG-UNIT PER TABLET    Take 1 tablet by mouth every morning.    CYANOCOBALAMIN 500 MCG TABLET    Take 1,000 mcg by mouth every morning.    DICLOFENAC SODIUM (VOLTAREN) 1 % GEL    Place 4 g onto the skin 4 (four) times daily. Bilateral knees   FEXOFENADINE (ALLEGRA) 180 MG TABLET    Take 180 mg by mouth every morning.    MEMANTINE HCL ER (NAMENDA XR) 28 MG CP24    Take 28 mg by mouth every morning.    MENTHOL, TOPICAL ANALGESIC, (BIOFREEZE) 4 % GEL    Apply 1 application topically 2 (two) times daily. To arthritic joints   NUTRITIONAL SUPPLEMENTS (NUTRITIONAL DRINK) LIQD    Take 120 mLs by mouth 4 (four) times daily. "2 Cal"   POLYETHYLENE GLYCOL (MIRALAX / GLYCOLAX) PACKET    Take 17 g by mouth every other day.    PROPYLENE GLYCOL 0.6 % SOLN    Place 1 drop into both eyes 3 (three) times daily.   SACCHAROMYCES BOULARDII (FLORASTOR) 250 MG CAPSULE    Take 250 mg by mouth 2 (two) times daily.  Modified Medications   No medications on file  Discontinued Medications   FLUCONAZOLE (DIFLUCAN) 100 MG TABLET    Take 1 tablet (100 mg total) by mouth daily.   LORAZEPAM (ATIVAN) 0.5 MG TABLET    Take 1 tablet (0.5 mg total) by mouth every 12 (  twelve) hours as needed (agitation).   LORAZEPAM (ATIVAN) 2 MG/ML CONCENTRATED SOLUTION    Inject 0.5 mg into the muscle once as needed (agitation).   ONDANSETRON (ZOFRAN) 4 MG TABLET    Take 1 tablet (4 mg total) by mouth every 6 (six) hours as needed for nausea.   OVER THE COUNTER MEDICATION    Take 1 scoop by mouth daily. Protein powder. 1 scoop mixed with juice of choice at 1400 every day.    Physical Exam: Filed Vitals:   07/20/13 1609  BP: 138/89  Pulse: 74  Temp: 97.2 F (36.2 C)  Resp: 18  Height: 5\' 4"  (1.626 m)  Weight: 168 lb (76.204 kg)  SpO2: 98%  Physical Exam  Constitutional: She appears well-developed and well-nourished.  Cardiovascular: Normal rate,  regular rhythm, normal heart sounds and intact distal pulses.   Pulmonary/Chest: Effort normal and breath sounds normal. No respiratory distress.  Abdominal: Soft. Bowel sounds are normal. She exhibits no distension and no mass. There is no tenderness.  Musculoskeletal: Normal range of motion. She exhibits no edema and no tenderness.  Neurological: She is alert.  Skin: Skin is warm and dry.  Psychiatric:  Pleasantly confused when seen, but actively having paranoid delusions    Labs reviewed: Basic Metabolic Panel:  Recent Labs  16/10/96 0215  07/13/13 1300 07/14/13 1024 07/15/13 0403  NA 132*  < > 144 146 144  K 5.1  < > 4.1 3.9 3.9  CL 98  < > 110 113* 113*  CO2 18*  < > 22 19 20   GLUCOSE 126*  < > 103* 86 85  BUN 27*  < > 73* 47* 33*  CREATININE 1.29*  < > 1.60* 1.05 0.91  CALCIUM 9.5  < > 9.2 8.9 8.2*  MG 2.1  --  2.6*  --   --   PHOS 3.7  --   --   --   --   < > = values in this interval not displayed.  Liver Function Tests:  Recent Labs  06/20/13 2005 06/21/13 0215 07/12/13 1029  AST 26 25 28   ALT 12 12 12   ALKPHOS 93 89 72  BILITOT 0.2* 0.3 0.5  PROT 8.2 7.9 7.3  ALBUMIN 4.4 4.2 4.0    CBC:  Recent Labs  03/01/13 1650  06/21/13 0215 07/12/13 1029 07/13/13 1300  WBC 7.5  < > 9.4 7.1 10.3  NEUTROABS 3.6  --   --  5.4  --   HGB 12.6  < > 12.4 13.0 12.7  HCT 37.6  < > 36.5 38.9 37.4  MCV 89.5  < > 87.5 89.4 89.3  PLT 215  < > 239 239 206  < > = values in this interval not displayed.   Assessment/Plan 1. Acute on chronic renal failure -resolved during hospitalization -recheck bmp  2. Dementia with behavioral disturbance -has gotten worse recently with more delusions and psychosis  3. Acute encephalopathy -her behaviors and psychosis did get worse prior to hospital admission and I suspect this was primarily due to acute renal failure   4. Urinary tract infection without hematuria, site unspecified -was treated here and resolved, but psychosis  persists and is longstanding  5. Hyponatremia -f/u bmp  6. Delusional disorder -has longstanding psychosis with delusions and grandiose religious thoughts -will start on seroquel 25mg  at bedtime and titrate up as needed for this  Family/ staff Communication: discussed with nursing staff  Goals of care: long term care resident, DNR  Labs/tests  ordered:  Cbc, bmp next draw

## 2013-07-23 ENCOUNTER — Encounter: Payer: Self-pay | Admitting: Internal Medicine

## 2013-07-23 DIAGNOSIS — R109 Unspecified abdominal pain: Secondary | ICD-10-CM | POA: Insufficient documentation

## 2013-07-23 NOTE — Assessment & Plan Note (Signed)
Stable

## 2013-07-23 NOTE — Assessment & Plan Note (Signed)
Patient with worsening confusion likely secondary to dehydration in the setting of dementia versus worsening dementia. Patient noted to have some agitation and combativeness during the hospitalization. Patient's agitation and combativeness improved. Chest x-ray which was done was negative. Urine cultures which were done were negative. CT of the head which was done was negative. Patient improved clinically and was close to baseline by day of discharge

## 2013-07-23 NOTE — Assessment & Plan Note (Signed)
Patient was on salt tablets on admission which may have contributed in the setting of dehydration to her hyponatremia. Patient was hydrated with IV fluids with improvement in her hyponatremia and resolution. Salt tablets have been discontinued. Outpatient followup Pt has hx of hyponatremia

## 2013-07-23 NOTE — Assessment & Plan Note (Signed)
Likely secondary to prerenal azotemia. Patient looked clinically dry on exam. On admission patient was noted to have a creatinine of 2.79. Patient was also on Bactrim during prior hospitalization for UTI. Urinalysis with small leukocytes 0-2 WBCs. Urine cultures negative. Patient was hydrated with IV fluids renal function improved on a daily basis such that by day of discharge patient's creatinine was down to 0.91. Patient be discharged in stable and improved condition

## 2013-07-23 NOTE — Assessment & Plan Note (Signed)
2/2 thrush;During the hospitalization patient had some complaints of difficulty swallowing. Secondary to oral thrush. Patient was started on oral Diflucan with some improvement. Patient was seen by speech therapy and placed on a pured diet. Patient tolerated. Diet. She'll be discharged in a pured diet and Diflucan and will need to be evaluated by speech therapy at the facility for advancement of diet

## 2013-07-23 NOTE — Assessment & Plan Note (Signed)
Continued on Namenda. Ativan when necessary

## 2013-07-23 NOTE — Assessment & Plan Note (Signed)
Patient was noted to be dehydrated on admission. Patient was hydrated with IV fluids was euvolemic by day of discharge

## 2013-07-23 NOTE — Assessment & Plan Note (Signed)
Cardiac enzymes were drawn on admission patient had one elevated troponin. EKG with a ventricular paced rhythm. Patient with no chest pain. Cardiology was consulted. Per cardiology likely demand ischemia in the setting of renal insufficiency. Per cardiology patient is not a candidate for any further ischemic evaluation given renal dysfunction and dementia. Continued on aspirin.

## 2013-07-23 NOTE — Assessment & Plan Note (Signed)
Patient had some complaints of abdominal pain during the hospitalization. KUB which was done was negative. Lipase levels within normal limits. Patient's abdominal pain resolved.

## 2013-07-25 ENCOUNTER — Other Ambulatory Visit: Payer: Self-pay | Admitting: *Deleted

## 2013-07-25 MED ORDER — LORAZEPAM 2 MG/ML IJ SOLN: INTRAMUSCULAR | Status: DC

## 2013-07-25 NOTE — Telephone Encounter (Signed)
[facility] [place] 

## 2013-08-09 LAB — URINE CULTURE

## 2013-08-22 IMAGING — CR DG CHEST 2V
1 series · 1 of 1 positions shown · non-contrast
Comparison: 08/21/2010 [HOSPITAL] chest x-ray.  08/30/2010
[HOSPITAL] CT.

CLINICAL DATA: Syncopal episode.

CHEST - 2 VIEW

[w chest lat]
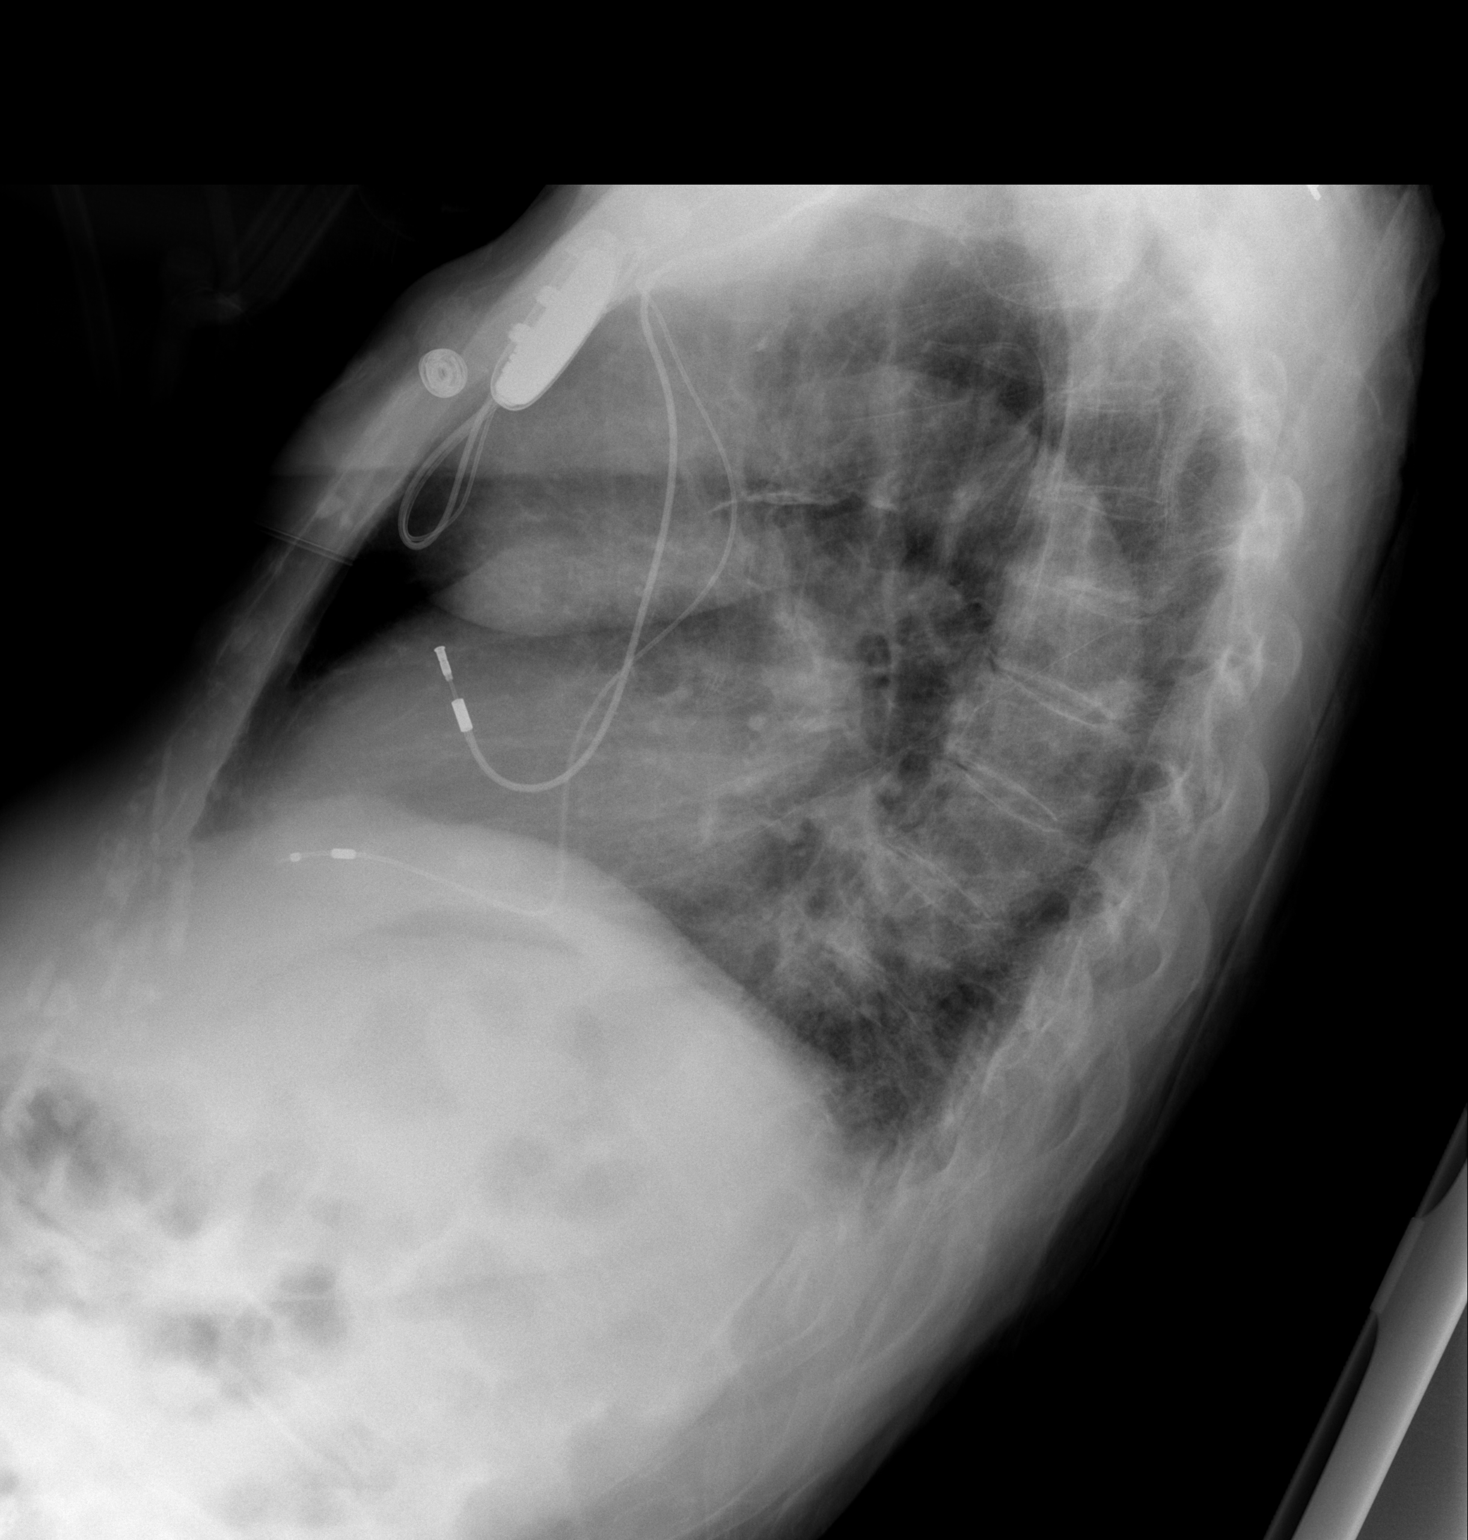

[1 of 1 positions shown; findings below may reference images not displayed]

FINDINGS: Sequential pacemaker is in place with leads unchanged in
position.

Heart size top normal.  Calcified tortuous aorta.

No infiltrate, congestive heart failure or pneumothorax.

Shoulder joint degenerative changes.
IMPRESSION: No acute abnormality.  Please see above.

## 2013-08-22 IMAGING — CT CT HEAD W/O CM
1 of 2 series · 16 of 30 positions shown, 20 images · non-contrast
Comparison: 03/22/2007 [REDACTED] CT.

CLINICAL DATA: Syncopal episode.  Weakness.

CT HEAD WITHOUT CONTRAST
TECHNIQUE: Contiguous axial images were obtained from the base of
the skull through the vertex without contrast.

[Series 3: recon 2: brain · axial · 0.47mm/px · z∈[+143,+283]mm · 16 of 64 slices shown, 20 images]
[im 4/64  brain]
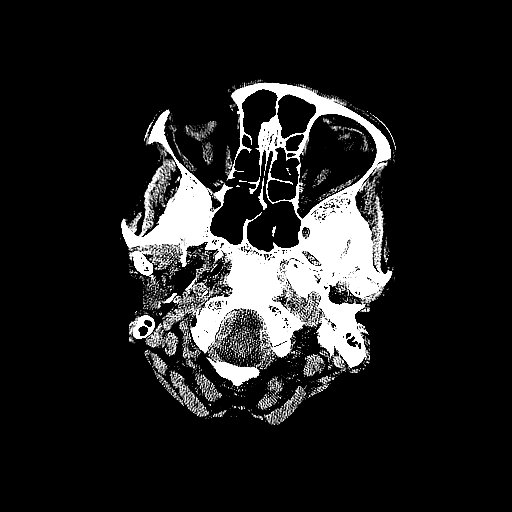
[im 4/64  bone]
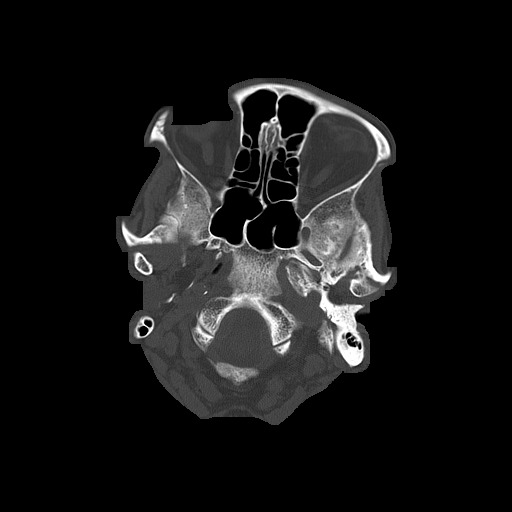
[im 7/64  brain]
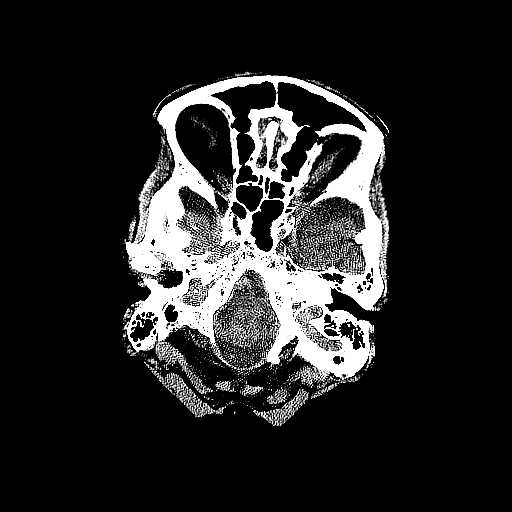
[im 10/64  brain]
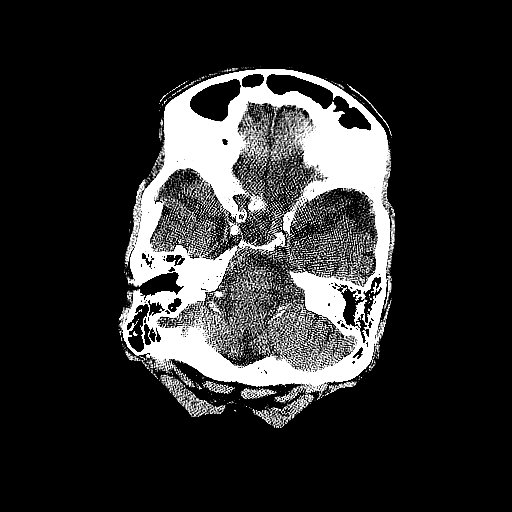
[im 14/64  brain]
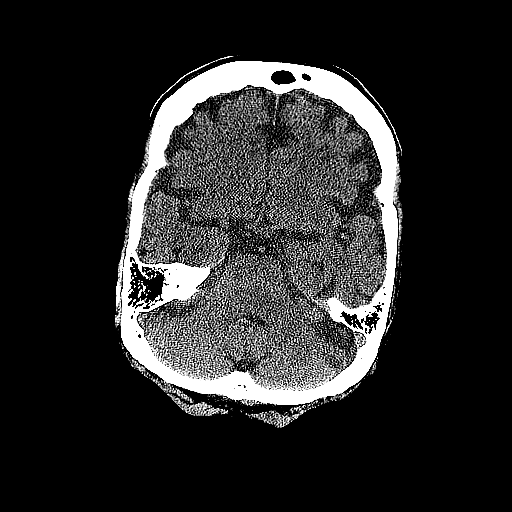
[im 20/64  brain]
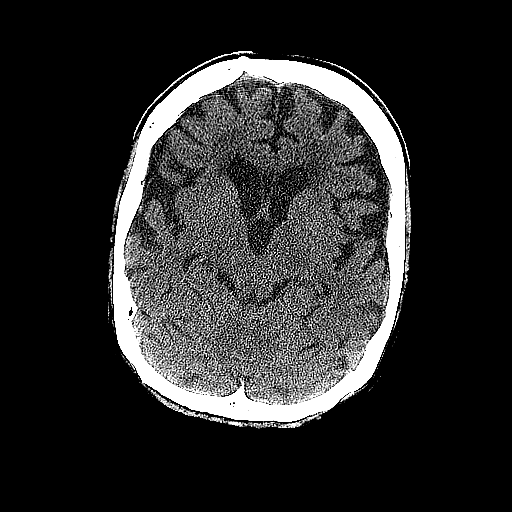
[im 20/64  bone]
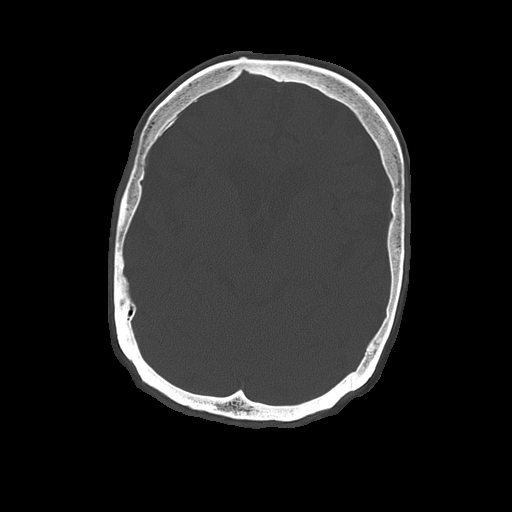
[im 24/64  brain]
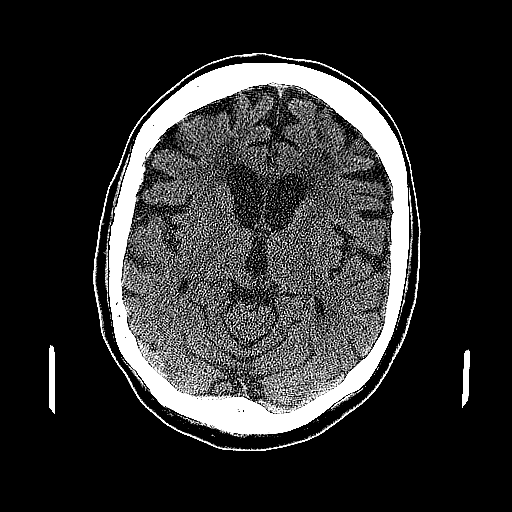
[im 27/64  brain]
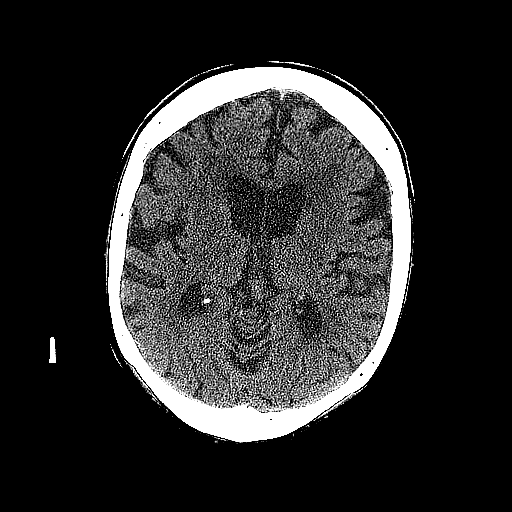
[im 30/64  brain]
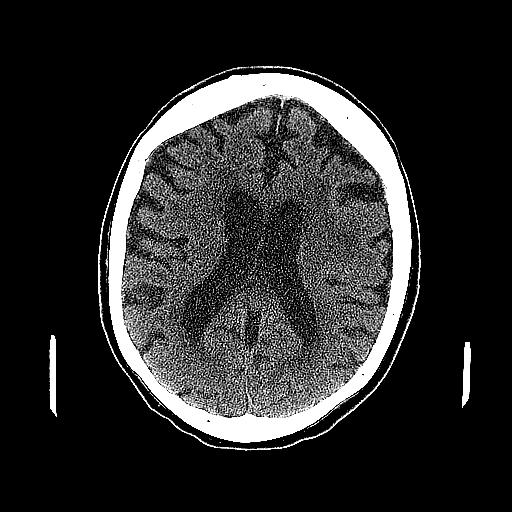
[im 34/64  brain]
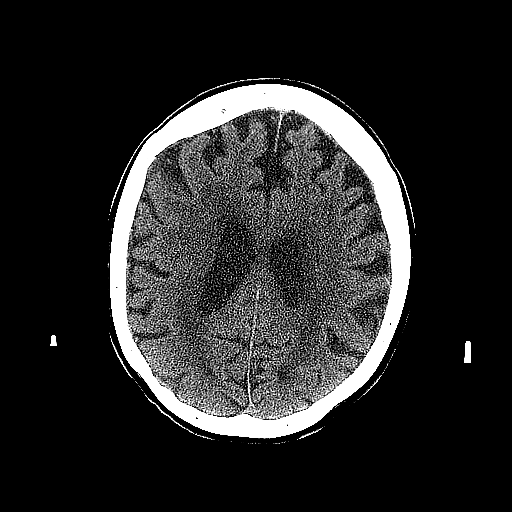
[im 34/64  bone]
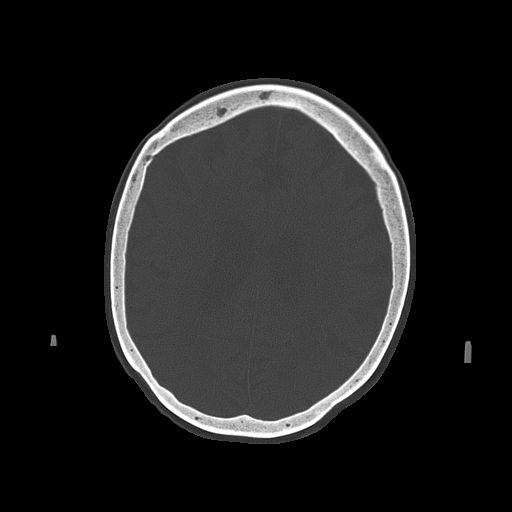
[im 37/64  brain]
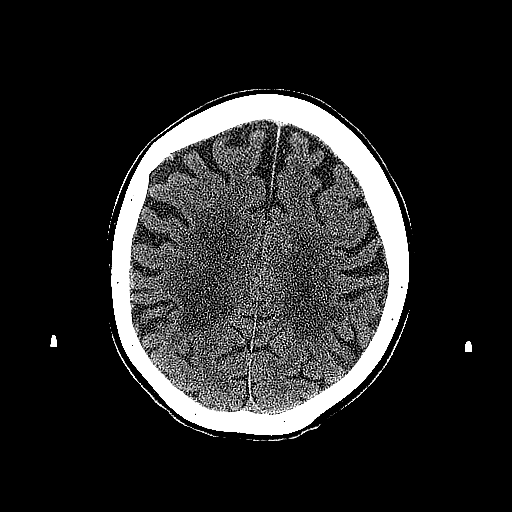
[im 40/64  brain]
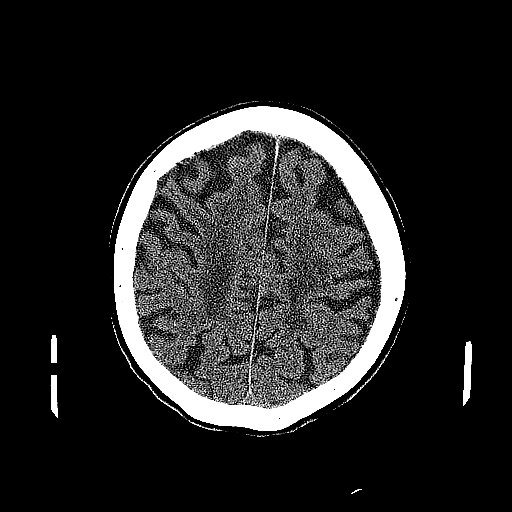
[im 44/64  brain]
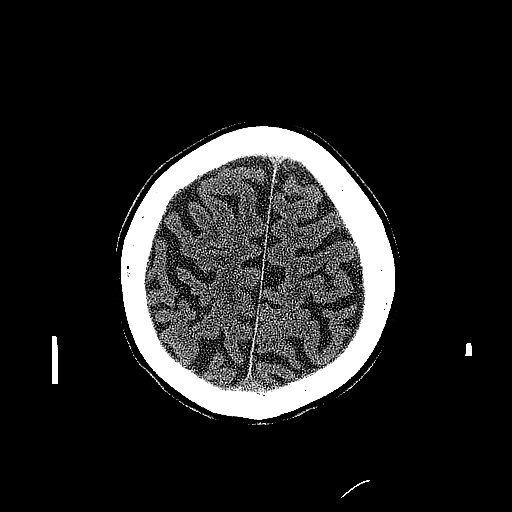
[im 50/64  brain]
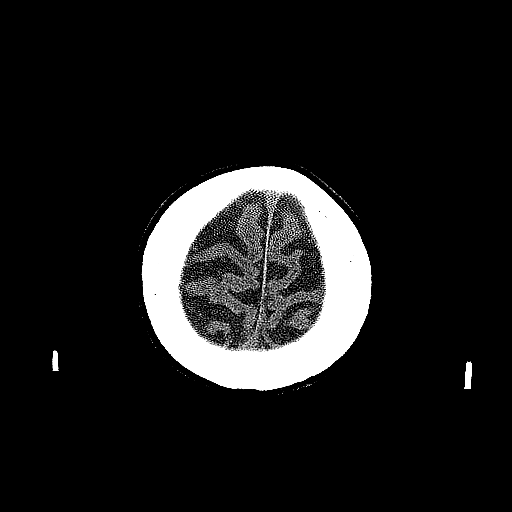
[im 50/64  bone]
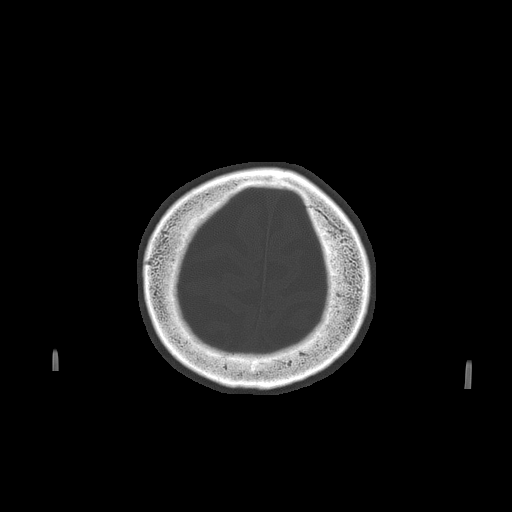
[im 54/64  brain]
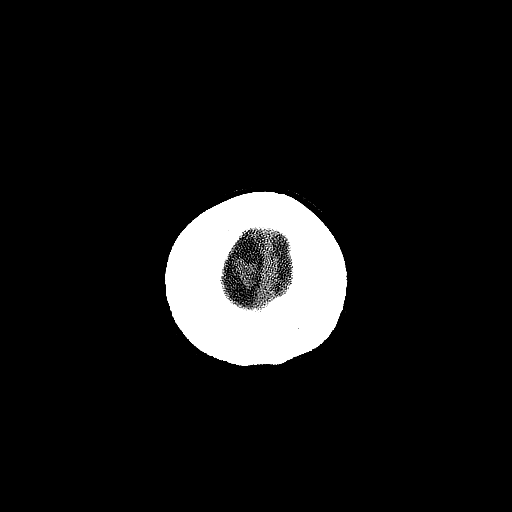
[im 57/64  brain]
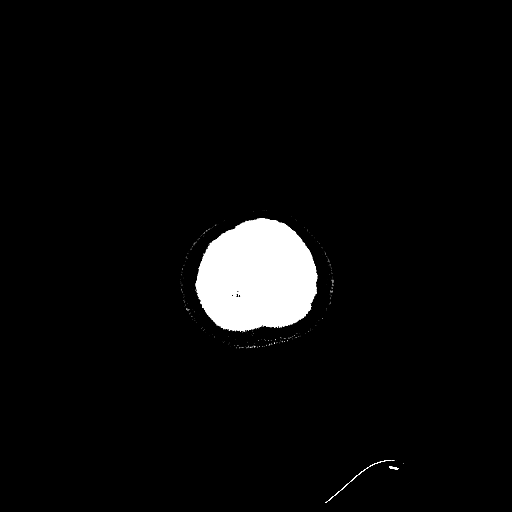
[im 60/64  brain]
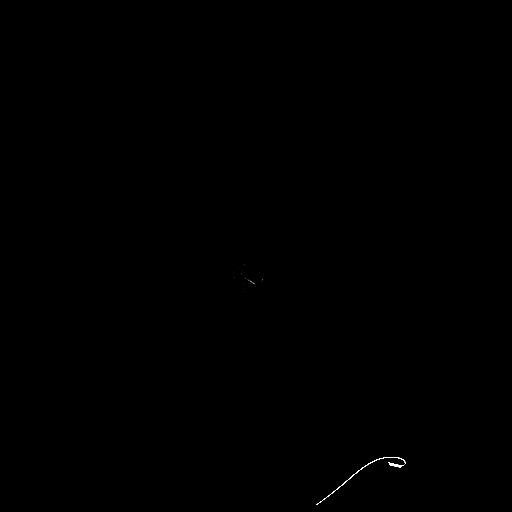

[16 of 30 positions shown; findings below may reference images not displayed]

FINDINGS: No intracranial hemorrhage.

No CT evidence of large acute infarct.  Small acute infarct cannot
be excluded by CT.

Prominent small vessel disease type changes.

Global age related atrophy without hydrocephalus.

Vascular calcifications.

No intracranial mass lesion detected on this unenhanced exam.

Visualized sinuses and mastoid air cells are clear.
IMPRESSION: No intracranial hemorrhage or CT evidence of large acute infarct.

Prominent small vessel disease type changes.

Global atrophy without hydrocephalus.

## 2013-09-14 ENCOUNTER — Encounter: Payer: Self-pay | Admitting: Internal Medicine

## 2013-09-14 ENCOUNTER — Non-Acute Institutional Stay (SKILLED_NURSING_FACILITY): Payer: Medicare Other | Admitting: Internal Medicine

## 2013-09-14 DIAGNOSIS — F0391 Unspecified dementia with behavioral disturbance: Secondary | ICD-10-CM

## 2013-09-14 DIAGNOSIS — F03918 Unspecified dementia, unspecified severity, with other behavioral disturbance: Secondary | ICD-10-CM

## 2013-09-14 DIAGNOSIS — I10 Essential (primary) hypertension: Secondary | ICD-10-CM

## 2013-09-14 DIAGNOSIS — R05 Cough: Secondary | ICD-10-CM

## 2013-09-14 DIAGNOSIS — R059 Cough, unspecified: Secondary | ICD-10-CM

## 2013-09-14 NOTE — Progress Notes (Signed)
Patient ID: [name], female   DOB: March 12, 1922, 80 y.o.   MRN: [id number]  Location:  [facility]  Provider:  Gwenith Spitz. Renato Gails, D.O., C.M.D.  Code Status:  DNR  Chief Complaint  Patient presents with  . Medical Management of Chronic Issues    HPI:  80 yo long term care resident  Review of Systems:  Review of Systems  Constitutional: Positive for weight loss and malaise/fatigue. Negative for fever and chills.  HENT: Negative for congestion.   Eyes: Negative for blurred vision.  Respiratory: Positive for cough. Negative for shortness of breath.   Cardiovascular: Positive for leg swelling. Negative for chest pain.  Gastrointestinal: Negative for abdominal pain.  Genitourinary: Negative for dysuria.  Musculoskeletal: Negative for falls.  Skin: Negative for rash.  Neurological: Positive for weakness. Negative for dizziness.  Endo/Heme/Allergies: Bruises/bleeds easily.  Psychiatric/Behavioral: Positive for hallucinations and memory loss.    Medications: Patient's Medications  New Prescriptions   No medications on file  Previous Medications   ACETAMINOPHEN (TYLENOL) 325 MG TABLET    Take 650 mg by mouth every 12 (twelve) hours.    ACETAMINOPHEN (TYLENOL) 325 MG TABLET    Take 650 mg by mouth every 6 (six) hours as needed for mild pain or fever.   ASPIRIN EC 81 MG TABLET    Take 81 mg by mouth every morning.    BEPOTASTINE BESILATE (BEPREVE OP)    Place 1 drop into both eyes 2 (two) times daily.   CALCIUM-VITAMIN D (OSCAL WITH D) 500-200 MG-UNIT PER TABLET    Take 1 tablet by mouth every morning.    CYANOCOBALAMIN 500 MCG TABLET    Take 1,000 mcg by mouth every morning.    DICLOFENAC SODIUM (VOLTAREN) 1 % GEL    Place 4 g onto the skin 4 (four) times daily. Bilateral knees   DIVALPROEX (DEPAKOTE SPRINKLE) 125 MG CAPSULE    Take 125 mg by mouth at bedtime.   FEXOFENADINE (ALLEGRA) 180 MG TABLET    Take 180 mg by mouth every morning.    LORAZEPAM (ATIVAN) 0.5 MG  TABLET    Take 0.5 mg by mouth every 12 (twelve) hours as needed for anxiety.   MEMANTINE HCL ER (NAMENDA XR) 28 MG CP24    Take 28 mg by mouth every morning.    NUTRITIONAL SUPPLEMENTS (NUTRITIONAL DRINK) LIQD    Take 120 mLs by mouth 4 (four) times daily. "2 Cal"   ONDANSETRON (ZOFRAN) 4 MG TABLET    Take 4 mg by mouth every 6 (six) hours as needed for nausea or vomiting.   POLYETHYLENE GLYCOL (MIRALAX / GLYCOLAX) PACKET    Take 17 g by mouth every other day.    PROPYLENE GLYCOL 0.6 % SOLN    Place 1 drop into both eyes 3 (three) times daily.  Modified Medications   No medications on file  Discontinued Medications   LORAZEPAM (ATIVAN) 2 MG/ML CONCENTRATED SOLUTION    Inject 0.5 mg into the muscle once as needed for anxiety.   LORAZEPAM (ATIVAN) 2 MG/ML INJECTION    Inject 0.59ml (1mg ) intramuscularly every 6 hours as needed for anxiety   LORAZEPAM (ATIVAN) 2 MG/ML INJECTION    Inject 0.5 mg into the muscle every 12 (twelve) hours as needed.   MENTHOL, TOPICAL ANALGESIC, (BIOFREEZE) 4 % GEL    Apply 1 application topically 2 (two) times daily. To arthritic joints   SACCHAROMYCES BOULARDII (FLORASTOR) 250 MG CAPSULE    Take 250 mg  by mouth 2 (two) times daily.    Physical Exam: Filed Vitals:   09/14/13 1619  BP: 132/72  Pulse: 64  Temp: 97.5 F (36.4 C)  Resp: 18  Height:  (1.626 m)  Weight: 173 lb (78.472 kg)  SpO2: 98%  Physical Exam  Constitutional: She is oriented to person, place, and time. She appears well-developed and well-nourished. No distress.  Cardiovascular: Normal rate, regular rhythm, normal heart sounds and intact distal pulses.   Pulmonary/Chest: Effort normal and breath sounds normal. No respiratory distress.  Abdominal: Soft. Bowel sounds are normal. She exhibits no distension and no mass. There is no tenderness.  Musculoskeletal: Normal range of motion.  Neurological: She is alert and oriented to person, place, and time.  Skin: Skin is warm and dry.    Psychiatric: She has a normal mood and affect.    Labs reviewed: Basic Metabolic Panel:  Recent Labs  16/10/96 0215  07/13/13 1300 07/14/13 1024 07/15/13 0403  NA 132*  < > 144 146 144  K 5.1  < > 4.1 3.9 3.9  CL 98  < > 110 113* 113*  CO2 18*  < > GLUCOSE 126*  < > 103* 86 85  BUN 27*  < > 73* 47* 33*  CREATININE 1.29*  < > 1.60* 1.05 0.91  CALCIUM 9.5  < > 9.2 8.9 8.2*  MG 2.1  --  2.6*  --   --   PHOS 3.7  --   --   --   --   < > = values in this interval not displayed.  Liver Function Tests:  Recent Labs  06/20/13 2005 06/21/13 0215 07/12/13 1029  AST ALT ALKPHOS 93 89 72  BILITOT 0.2* 0.3 0.5  PROT 8.2 7.9 7.3  ALBUMIN 4.4 4.2 4.0    CBC:  Recent Labs  03/01/13 1650  06/21/13 0215 07/12/13 1029 07/13/13 1300  WBC 7.5  < > 9.4 7.1 10.3  NEUTROABS 3.6  --   --  5.4  --   HGB 12.6  < > 12.4 13.0 12.7  HCT 37.6  < > 36.5 38.9 37.4  MCV 89.5  < > 87.5 89.4 89.3  PLT 215  < > 239 239 206  < > = values in this interval not displayed.  Assessment/Plan 1. Cough -acute complaint when seen -will check bmp and CXR  2. Dementia with behavioral disturbance -cont depakote, namenda XR for this -monitor depakote levels  3. Essential hypertension, benign -bp at goal, not on meds  Family/ staff Communication: discussed with nurse Goals of care: DNR, long term care, comfort measures Labs/tests ordered:  CXR, bmp next draw

## 2013-10-17 ENCOUNTER — Emergency Department (HOSPITAL_COMMUNITY)
Admission: EM | Admit: 2013-10-17 | Discharge: 2013-10-17 | Disposition: A | Discharge status: Other Acute Inpatient Hospital | Payer: Medicare Other | Attending: Emergency Medicine | Admitting: Emergency Medicine

## 2013-10-17 ENCOUNTER — Emergency Department (HOSPITAL_COMMUNITY): Payer: Medicare Other

## 2013-10-17 ENCOUNTER — Encounter (HOSPITAL_COMMUNITY): Payer: Self-pay | Admitting: Emergency Medicine

## 2013-10-17 DIAGNOSIS — I639 Cerebral infarction, unspecified: Secondary | ICD-10-CM

## 2013-10-17 DIAGNOSIS — M810 Age-related osteoporosis without current pathological fracture: Secondary | ICD-10-CM | POA: Insufficient documentation

## 2013-10-17 DIAGNOSIS — Z79899 Other long term (current) drug therapy: Secondary | ICD-10-CM | POA: Diagnosis not present

## 2013-10-17 DIAGNOSIS — Z87891 Personal history of nicotine dependence: Secondary | ICD-10-CM | POA: Insufficient documentation

## 2013-10-17 DIAGNOSIS — Z880 Allergy status to penicillin: Secondary | ICD-10-CM | POA: Insufficient documentation

## 2013-10-17 DIAGNOSIS — R29810 Facial weakness: Secondary | ICD-10-CM | POA: Diagnosis not present

## 2013-10-17 DIAGNOSIS — R4781 Slurred speech: Secondary | ICD-10-CM | POA: Insufficient documentation

## 2013-10-17 DIAGNOSIS — Z66 Do not resuscitate: Secondary | ICD-10-CM | POA: Insufficient documentation

## 2013-10-17 DIAGNOSIS — Z791 Long term (current) use of non-steroidal anti-inflammatories (NSAID): Secondary | ICD-10-CM | POA: Diagnosis not present

## 2013-10-17 DIAGNOSIS — K219 Gastro-esophageal reflux disease without esophagitis: Secondary | ICD-10-CM | POA: Insufficient documentation

## 2013-10-17 DIAGNOSIS — Z950 Presence of cardiac pacemaker: Secondary | ICD-10-CM | POA: Insufficient documentation

## 2013-10-17 DIAGNOSIS — M1990 Unspecified osteoarthritis, unspecified site: Secondary | ICD-10-CM | POA: Insufficient documentation

## 2013-10-17 DIAGNOSIS — Z7982 Long term (current) use of aspirin: Secondary | ICD-10-CM | POA: Insufficient documentation

## 2013-10-17 DIAGNOSIS — I10 Essential (primary) hypertension: Secondary | ICD-10-CM | POA: Insufficient documentation

## 2013-10-17 DIAGNOSIS — F0390 Unspecified dementia without behavioral disturbance: Secondary | ICD-10-CM | POA: Diagnosis not present

## 2013-10-17 DIAGNOSIS — G9340 Encephalopathy, unspecified: Secondary | ICD-10-CM

## 2013-10-17 DIAGNOSIS — D649 Anemia, unspecified: Secondary | ICD-10-CM | POA: Diagnosis not present

## 2013-10-17 DIAGNOSIS — Z9181 History of falling: Secondary | ICD-10-CM | POA: Insufficient documentation

## 2013-10-17 LAB — DIFFERENTIAL
Basophils Absolute: 0.1 10*3/uL (ref 0.0–0.1)
Basophils Relative: 1 % (ref 0–1)
Eosinophils Absolute: 0.5 10*3/uL (ref 0.0–0.7)
Eosinophils Relative: 9 % — ABNORMAL HIGH (ref 0–5)
LYMPHS PCT: 30 % (ref 12–46)
Lymphs Abs: 1.8 10*3/uL (ref 0.7–4.0)
MONOS PCT: 13 % — AB (ref 3–12)
Monocytes Absolute: 0.8 10*3/uL (ref 0.1–1.0)
NEUTROS PCT: 47 % (ref 43–77)
Neutro Abs: 2.9 10*3/uL (ref 1.7–7.7)

## 2013-10-17 LAB — COMPREHENSIVE METABOLIC PANEL
ALT: 8 U/L (ref 0–35)
AST: 21 U/L (ref 0–37)
Albumin: 3.7 g/dL (ref 3.5–5.2)
Alkaline Phosphatase: 81 U/L (ref 39–117)
Anion gap: 13 (ref 5–15)
BILIRUBIN TOTAL: 0.4 mg/dL (ref 0.3–1.2)
BUN: 34 mg/dL — AB (ref 6–23)
CHLORIDE: 91 meq/L — AB (ref 96–112)
CO2: 23 meq/L (ref 19–32)
Calcium: 9.2 mg/dL (ref 8.4–10.5)
Creatinine, Ser: 1.25 mg/dL — ABNORMAL HIGH (ref 0.50–1.10)
GFR calc Af Amer: 43 mL/min — ABNORMAL LOW (ref >90)
GFR calc non Af Amer: 37 mL/min — ABNORMAL LOW (ref >90)
GLUCOSE: 86 mg/dL (ref 70–99)
Potassium: 5 mEq/L (ref 3.7–5.3)
SODIUM: 127 meq/L — AB (ref 137–147)
Total Protein: 7.7 g/dL (ref 6.0–8.3)

## 2013-10-17 LAB — PROTIME-INR
INR: 1.17 (ref 0.00–1.49)
PROTHROMBIN TIME: 14.9 s (ref 11.6–15.2)

## 2013-10-17 LAB — APTT: aPTT: 32 seconds (ref 24–37)

## 2013-10-17 LAB — I-STAT CHEM 8, ED
BUN: 35 mg/dL — ABNORMAL HIGH (ref 6–23)
CREATININE: 1.3 mg/dL — AB (ref 0.50–1.10)
Calcium, Ion: 1.12 mmol/L — ABNORMAL LOW (ref 1.13–1.30)
Chloride: 97 mEq/L (ref 96–112)
GLUCOSE: 91 mg/dL (ref 70–99)
HEMATOCRIT: 38 % (ref 36.0–46.0)
HEMOGLOBIN: 12.9 g/dL (ref 12.0–15.0)
POTASSIUM: 4.7 meq/L (ref 3.7–5.3)
Sodium: 124 mEq/L — ABNORMAL LOW (ref 137–147)
TCO2: 25 mmol/L (ref 0–100)

## 2013-10-17 LAB — CBC
HCT: 33.4 % — ABNORMAL LOW (ref 36.0–46.0)
Hemoglobin: 11.6 g/dL — ABNORMAL LOW (ref 12.0–15.0)
MCH: 31.7 pg (ref 26.0–34.0)
MCHC: 34.7 g/dL (ref 30.0–36.0)
MCV: 91.3 fL (ref 78.0–100.0)
PLATELETS: 199 10*3/uL (ref 150–400)
RBC: 3.66 MIL/uL — ABNORMAL LOW (ref 3.87–5.11)
RDW: 12.9 % (ref 11.5–15.5)
WBC: 6.1 10*3/uL (ref 4.0–10.5)

## 2013-10-17 LAB — I-STAT TROPONIN, ED: Troponin i, poc: 0.03 ng/mL (ref 0.00–0.08)

## 2013-10-17 LAB — ETHANOL: Alcohol, Ethyl (B): <11 mg/dL (ref 0–11)

## 2013-10-17 NOTE — Consult Note (Signed)
Referring Physician: Horton    Chief Complaint: Code stroke  HPI:                                                                                                                                         [name] is an 80 y.o. female who was found to be not at baseline this AM by nursing staff. Initially it was thought she may have been baseline at 0830 but after talking to patient she states she awoke with symptoms which would have placed her last seen normal prior to bed last night. Patient was found with right facial droop, difficulty swallowing medication and dysarthria. Patients currently has a MOST form stating DNR/DNI, no ABX.  Currently she is awake and alert.    Date last known well: Date: 10/16/2013 Time last known well: Unable to determine tPA Given: No: out of window NIHSS 9  Past Medical History  Diagnosis Date  . Hypertension   . Hyperlipidemia   . Cardiomyopathy     a. EF 45% by nuc 2002. b. EF normal 2014.  Marland Kitchen Complete heart block     a. s/p pacemaker 2001, last gen change [employer] in 2008.  Marland Kitchen PVD (peripheral vascular disease) 2007    a. s/p stenting bilateral legs 2007,   . Allergic rhinitis   . GERD (gastroesophageal reflux disease)   . Osteoporosis   . Osteoarthritis   . Gallstones     per [place] 11-2008, also no AAA  . 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 Dementia   . Multiple falls   . Anemia of chronic disease   . Hyponatremia   . Failure to thrive in adult   . Hyperkalemia     Past Surgical History  Procedure Laterality Date  . Pacemaker insertion      permanent   . Breast biopsy benign      Family History  Problem Relation Age of Onset  . Hypertension    . Breast cancer Neg Hx   . Colon cancer Neg Hx    Social History:  reports that she has quit smoking. She has never used smokeless tobacco. She reports that she does not drink alcohol or use illicit drugs.  Allergies:  Allergies  Allergen Reactions  .  Amlodipine Other (See Comments)    Dizziness & elevated BP  . Hydrocodone Itching  . Penicillins Other (See Comments)    Per MAR  . Vicodin [Hydrocodone-Acetaminophen] Other (See Comments)    Per MAR    Medications:  No current facility-administered medications for this encounter.   Current Outpatient Prescriptions  Medication Sig Dispense Refill  . acetaminophen (TYLENOL) 325 MG tablet Take 650 mg by mouth every 12 (twelve) hours.       Marland Kitchen. acetaminophen (TYLENOL) 325 MG tablet Take 650 mg by mouth every 6 (six) hours as needed for mild pain or fever.      Marland Kitchen. aspirin EC 81 MG tablet Take 81 mg by mouth every morning.       . Bepotastine Besilate (BEPREVE OP) Place 1 drop into both eyes 2 (two) times daily.      . calcium-vitamin D (OSCAL WITH D) 500-200 MG-UNIT per tablet Take 1 tablet by mouth every morning.       . cyanocobalamin 500 MCG tablet Take 1,000 mcg by mouth every morning.       . diclofenac sodium (VOLTAREN) 1 % GEL Place 4 g onto the skin 4 (four) times daily. Bilateral knees      . divalproex (DEPAKOTE SPRINKLE) 125 MG capsule Take 125 mg by mouth at bedtime.      . fexofenadine (ALLEGRA) 180 MG tablet Take 180 mg by mouth every morning.       Marland Kitchen. LORazepam (ATIVAN) 0.5 MG tablet Take 0.5 mg by mouth every 12 (twelve) hours as needed for anxiety.      . Memantine HCl ER (NAMENDA XR) 28 MG CP24 Take 28 mg by mouth every morning.       . Nutritional Supplements (NUTRITIONAL DRINK) LIQD Take 120 mLs by mouth 4 (four) times daily. "2 Cal"      . ondansetron (ZOFRAN) 4 MG tablet Take 4 mg by mouth every 6 (six) hours as needed for nausea or vomiting.      . polyethylene glycol (MIRALAX / GLYCOLAX) packet Take 17 g by mouth every other day.       Marland Kitchen. Propylene Glycol 0.6 % SOLN Place 1 drop into both eyes 3 (three) times daily.         ROS:                                                                                                                                        History obtained from the patient and and family  General ROS: negative for - chills, fatigue, fever, night sweats, weight gain or weight loss Psychological ROS: negative for - behavioral disorder, hallucinations, memory difficulties, mood swings or suicidal ideation Ophthalmic ROS: negative for - blurry vision, double vision, eye pain or loss of vision ENT ROS: negative for - epistaxis, nasal discharge, oral lesions, sore throat, tinnitus or vertigo Allergy and Immunology ROS: negative for - hives or itchy/watery eyes Hematological and Lymphatic ROS: negative for - bleeding problems, bruising or swollen lymph nodes Endocrine ROS: negative for - galactorrhea, hair pattern changes, polydipsia/polyuria or temperature intolerance Respiratory ROS: negative for - cough, hemoptysis, shortness of breath  or wheezing Cardiovascular ROS: negative for - chest pain, dyspnea on exertion, edema or irregular heartbeat Gastrointestinal ROS: negative for - abdominal pain, diarrhea, hematemesis, nausea/vomiting or stool incontinence Genito-Urinary ROS: negative for - dysuria, hematuria, incontinence or urinary frequency/urgency Musculoskeletal ROS: negative for - joint swelling or muscular weakness Neurological ROS: as noted in HPI Dermatological ROS: negative for rash and skin lesion changes  Neurologic Examination:                                                                                                      Blood pressure 161/68, pulse 78, temperature 97.6 F (36.4 C), temperature source Oral, resp. rate 19, height 5\' 3"  (1.6 m), weight 79.379 kg (175 lb), SpO2 100.00%.  General: NAD Mental Status: Alert, oriented.  Speech dysarthric without evidence of aphasia.  Able to follow simple commands without difficulty. Cranial Nerves: II: Discs difficult to see bilaterally; Visual fields grossly  normal, pupils equal, round, reactive to light and accommodation III,IV, VI: ptosis not presents, she does hav edysconjugate gaze at rest, but is able to conjugate with effort.  V,VII: smile asymmetric on the right, facial light touch sensation normal bilaterally VIII: hearing normal bilaterally IX,X: gag reflex present XI: bilateral shoulder shrug XII: midline tongue extension without atrophy or fasciculations  Motor: Right : Upper extremity   3/5 (pain)    Left:     Upper extremity   4/5  Unable to lift bilateral lags antigravity which is baseline(wheel chair bound at baseline) Tone and bulk:normal tone throughout; no atrophy noted Sensory: Pinprick and light touch intact throughout, bilaterally Deep Tendon Reflexes:  2+ bilateral UE with no AJ or KJ  Plantars: Down going bilaterally Cerebellar: normal finger-to-nose on the left--unable to participate on the right due to pain,  Unable to obtain heel-to-shin test due to baseline wweakness Gait: not tested CV: pulses palpable throughout    Lab Results: Basic Metabolic Panel:  Recent Labs Lab 10/17/13 1304  NA 124*  K 4.7  CL 97  GLUCOSE 91  BUN 35*  CREATININE 1.30*    Liver Function Tests: No results found for this basename: AST, ALT, ALKPHOS, BILITOT, PROT, ALBUMIN,  in the last 168 hours No results found for this basename: LIPASE, AMYLASE,  in the last 168 hours No results found for this basename: AMMONIA,  in the last 168 hours  CBC:  Recent Labs Lab 10/17/13 1304  HGB 12.9  HCT 38.0    Cardiac Enzymes: No results found for this basename: CKTOTAL, CKMB, CKMBINDEX, TROPONINI,  in the last 168 hours  Lipid Panel: No results found for this basename: CHOL, TRIG, HDL, CHOLHDL, VLDL, LDLCALC,  in the last 168 hours  CBG: No results found for this basename: GLUCAP,  in the last 168 hours  Microbiology: Results for orders placed during the hospital encounter of 07/12/13  URINE CULTURE     Status: None    Collection Time    07/12/13 12:15 PM      Result Value Ref Range Status   Specimen Description URINE, CATHETERIZED   Final  Special Requests NONE   Final   Culture  Setup Time     Final   Value: 07/12/2013 20:13  DEMOGRAPHIC UPDATE OCCURRED ON 07/28 AT 1253, QA FLAGS AND RANGES MAY NO LONGER BE VALID     Performed at [facility] Count     Final   Value: NO GROWTH DEMOGRAPHIC UPDATE OCCURRED ON 07/28 AT 1253, QA FLAGS AND RANGES MAY NO LONGER BE VALID     Performed at [facility]   Culture     Final   Value: NO GROWTH DEMOGRAPHIC UPDATE OCCURRED ON 07/28 AT 1253, QA FLAGS AND RANGES MAY NO LONGER BE VALID     Performed at [facility]   Report Status     Final   Value: 07/13/2013 FINAL DEMOGRAPHIC UPDATE OCCURRED ON 07/28 AT 1253, QA FLAGS AND RANGES MAY NO LONGER BE VALID    Coagulation Studies: No results found for this basename: LABPROT, INR,  in the last 72 hours  Imaging: Ct Head Wo Contrast  10/17/2013   CLINICAL DATA:  Right-sided weakness. Slurred speech since 8:30 a.m.  EXAM: CT HEAD WITHOUT CONTRAST  TECHNIQUE: Contiguous axial images were obtained from the base of the skull through the vertex without intravenous contrast.  COMPARISON:  07/12/2013.  06/09/2012.  08/27/2011.  FINDINGS: No intra-axial or extra-axial pathologic fluid or blood collection. Diffuse cerebral atrophy and white matter changes consistent with chronic ischemia. No acute bony abnormality. Visualized paranasal sinuses and mastoids are clear.  IMPRESSION: Chronic ischemic change and atrophy. No acute abnormality . Critical Value/emergent results were called by telephone at the time of interpretation on 10/17/2013 at 1:01 pm to Dr. Ross Marcus , who verbally acknowledged these results.   Electronically Signed   By: Maisie Fus  Register   On: 10/17/2013 13:02       Assessment and plan discussed with with attending physician and they are in agreement.    Felicie Morn  PA-C [facility] [phone]  10/17/2013, 1:09 PM   Assessment: 80 y.o. female presenting to ED as a code stroke.  patient was not a tPA candidate due to LKW last night and awaking with symptoms. Exam shows right facial droop, dysarthria, dysconjugate gaze. I strongly suspect small brainstem infarct.   Stroke Risk Factors - hyperlipidemia and hypertension  1) MOST form indicates comfort only with no hospitalization or antibiotics. Care/evaluation only to the extent of patient/family wishes.  2) daily ASA is reasonable  3) Speech therapy for counsel regarding non-invasive safety measures may be beneficial.    Ritta Slot, MD [facility] [phone]  If 7pm- 7am, please page neurology on call as listed in AMION.

## 2013-10-17 NOTE — ED Notes (Signed)
Pt placed on monitor upon arrival to room. Pt monitored by blood pressure, pulse ox, and 12 lead. Pts EKG given to and signed by Dr. Wilkie Aye

## 2013-10-17 NOTE — ED Notes (Signed)
Family at bedside, speaking with neurology and EDP

## 2013-10-17 NOTE — ED Notes (Signed)
Dr. Wilkie Aye called [facility].  Pts. Last seen normal was 0830 Am.  When nurse went to give meds ~ 0915, pt. Had slurred speech.  Since pt. Is DNR/DNI, [facility]'s policy is to call family.  Family did not want her to go. However, when family went to [facility], they asked that pt. Come to [facility].

## 2013-10-17 NOTE — Code Documentation (Signed)
80yo female arriving to [facility] via [facility] with s/s of stroke.  LKW unknown per EMS.  EDP Horton assessed patient to have facial droop and dysarthria.  EDP called [facility] nursing facility and staff reports that a nursing aid took the patient her tray at 0830 and she was speaking normally.  Code stroke called in ED.  Patient taken to CT.  Stroke team to bedside.  NIHSS 9, see documentation for details and code stroke times.  Neurologist at bedside.  Patient with dysarthria, but able to report that she noticed difficulty swallowing with her breakfast this morning.  LKW yesterday, 10/16/2013.  Patient is outside the window for treatment with tPA.  No acute stroke treatment at this time per Dr. Amada Jupiter.  Bedside handoff with ED RN Brett Canales.

## 2013-10-17 NOTE — Discharge Instructions (Signed)
You had a stroke.  Given your goals of care, you have been referred to hospice.  THE DOCTOR AT [facility] WILL NEED TO PLACE HOSPICE REFERRAL.     Ischemic Stroke A stroke (cerebrovascular accident) is the sudden death of brain tissue. It is a medical emergency. A stroke can cause permanent loss of brain function. This can cause problems with different parts of your body. A transient ischemic attack (TIA) is different because it does not cause permanent damage. A TIA is a short-lived problem of poor blood flow affecting a part of the brain. A TIA is also a serious problem because having a TIA greatly increases the chances of having a stroke. When symptoms first develop, you cannot know if the problem might be a stroke or a TIA. CAUSES  A stroke is caused by a decrease of oxygen supply to an area of your brain. It is usually the result of a small blood clot or collection of cholesterol or fat (plaque) that blocks blood flow in the brain. A stroke can also be caused by blocked or damaged carotid arteries.  RISK FACTORS  High blood pressure (hypertension).  High cholesterol.  Diabetes mellitus.  Heart disease.  The buildup of plaque in the blood vessels (peripheral artery disease or atherosclerosis).  The buildup of plaque in the blood vessels providing blood and oxygen to the brain (carotid artery stenosis).  An abnormal heart rhythm (atrial fibrillation).  Obesity.  Smoking.  Taking oral contraceptives (especially in combination with smoking).  Physical inactivity.  A diet high in fats, salt (sodium), and calories.  Alcohol use.  Use of illegal drugs (especially cocaine and methamphetamine).  Being African American.  Being over the age of 43.  Family history of stroke.  Previous history of blood clots, stroke, TIA, or heart attack.  Sickle cell disease. SYMPTOMS  These symptoms usually develop suddenly, or may be newly present upon awakening from sleep:  Sudden  weakness or numbness of the face, arm, or leg, especially on one side of the body.  Sudden trouble walking or difficulty moving arms or legs.  Sudden confusion.  Sudden personality changes.  Trouble speaking (aphasia) or understanding.  Difficulty swallowing.  Sudden trouble seeing in one or both eyes.  Double vision.  Dizziness.  Loss of balance or coordination.  Sudden severe headache with no known cause.  Trouble reading or writing. DIAGNOSIS  Your health care provider can often determine the presence or absence of a stroke based on your symptoms, history, and physical exam. Computed tomography (CT) of the brain is usually performed to confirm the stroke, determine causes, and determine stroke severity. Other tests may be done to find the cause of the stroke. These tests may include:  Electrocardiography.  Continuous heart monitoring.  Echocardiography.  Carotid ultrasonography.  Magnetic resonance imaging (MRI).  A scan of the brain circulation.  Blood tests. PREVENTION  The risk of a stroke can be decreased by appropriately treating high blood pressure, high cholesterol, diabetes, heart disease, and obesity and by quitting smoking, limiting alcohol, and staying physically active. TREATMENT  Time is of the essence. It is important to seek treatment at the first sign of these symptoms because you may receive a medicine to dissolve the clot (thrombolytic) that cannot be given if too much time has passed since your symptoms began. Even if you do not know when your symptoms began, get treatment as soon as possible as there are other treatment options available including oxygen, intravenous (IV) fluids,  and medicines to thin the blood (anticoagulants). Treatment of stroke depends on the duration, severity, and cause of your symptoms. Medicines and dietary changes may be used to address diabetes, high blood pressure, and other risk factors. Physical, speech, and occupational  therapists will assess you and work with you to improve any functions impaired by the stroke. Measures will be taken to prevent short-term and long-term complications, including infection from breathing foreign material into the lungs (aspiration pneumonia), blood clots in the legs, bedsores, and falls. Rarely, surgery may be needed to remove large blood clots or to open up blocked arteries. HOME CARE INSTRUCTIONS   Take medicines only as directed by your health care provider. Follow the directions carefully. Medicines may be used to control risk factors for a stroke. Be sure you understand all your medicine instructions.  You may be told to take a medicine to thin the blood, such as aspirin or the anticoagulant warfarin. Warfarin needs to be taken exactly as instructed.  Too much and too little warfarin are both dangerous. Too much warfarin increases the risk of bleeding. Too little warfarin continues to allow the risk for blood clots. While taking warfarin, you will need to have regular blood tests to measure your blood clotting time. These blood tests usually include both the PT and INR tests. The PT and INR results allow your health care provider to adjust your dose of warfarin. The dose can change for many reasons. It is critically important that you take warfarin exactly as prescribed, and that you have your PT and INR levels drawn exactly as directed.  Many foods, especially foods high in vitamin K, can interfere with warfarin and affect the PT and INR results. Foods high in vitamin K include spinach, kale, broccoli, cabbage, collard and turnip greens, brussels sprouts, peas, cauliflower, seaweed, and parsley, as well as beef and pork liver, green tea, and soybean oil. You should eat a consistent amount of foods high in vitamin K. Avoid major changes in your diet, or notify your health care provider before changing your diet. Arrange a visit with a dietitian to answer your questions.  Many  medicines can interfere with warfarin and affect the PT and INR results. You must tell your health care provider about any and all medicines you take. This includes all vitamins and supplements. Be especially cautious with aspirin and anti-inflammatory medicines. Do not take or discontinue any prescribed or over-the-counter medicine except on the advice of your health care provider or pharmacist.  Warfarin can have side effects, such as excessive bruising or bleeding. You will need to hold pressure over cuts for longer than usual. Your health care provider or pharmacist will discuss other potential side effects.  Avoid sports or activities that may cause injury or bleeding.  Be mindful when shaving, flossing your teeth, or handling sharp objects.  Alcohol can change the body's ability to handle warfarin. It is best to avoid alcoholic drinks or consume only very small amounts while taking warfarin. Notify your health care provider if you change your alcohol intake.  Notify your dentist or other health care providers before procedures.  If swallow studies have determined that your swallowing reflex is present, you should eat healthy foods. Including 5 or more servings of fruits and vegetables a day may reduce the risk of stroke. Foods may need to be a certain consistency (soft or pureed), or small bites may need to be taken in order to avoid aspirating or choking. Certain dietary changes may be  advised to address high blood pressure, high cholesterol, diabetes, or obesity.  Food choices that are low in sodium, saturated fat, trans fat, and cholesterol are recommended to manage high blood pressure.  Food choies that are high in fiber, and low in saturated fat, trans fat, and cholesterol may control cholesterol levels.  Controlling carbohydrates and sugar intake is recommended to manage diabetes.  Reducing calorie intake and making food choices that are low in sodium, saturated fat, trans fat, and  cholesterol are recommended to manage obesity.  Maintain a healthy weight.  Stay physically active. It is recommended that you get at least 30 minutes of activity on all or most days.  Do not use any tobacco products including cigarettes, chewing tobacco, or electronic cigarettes.  Limit alcohol use even if you are not taking warfarin. Moderate alcohol use is considered to be:  No more than 2 drinks each day for men.  No more than 1 drink each day for nonpregnant women.  Home safety. A safe home environment is important to reduce the risk of falls. Your health care provider may arrange for specialists to evaluate your home. Having grab bars in the bedroom and bathroom is often important. Your health care provider may arrange for equipment to be used at home, such as raised toilets and a seat for the shower.  Physical, occupational, and speech therapy. Ongoing therapy may be needed to maximize your recovery after a stroke. If you have been advised to use a walker or a cane, use it at all times. Be sure to keep your therapy appointments.  Follow all instructions for follow-up with your health care provider. This is very important. This includes any referrals, physical therapy, rehabilitation, and lab tests. Proper follow-up can prevent another stroke from occurring. SEEK MEDICAL CARE IF:  You have personality changes.  You have difficulty swallowing.  You are seeing double.  You have dizziness.  You have a fever.  You have skin breakdown. SEEK IMMEDIATE MEDICAL CARE IF:  Any of these symptoms may represent a serious problem that is an emergency. Do not wait to see if the symptoms will go away. Get medical help right away. Call your local emergency services (911 in [place] Do not drive yourself to the hospital.  You have sudden weakness or numbness of the face, arm, or leg, especially on one side of the body.  You have sudden trouble walking or difficulty moving arms or legs.  You  have sudden confusion.  You have trouble speaking (aphasia) or understanding.  You have sudden trouble seeing in one or both eyes.  You have a loss of balance or coordination.  You have a sudden, severe headache with no known cause.  You have new chest pain or an irregular heartbeat.  You have a partial or total loss of consciousness. Document Released: 12/30/2004 Document Revised: 05/16/2013 Document Reviewed: 08/10/2011 [facility] Patient Information 2015 [employer], [place]. This information is not intended to replace advice given to you by your health care provider. Make sure you discuss any questions you have with your health care provider. Hyponatremia  Hyponatremia is when the amount of salt (sodium) in your blood is too low. When sodium levels are low, your cells will absorb extra water and swell. The swelling happens throughout the body, but it mostly affects the brain. Severe brain swelling (cerebral edema), seizures, or coma can happen.  CAUSES   Heart, kidney, or liver problems.  Thyroid problems.  Adrenal gland problems.  Severe vomiting and diarrhea.  Certain medicines or illegal drugs.  Dehydration.  Drinking too much water.  Low-sodium diet. SYMPTOMS   Nausea and vomiting.  Confusion.  Lethargy.  Agitation.  Headache.  Twitching or shaking (seizures).  Unconsciousness.  Appetite loss.  Muscle weakness and cramping. DIAGNOSIS  Hyponatremia is identified by a simple blood test. Your caregiver will perform a history and physical exam to try to find the cause and type of hyponatremia. Other tests may be needed to measure the amount of sodium in your blood and urine. TREATMENT  Treatment will depend on the cause.   Fluids may be given through the vein (IV).  Medicines may be used to correct the sodium imbalance. If medicines are causing the problem, they will need to be adjusted.  Water or fluid intake may be restricted to restore proper  balance. The speed of correcting the sodium problem is very important. If the problem is corrected too fast, nerve damage (sometimes unchangeable) can happen. HOME CARE INSTRUCTIONS   Only take medicines as directed by your caregiver. Many medicines can make hyponatremia worse. Discuss all your medicines with your caregiver.  Carefully follow any recommended diet, including any fluid restrictions.  You may be asked to repeat lab tests. Follow these directions.  Avoid alcohol and recreational drugs. SEEK MEDICAL CARE IF:   You develop worsening nausea, fatigue, headache, confusion, or weakness.  Your original hyponatremia symptoms return.  You have problems following the recommended diet. SEEK IMMEDIATE MEDICAL CARE IF:   You have a seizure.  You faint.  You have ongoing diarrhea or vomiting. MAKE SURE YOU:   Understand these instructions.  Will watch your condition.  Will get help right away if you are not doing well or get worse. Document Released: 12/20/2001 Document Revised: 03/24/2011 Document Reviewed: 06/16/2010 [facility] Patient Information 2015 [place], [place]. This information is not intended to replace advice given to you by your health care provider. Make sure you discuss any questions you have with your health care provider.

## 2013-10-17 NOTE — ED Notes (Signed)
Per EMS: From [facility].  Staff discovered that patient had slurred speech, right facial droop, and right sided weakness at 0915 this am when passing medications.  Patient had food tray delivered to room earlier but it is unclear if symptoms were present at this time so LSN time is unknown.  Family denies previous stroke history.  Patient is alert and oriented at this time but is having difficulty handling secretions. Dr. Wilkie Aye at bedside

## 2013-10-17 NOTE — ED Notes (Signed)
CALLED PTR TO COME TRANSPORT PT BACK TO [facility].

## 2013-10-17 NOTE — ED Provider Notes (Signed)
CSN: [id number]     Arrival date & time 10/17/13  1224 History   First MD Initiated Contact with Patient 10/17/13 1225     Chief Complaint  Patient presents with  . Code Stroke    last seen normal 0830     (Consider location/radiation/quality/duration/timing/severity/associated sxs/prior Treatment) HPI  12:30 PM patient evaluated as possible CVA. Per EMS, last seen normal unknown. Patient was discovered at approximate 9/15 of slurred speech, right facial droop and right-sided weakness.  Patient is awake and alert. She does appear to have difficulty tolerating her secretions. She is oriented x3. She reports pain all over.  No dysphasia noted; however, there is moderate dysarthria as well as right-sided facial droop and right upper extremity drift with 4/5 strength. Difficult to access lower extremities. Patient with history of hypertension, hyperlipidemia, cardiomyopathy, peripheral vascular disease. No known history of strokes. Per patient chart, patient is DO NOT RESUSCITATE/DO NOT INTUBATE.  12:38 PM I spoke with the patient's primary nurse at [facility]. Per the nurse, the patient was last seen normal at 8:30 AM by the nursing 80 delivered her breakfast. Per the nursing aid, patient was conversant and at her baseline without any obvious deficits. When the nurse administer the patient's medications at 9:15 AM, the nurse noted difficulty with patient swallowing and right-sided facial droop. Patient was noted to be DO NOT RESUSCITATE/DO NOT INTUBATE and per policy the family was notified. They initially elected to not have the patient transported; however, upon family arrival, they wished to have her transported for further evaluation.  Given new information regarding patient's last seen normal time, code stroke was initiated as she is within the window.  Level V caveat for acuity of condition  Past Medical History  Diagnosis Date  . Hypertension   . Hyperlipidemia   . Cardiomyopathy      a. EF 45% by nuc 2002. b. EF normal 2014.  Marland Kitchen Complete heart block     a. s/p pacemaker 2001, last gen change [employer] in 2008.  Marland Kitchen PVD (peripheral vascular disease) 2007    a. s/p stenting bilateral legs 2007,   . Allergic rhinitis   . GERD (gastroesophageal reflux disease)   . Osteoporosis   . Osteoarthritis   . Gallstones     per [place] 11-2008, also no AAA  . 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 Dementia   . Multiple falls   . Anemia of chronic disease   . Hyponatremia   . Failure to thrive in adult   . Hyperkalemia    Past Surgical History  Procedure Laterality Date  . Pacemaker insertion      permanent   . Breast biopsy benign     Family History  Problem Relation Age of Onset  . Hypertension    . Breast cancer Neg Hx   . Colon cancer Neg Hx    History  Substance Use Topics  . Smoking status: Former Games developer  . Smokeless tobacco: Never Used  . Alcohol Use: No   OB History   Grav Para Term Preterm Abortions TAB SAB Ect Mult Living                 Review of Systems  Unable to perform ROS: Acuity of condition      Allergies  Amlodipine; Hydrocodone; Penicillins; and Vicodin  Home Medications   Prior to Admission medications   Medication Sig Start Date End Date Taking? Authorizing Provider  acetaminophen (TYLENOL) 325  MG tablet Take 650 mg by mouth every 12 (twelve) hours.     Historical Provider, MD  acetaminophen (TYLENOL) 325 MG tablet Take 650 mg by mouth every 6 (six) hours as needed for mild pain or fever.    Historical Provider, MD  aspirin EC 81 MG tablet Take 81 mg by mouth every morning.     Historical Provider, MD  Bepotastine Besilate (BEPREVE OP) Place 1 drop into both eyes 2 (two) times daily.    Historical Provider, MD  calcium-vitamin D (OSCAL WITH D) 500-200 MG-UNIT per tablet Take 1 tablet by mouth every morning.     Historical Provider, MD  cyanocobalamin 500 MCG tablet Take 1,000 mcg by mouth every  morning.     Historical Provider, MD  diclofenac sodium (VOLTAREN) 1 % GEL Place 4 g onto the skin 4 (four) times daily. Bilateral knees    Historical Provider, MD  divalproex (DEPAKOTE SPRINKLE) 125 MG capsule Take 125 mg by mouth at bedtime.    Historical Provider, MD  fexofenadine (ALLEGRA) 180 MG tablet Take 180 mg by mouth every morning.     Historical Provider, MD  LORazepam (ATIVAN) 0.5 MG tablet Take 0.5 mg by mouth every 12 (twelve) hours as needed for anxiety.    Historical Provider, MD  Memantine HCl ER (NAMENDA XR) 28 MG CP24 Take 28 mg by mouth every morning.     Historical Provider, MD  Nutritional Supplements (NUTRITIONAL DRINK) LIQD Take 120 mLs by mouth 4 (four) times daily. "2 Cal"    Historical Provider, MD  ondansetron (ZOFRAN) 4 MG tablet Take 4 mg by mouth every 6 (six) hours as needed for nausea or vomiting.    Historical Provider, MD  polyethylene glycol (MIRALAX / GLYCOLAX) packet Take 17 g by mouth every other day.     Historical Provider, MD  Propylene Glycol 0.6 % SOLN Place 1 drop into both eyes 3 (three) times daily.    Historical Provider, MD   BP 157/61  Pulse 71  Temp(Src) 97.6 F (36.4 C) (Oral)  Resp 20  Ht 5\' 3"  (1.6 m)  Wt 175 lb (79.379 kg)  BMI 31.01 kg/m2  SpO2 98% Physical Exam  Nursing note and vitals reviewed. Constitutional: She is oriented to person, place, and time. No distress.  Elderly  HENT:  Head: Normocephalic and atraumatic.  Mouth/Throat: Oropharynx is clear and moist.  Eyes: Pupils are equal, round, and reactive to light.  Disconjugate gaze  Neck: Normal range of motion. Neck supple.  Cardiovascular: Normal rate, regular rhythm and normal heart sounds.   No murmur heard. Pulmonary/Chest: Effort normal and breath sounds normal. No respiratory distress. She has no wheezes.  Abdominal: Soft. Bowel sounds are normal. There is no tenderness. There is no rebound.  Neurological: She is alert and oriented to person, place, and time.   Dysarthria noted, right facial droop, right upper extremity drift  Skin: Skin is warm and dry.  Psychiatric: She has a normal mood and affect.    ED Course  Procedures (including critical care time) Labs Review Labs Reviewed  CBC - Abnormal; Notable for the following:    RBC 3.66 (*)    Hemoglobin 11.6 (*)    HCT 33.4 (*)    All other components within normal limits  DIFFERENTIAL - Abnormal; Notable for the following:    Monocytes Relative 13 (*)    Eosinophils Relative 9 (*)    All other components within normal limits  COMPREHENSIVE METABOLIC PANEL - Abnormal;  Notable for the following:    Sodium 127 (*)    Chloride 91 (*)    BUN 34 (*)    Creatinine, Ser 1.25 (*)    GFR calc non Af Amer 37 (*)    GFR calc Af Amer 43 (*)    All other components within normal limits  I-STAT CHEM 8, ED - Abnormal; Notable for the following:    Sodium 124 (*)    BUN 35 (*)    Creatinine, Ser 1.30 (*)    Calcium, Ion 1.12 (*)    All other components within normal limits  ETHANOL  PROTIME-INR  APTT  I-STAT TROPOININ, ED  I-STAT TROPOININ, ED    Imaging Review Ct Head Wo Contrast  10/17/2013   CLINICAL DATA:  Right-sided weakness. Slurred speech since 8:30 a.m.  EXAM: CT HEAD WITHOUT CONTRAST  TECHNIQUE: Contiguous axial images were obtained from the base of the skull through the vertex without intravenous contrast.  COMPARISON:  07/12/2013.  06/09/2012.  08/27/2011.  FINDINGS: No intra-axial or extra-axial pathologic fluid or blood collection. Diffuse cerebral atrophy and white matter changes consistent with chronic ischemia. No acute bony abnormality. Visualized paranasal sinuses and mastoids are clear.  IMPRESSION: Chronic ischemic change and atrophy. No acute abnormality . Critical Value/emergent results were called by telephone at the time of interpretation on 10/17/2013 at 1:01 pm to Dr. Ross Marcus , who verbally acknowledged these results.   Electronically Signed   By: Maisie Fus   Register   On: 10/17/2013 13:02     EKG Interpretation   Date/Time:  Monday October 17 2013 12:22:27 EDT Ventricular Rate:  68 PR Interval:  339 QRS Duration: 180 QT Interval:  451 QTC Calculation: 480 R Axis:   -73 Text Interpretation:  Sinus rhythm Prolonged PR interval Nonspecific IVCD  with LAD LVH with secondary repolarization abnormality Inferior infarct,  old Anterolateral infarct, age indeterminate Similar to prior Confirmed by  Yizel Canby  MD, Sukanya Goldblatt ([id number]) on 10/17/2013 12:31:57 PM      MDM   Final diagnoses:  Stroke    Patient presents following what appears to be in acute stroke. Last seen normal at 8:30 AM. Given this, code stroke was initiated. However, upon further review the patient's chart, patient has signed DO NOT RESUSCITATE/DO NOT INTUBATE that also includes order for comfort care and no antibiotics if needed. She is out of the window for TPA. Family is at bedside and agree with the patient would not want a PEG tube further intervention to prolong her life. CT is negative. MRI deferred at this time as patient clinically has evidence of an acute stroke. She appears to have some difficulty tolerating her secretions. Patient evaluated by case management has arranged for hospice referral. Otherwise patient workup during stroke about notable for hyponatremia and mild AKI.  Given goals of care, will not aggressively treat at this time. Patient will be discharged back to [facility]. Hospice referral to be made by Dr[facility] at [facility] per case management. Family was updated at the bedside.  After history, exam, and medical workup I feel the patient has been appropriately medically screened and is safe for discharge home. Pertinent diagnoses were discussed with the patient. Patient was given return precautions.     Shon Baton, MD 10/17/13 [phone]

## 2013-10-17 NOTE — Progress Notes (Signed)
  CARE MANAGEMENT ED NOTE 10/17/2013  Patient:  [name], [name]   Account Number:  [id number]  Date Initiated:  10/17/2013  Documentation initiated by:  Ferdinand Cava  Subjective/Objective Assessment:   80 yo presenting to the ED from [facility] via EMS with stroke like symptoms     Subjective/Objective Assessment Detail:     Action/Plan:   Patient will follow up at [facility] for hospice consult by facility PCP   Action/Plan Detail:   Anticipated DC Date:       Status Recommendation to Physician:   Result of Recommendation:  Agreed    DC Planning Services  CM consult  Other    Choice offered to / List presented to:  C-2 HC POA / Guardian          Status of service:  Completed, signed off  ED Comments:   ED Comments Detail:  CM consulted to assist with EOL care in the facility. This CM spoke with the patient's niece, [name], and great niece, [name], as the patient was not verbal. The family stated that the patient is a DNR and they no longer want any aggressive treatment for the patient and wish to pursue hospice and palliative care in the facility. The family stated that they are familiar with hospice and palliative care as the niece's spouse received hospice care 2 years ago with the [facility] agency. They stated that they were satisfied with hospice of [facility] and would like to use their services again. This CM then contacted the referral center for [facility] and spoke with [name]. She then contacted the facility for the referral and was informed that the patient has to be present in the facility to receive an MD order from the PCP. This CM then followed up with the [facility] and spoke with [name] the 2nd floor supervisor and this CM communicated the patient family's request for hospice care in the facility and explained that the EDP will write an outpatient hospice order to be followed up by the facility PCP. This  CM contacted [name] at [facility] referral center and explained that the request for [facility] consult was communicated with [name] at the facility. [name] stated that she will follow up with the facility tomorrow for a referral order. This Cm communicated to the family that the patient needs to be present in the facility for the PCP to consult [facility]. Explained to [facility] that if she is not contacted by [facility] tomorrow to follow up with [facility] referral center and the number was provided. This information was provided to the EDP. No other CM needs identified at this time.

## 2013-10-17 NOTE — ED Notes (Addendum)
Comfort care initiated.

## 2013-11-23 ENCOUNTER — Encounter: Payer: Self-pay | Admitting: *Deleted

## 2013-11-23 ENCOUNTER — Encounter: Payer: Self-pay | Admitting: Internal Medicine

## 2013-11-23 ENCOUNTER — Non-Acute Institutional Stay (SKILLED_NURSING_FACILITY): Payer: Medicare Other | Admitting: Internal Medicine

## 2013-11-23 DIAGNOSIS — F03918 Unspecified dementia, unspecified severity, with other behavioral disturbance: Secondary | ICD-10-CM

## 2013-11-23 DIAGNOSIS — I10 Essential (primary) hypertension: Secondary | ICD-10-CM

## 2013-11-23 DIAGNOSIS — J309 Allergic rhinitis, unspecified: Secondary | ICD-10-CM

## 2013-11-23 DIAGNOSIS — F0391 Unspecified dementia with behavioral disturbance: Secondary | ICD-10-CM

## 2013-11-23 DIAGNOSIS — B351 Tinea unguium: Secondary | ICD-10-CM

## 2013-11-23 DIAGNOSIS — M170 Bilateral primary osteoarthritis of knee: Secondary | ICD-10-CM

## 2013-11-23 NOTE — Progress Notes (Signed)
Patient ID: [name], female   DOB: [birth year], 80 y.o.   MRN: [id number]   Place of Service: [facility]  Allergies  Allergen Reactions  . Amlodipine Other (See Comments)    Dizziness & elevated BP  . Hydrocodone Itching  . Penicillins Other (See Comments)    Per MAR  . Vicodin [Hydrocodone-Acetaminophen] Other (See Comments)    Per MAR    Code Status: DNR  Goals of Care:Comfort and Quality of Life/Long term care  Chief Complaint  Patient presents with  . Medical Management of Chronic Issues    Dementia, HTN, OA, allergic rhinitis     HPI 80 y.o. female with PMH of dementia with behavioral disturbance, HTN, OA, allergic rhinitis, diabetes among others is being seen for a routine visit. Weight stable. No change in behaviors or functional status. No recent falls or change in functional status. No concerns from nursing staff. Patient reported having R arm pain from OA and want to know if she could get pain med. Otherwise, no additional concerns reported.   Review of Systems Constitutional: Negative for fever and chills HENT: Negative for congestion, and sore throat Eyes: Negative for eye pain and visual disturbance  Cardiovascular: Negative for chest pain, palpitations, and leg swelling. Positive for pacemaker Respiratory: Negative cough and shortness of breath Gastrointestinal: . Negative for abdominal pain, diarrhea and constipation.  Musculoskeletal: Negative for back pain. Positive for R arm pain Neurological: Negative for dizziness and headache Skin: Negative for rash and wound.   Psychiatric: Negative for nervous/anxious, agitation, and depression.   Past Medical History  Diagnosis Date  . Hypertension   . Hyperlipidemia   . Cardiomyopathy     a. EF 45% by nuc 2002. b. EF normal 2014.  Marland Kitchen Complete heart block     a. s/p pacemaker 2001, last gen change [employer] in 2008.  Marland Kitchen PVD (peripheral vascular disease) 2007    a. s/p stenting bilateral  legs 2007,   . Allergic rhinitis   . GERD (gastroesophageal reflux disease)   . Osteoporosis   . Osteoarthritis   . Gallstones     per [place] 11-2008, also no AAA  . 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 Dementia   . Multiple falls   . Anemia of chronic disease   . Hyponatremia   . Failure to thrive in adult   . Hyperkalemia     Past Surgical History  Procedure Laterality Date  . Pacemaker insertion      permanent   . Breast biopsy benign      History   Social History  . Marital Status: Widowed    Spouse Name: N/A    Number of Children: N/A  . Years of Education: N/A   Occupational History  . retired    Social History Main Topics  . Smoking status: Former Games developer  . Smokeless tobacco: Never Used  . Alcohol Use: No  . Drug Use: No  . Sexual Activity: No   Other Topics Concern  . Not on file   Social History Narrative   Comes to the office frequently with her nephew [name]...   No children but has family around...Marland KitchenMarland KitchenMarland Kitchen   End of life issues discussed  Today 05-16-10, again she does not like heroic measures, intubation or CPR.      Medication List       This list is accurate as of: 11/23/13  5:27 PM.  Always use your most recent med  list.               acetaminophen 325 MG tablet  Commonly known as:  TYLENOL  Take 650 mg by mouth See admin instructions. Give 2 tablets twice daily, may also given an additional 2 tablets every 6 hours as needed for pain or fever     aspirin EC 81 MG tablet  Take 81 mg by mouth daily.     BEPREVE OP  Place 1 drop into both eyes 2 (two) times daily.     calcium-vitamin D 500-200 MG-UNIT per tablet  Commonly known as:  OSCAL WITH D  Take 1 tablet by mouth daily.     cyanocobalamin 500 MCG tablet  Take 1,000 mcg by mouth daily.     diclofenac sodium 1 % Gel  Commonly known as:  VOLTAREN  Place 4 g onto the skin 4 (four) times daily. Bilateral knees     divalproex 125 MG capsule    Commonly known as:  DEPAKOTE SPRINKLE  Take 125 mg by mouth at bedtime. For mood disorder     fexofenadine 180 MG tablet  Commonly known as:  ALLEGRA  Take 180 mg by mouth daily.     LORazepam 0.5 MG tablet  Commonly known as:  ATIVAN  Take 0.5 mg by mouth every 12 (twelve) hours as needed for anxiety.     NAMENDA XR 28 MG Cp24  Generic drug:  Memantine HCl ER  Take 28 mg by mouth daily.     NUTRITIONAL DRINK Liqd  Take 120 mLs by mouth 4 (four) times daily. "2 Cal"     ondansetron 4 MG tablet  Commonly known as:  ZOFRAN  Take 4 mg by mouth every 6 (six) hours as needed for nausea or vomiting.     polyethylene glycol packet  Commonly known as:  MIRALAX / GLYCOLAX  Take 17 g by mouth every other day. Mix in 6 oz of water     Propylene Glycol 0.6 % Soln  Place 1 drop into both eyes 3 (three) times daily.     traMADol 50 MG tablet  Commonly known as:  ULTRAM  Take 50 mg by mouth every 6 (six) hours as needed (pain).        Physical Exam Filed Vitals:   11/23/13 1135  BP: 126/64  Pulse: 66  Temp: 98.4 F (36.9 C)  Resp: 18   Constitutional: WDWN elderly female in no acute distress. Conversant and pleasant. HEENT: Normocephalic and atraumatic. PERRL. EOM intact. No icterus. Oral mucosa moist. Posterior pharynx clear of any exudate or lesions.  Neck: Supple and nontender. No lymphadenopathy, masses, or thyromegaly. No JVD or carotid bruits. Cardiac: Normal S1, S2. RRR without appreciable murmurs, rubs, or gallops. Distal pulses intact. No dependent edema.  Lungs: No respiratory distress. Breath sounds clear bilaterally without rales, rhonchi, or wheezes. Abdomen: Audible bowel sounds in all quadrants. Soft, nontender, nondistended. No palpable mass.  Musculoskeletal: Able to move all extremities. Pain with ROM and palpation of R arm.  Skin: Warm and dry. No rash noted. No erythema. Bil feet crusty and onychomycotic toes nails Neurological: Alert and oriented to person  and place.  Psychiatric:. Appropriate mood and affect.   Labs Reviewed CBC Latest Ref Rng 10/17/2013 10/17/2013 07/13/2013  WBC 4.0 - 10.5 K/uL - 6.1 10.3  Hemoglobin 12.0 - 15.0 g/dL 16.112.9 11.6(L) 12.7  Hematocrit 36.0 - 46.0 % 38.0 33.4(L) 37.4  Platelets 150 - 400 K/uL - 199 206  CMP     Component Value Date/Time   NA 124* 10/17/2013 1304   K 4.7 10/17/2013 1304   CL 97 10/17/2013 1304   CO2 23 10/17/2013 1259   GLUCOSE 91 10/17/2013 1304   GLUCOSE 83 08/31/2009 1451   BUN 35* 10/17/2013 1304   CREATININE 1.30* 10/17/2013 1304   CALCIUM 9.2 10/17/2013 1259   PROT 7.7 10/17/2013 1259   ALBUMIN 3.7 10/17/2013 1259   AST 21 10/17/2013 1259   ALT 8 10/17/2013 1259   ALKPHOS 81 10/17/2013 1259   BILITOT 0.4 10/17/2013 1259   GFRNONAA 37* 10/17/2013 1259   GFRAA 43* 10/17/2013 1259    Lab Results  Component Value Date   HGBA1C 5.3 06/09/2012    Assessment & Plan 1. Essential hypertension, benign Stable. Diet controlled. Continue no added salt diet, puree texture with thickened liquid nectar consistency. Continue to monitor.   2. Dementia with behavioral disturbance Stable. Continue namenda x4 28mg  daily, depakote 125mg  daily at bed time, ativan 0.5mg  Q12H PRN agitation. Will check depakote level. Continue to monitor for change in behaviors. Continue fall and pressure ulcer prophylaxis.  3. Osteoarthritis of both knees, unspecified osteoarthritis type Stable. Continue voltaren 1% 4 g transdermally QID, tylenol 650mg  BID and 650mg  Q6H PRN, and tramadol 50mg  Q6H PRN. Continue to monitor.   4. Allergic rhinitis, unspecified allergic rhinitis type Stable. Continue allegra 180mg  daily and Bepreve eye drop in both eyes BID. Continue to monitor.  5. Onychomycosis of toenails Podiatry referral. Eucerin ointment to Bil feet BID for dry skin and monitor   Labs Ordered: A1C, depakote level  Family/Staff Communication Plan of care discuss with nursing staff. Nursing staff  verbalize understanding and agree with plan of care. No additional questions or concerns reported.    Loura Back, MSN, AGNP-C [facility] [address] [place], [place] [address] [phone] [8am-5pm] After hours: [phone]

## 2013-12-12 ENCOUNTER — Encounter: Payer: Self-pay | Admitting: Adult Health

## 2013-12-12 ENCOUNTER — Non-Acute Institutional Stay (SKILLED_NURSING_FACILITY): Payer: Medicare Other | Admitting: Adult Health

## 2013-12-12 DIAGNOSIS — N390 Urinary tract infection, site not specified: Secondary | ICD-10-CM

## 2013-12-12 NOTE — Progress Notes (Signed)
Patient ID: [name], female   DOB: 1922-07-31, 80 y.o.   MRN: [id number]  [facility]    MOST FORM: NO HOSPITALIZATION; NO FEEDING TUBE NO ABT  CODE STATUS: DNR   Allergies  Allergen Reactions  . Amlodipine Other (See Comments)    Dizziness & elevated BP  . Hydrocodone Itching  . Penicillins Other (See Comments)    Per MAR  . Vicodin [Hydrocodone-Acetaminophen] Other (See Comments)    Per [facility]       Chief Complaint  Patient presents with  . Acute Visit    follow up lab results     HPI:  Nursing staff reports that she is more confused; that she is not taking her medications and that her p intake has diminished. She had an urine culture done which does demonstrate an infection present. Her most from indicates that she is not to have further abt. I have called her family and discussed with with her. We have decided that treatment of her uti is a reasonable coarse of treatment at this time.   Past Medical History  Diagnosis Date  . Hypertension   . Hyperlipidemia   . Cardiomyopathy     a. EF 45% by nuc 2002. b. EF normal 2014.  Marland Kitchen Complete heart block     a. s/p pacemaker 2001, last gen change [employer] in 2008.  Marland Kitchen PVD (peripheral vascular disease) 2007    a. s/p stenting bilateral legs 2007,   . Allergic rhinitis   . GERD (gastroesophageal reflux disease)   . Osteoporosis   . Osteoarthritis   . Gallstones     per [place] 11-2008, also no AAA  . 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 Dementia   . Multiple falls   . Anemia of chronic disease   . Hyponatremia   . Failure to thrive in adult   . Hyperkalemia     Past Surgical History  Procedure Laterality Date  . Pacemaker insertion      permanent   . Breast biopsy benign      VITAL SIGNS BP 126/64 mmHg  Pulse 66  Ht 5\' 4"  (1.626 m)  Wt 180 lb (81.647 kg)  BMI 30.88 kg/m2   Outpatient Encounter Prescriptions as of 12/12/2013  Medication Sig  . acetaminophen (TYLENOL)  325 MG tablet Take 650 mg by mouth See admin instructions. Give 2 tablets twice daily, may also given an additional 2 tablets every 6 hours as needed for pain or fever  . aspirin EC 81 MG tablet Take 81 mg by mouth daily.   . Bepotastine Besilate (BEPREVE OP) Place 1 drop into both eyes 2 (two) times daily.  . calcium-vitamin D (OSCAL WITH D) 500-200 MG-UNIT per tablet Take 1 tablet by mouth daily.   . cyanocobalamin 500 MCG tablet Take 1,000 mcg by mouth daily.   . diclofenac sodium (VOLTAREN) 1 % GEL Place 4 g onto the skin 4 (four) times daily. Bilateral knees  . divalproex (DEPAKOTE SPRINKLE) 125 MG capsule Take 125 mg by mouth at bedtime. For mood disorder  . fexofenadine (ALLEGRA) 180 MG tablet Take 180 mg by mouth daily.   Marland Kitchen LORazepam (ATIVAN) 0.5 MG tablet Take 0.5 mg by mouth every 12 (twelve) hours as needed for anxiety.  . Memantine HCl ER (NAMENDA XR) 28 MG CP24 Take 28 mg by mouth daily.   . Nutritional Supplements (NUTRITIONAL DRINK) LIQD Take 120 mLs by mouth 4 (four) times daily. "2 Cal"  .  ondansetron (ZOFRAN) 4 MG tablet Take 4 mg by mouth every 6 (six) hours as needed for nausea or vomiting.  . polyethylene glycol (MIRALAX / GLYCOLAX) packet Take 17 g by mouth every other day. Mix in 6 oz of water  . Propylene Glycol 0.6 % SOLN Place 1 drop into both eyes 3 (three) times daily.  . traMADol (ULTRAM) 50 MG tablet Take 50 mg by mouth every 6 (six) hours as needed (pain).     SIGNIFICANT DIAGNOSTIC EXAMS  LABS REVIEWED:  07-21-13: wbc 8.0; hgb 11.1; hct 37.2; mcv 92.5; plt 172; glucose 89; bun 13.3; creat 0.83; k+4.0; na++137 09-15-13:  Glucose 90; bun 22; creat 0.93; k+5.0; na++125 09-26-13: glucose 70; bun 28.4; creat 0.8; k+5.1; na++128 12-10-13: urine culture: providencia stuartii: rocephin:     Review of Systems  Unable to perform ROS    Physical Exam  Constitutional: She appears well-developed and well-nourished. No distress.  Neck: Neck supple. No JVD present.    Cardiovascular: Normal rate, regular rhythm and intact distal pulses.   Respiratory: Breath sounds normal. No respiratory distress. She has no wheezes.  GI: Soft. Bowel sounds are normal. She exhibits no distension. There is no tenderness.  Musculoskeletal: She exhibits no edema.  Is able to move all extremities   Neurological: She is alert.  Is disoriented; confused; hallucinating   Skin: Skin is warm and dry. She is not diaphoretic.       ASSESSMENT/ PLAN:  1. UTI: after speaking with her niece Hardin NegusFlossie Jackson will being her rocephin 1 gm im daily for one week with florastor twice daily for 2 weeks as probiotic and will continue to monitor her status. Her family is aware that treatment willl require the use of im medications.     Synthia Innocenteborah Teya [facility] NP [facility]  Contact [phone] Monday through Friday 8am- 5pm  After hours call [phone]

## 2014-01-13 DEATH — deceased

## 2017-09-02 ENCOUNTER — Encounter: Payer: Self-pay | Admitting: Internal Medicine
